# Patient Record
Sex: Male | Born: 1950 | Race: White | Hispanic: No | Marital: Married | Smoking: Never smoker
Health system: Southern US, Community
[De-identification: ages and names within clinical notes are randomized; demographics above are authoritative.]

## PROBLEM LIST (undated history)

## (undated) DIAGNOSIS — M205X9 Other deformities of toe(s) (acquired), unspecified foot: Secondary | ICD-10-CM

## (undated) DIAGNOSIS — H269 Unspecified cataract: Secondary | ICD-10-CM

## (undated) DIAGNOSIS — R972 Elevated prostate specific antigen [PSA]: Secondary | ICD-10-CM

## (undated) DIAGNOSIS — N529 Male erectile dysfunction, unspecified: Secondary | ICD-10-CM

## (undated) DIAGNOSIS — G8929 Other chronic pain: Secondary | ICD-10-CM

## (undated) DIAGNOSIS — M159 Polyosteoarthritis, unspecified: Secondary | ICD-10-CM

## (undated) DIAGNOSIS — I2510 Atherosclerotic heart disease of native coronary artery without angina pectoris: Secondary | ICD-10-CM

## (undated) DIAGNOSIS — T7840XA Allergy, unspecified, initial encounter: Secondary | ICD-10-CM

## (undated) DIAGNOSIS — I9789 Other postprocedural complications and disorders of the circulatory system, not elsewhere classified: Secondary | ICD-10-CM

## (undated) DIAGNOSIS — R7301 Impaired fasting glucose: Secondary | ICD-10-CM

## (undated) DIAGNOSIS — R5383 Other fatigue: Secondary | ICD-10-CM

## (undated) DIAGNOSIS — E785 Hyperlipidemia, unspecified: Secondary | ICD-10-CM

## (undated) DIAGNOSIS — E291 Testicular hypofunction: Secondary | ICD-10-CM

## (undated) DIAGNOSIS — N400 Enlarged prostate without lower urinary tract symptoms: Secondary | ICD-10-CM

## (undated) DIAGNOSIS — K635 Polyp of colon: Secondary | ICD-10-CM

## (undated) DIAGNOSIS — I214 Non-ST elevation (NSTEMI) myocardial infarction: Secondary | ICD-10-CM

## (undated) DIAGNOSIS — R7303 Prediabetes: Secondary | ICD-10-CM

## (undated) DIAGNOSIS — I4891 Unspecified atrial fibrillation: Secondary | ICD-10-CM

## (undated) DIAGNOSIS — M1990 Unspecified osteoarthritis, unspecified site: Secondary | ICD-10-CM

## (undated) DIAGNOSIS — M25561 Pain in right knee: Principal | ICD-10-CM

## (undated) DIAGNOSIS — G4730 Sleep apnea, unspecified: Secondary | ICD-10-CM

## (undated) DIAGNOSIS — K219 Gastro-esophageal reflux disease without esophagitis: Secondary | ICD-10-CM

## (undated) HISTORY — DX: Atherosclerotic heart disease of native coronary artery without angina pectoris: I25.10

## (undated) HISTORY — DX: Hyperlipidemia, unspecified: E78.5

## (undated) HISTORY — DX: Non-ST elevation (NSTEMI) myocardial infarction: I21.4

## (undated) HISTORY — DX: Other deformities of toe(s) (acquired), unspecified foot: M20.5X9

## (undated) HISTORY — DX: Male erectile dysfunction, unspecified: N52.9

## (undated) HISTORY — PX: COLONOSCOPY: SHX174

## (undated) HISTORY — DX: Allergy, unspecified, initial encounter: T78.40XA

## (undated) HISTORY — PX: FRACTURE SURGERY: SHX138

## (undated) HISTORY — PX: TONSILLECTOMY AND ADENOIDECTOMY: SUR1326

## (undated) HISTORY — DX: Other chronic pain: G89.29

## (undated) HISTORY — DX: Pain in right knee: M25.561

## (undated) HISTORY — PX: OTHER SURGICAL HISTORY: SHX169

## (undated) HISTORY — DX: Gastro-esophageal reflux disease without esophagitis: K21.9

## (undated) HISTORY — DX: Unspecified cataract: H26.9

## (undated) HISTORY — DX: Other fatigue: R53.83

## (undated) HISTORY — PX: EYE SURGERY: SHX253

## (undated) HISTORY — PX: UPPER GI ENDOSCOPY: SHX6162

## (undated) HISTORY — DX: Unspecified osteoarthritis, unspecified site: M19.90

## (undated) HISTORY — DX: Benign prostatic hyperplasia without lower urinary tract symptoms: N40.0

## (undated) HISTORY — DX: Sleep apnea, unspecified: G47.30

## (undated) HISTORY — DX: Prediabetes: R73.03

## (undated) HISTORY — DX: Other postprocedural complications and disorders of the circulatory system, not elsewhere classified: I97.89

## (undated) HISTORY — DX: Testicular hypofunction: E29.1

## (undated) HISTORY — PX: ORBITAL FRACTURE SURGERY: SHX725

## (undated) HISTORY — DX: Unspecified atrial fibrillation: I48.91

## (undated) HISTORY — DX: Polyp of colon: K63.5

## (undated) HISTORY — DX: Polyosteoarthritis, unspecified: M15.9

## (undated) HISTORY — DX: Impaired fasting glucose: R73.01

## (undated) HISTORY — DX: Elevated prostate specific antigen (PSA): R97.20

## (undated) HISTORY — PX: CARDIOVASCULAR STRESS TEST: SHX262

---
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
## 1998-02-02 ENCOUNTER — Ambulatory Visit (HOSPITAL_COMMUNITY): Admission: RE | Admit: 1998-02-02 | Discharge: 1998-02-02 | Payer: Self-pay | Admitting: Internal Medicine

## 1998-02-07 ENCOUNTER — Ambulatory Visit (HOSPITAL_COMMUNITY): Admission: RE | Admit: 1998-02-07 | Discharge: 1998-02-07 | Payer: Self-pay | Admitting: Internal Medicine

## 1999-06-26 ENCOUNTER — Ambulatory Visit (HOSPITAL_COMMUNITY): Admission: RE | Admit: 1999-06-26 | Discharge: 1999-06-26 | Payer: Self-pay | Admitting: Internal Medicine

## 1999-06-26 ENCOUNTER — Encounter: Payer: Self-pay | Admitting: Internal Medicine

## 1999-11-10 ENCOUNTER — Encounter: Payer: Self-pay | Admitting: Emergency Medicine

## 1999-11-10 ENCOUNTER — Emergency Department (HOSPITAL_COMMUNITY): Admission: EM | Admit: 1999-11-10 | Discharge: 1999-11-11 | Payer: Self-pay | Admitting: Emergency Medicine

## 1999-11-12 ENCOUNTER — Ambulatory Visit (HOSPITAL_BASED_OUTPATIENT_CLINIC_OR_DEPARTMENT_OTHER): Admission: RE | Admit: 1999-11-12 | Discharge: 1999-11-12 | Payer: Self-pay | Admitting: Orthopedic Surgery

## 2000-04-22 ENCOUNTER — Encounter (INDEPENDENT_AMBULATORY_CARE_PROVIDER_SITE_OTHER): Payer: Self-pay | Admitting: *Deleted

## 2000-06-02 ENCOUNTER — Ambulatory Visit (HOSPITAL_BASED_OUTPATIENT_CLINIC_OR_DEPARTMENT_OTHER): Admission: RE | Admit: 2000-06-02 | Discharge: 2000-06-02 | Payer: Self-pay | Admitting: Pulmonary Disease

## 2000-06-11 ENCOUNTER — Encounter: Payer: Self-pay | Admitting: Pulmonary Disease

## 2005-09-18 ENCOUNTER — Ambulatory Visit: Payer: Self-pay | Admitting: Internal Medicine

## 2005-09-24 ENCOUNTER — Ambulatory Visit: Payer: Self-pay | Admitting: Internal Medicine

## 2005-10-22 ENCOUNTER — Ambulatory Visit: Payer: Self-pay | Admitting: Internal Medicine

## 2006-01-08 ENCOUNTER — Ambulatory Visit: Payer: Self-pay | Admitting: Internal Medicine

## 2006-10-22 ENCOUNTER — Ambulatory Visit: Payer: Self-pay | Admitting: Internal Medicine

## 2006-10-22 LAB — CONVERTED CEMR LAB
ALT: 21 units/L (ref 0–40)
AST: 20 units/L (ref 0–37)
Cholesterol: 187 mg/dL (ref 0–200)
HDL: 44.3 mg/dL (ref >39.0)
Hgb A1c MFr Bld: 5.4 % (ref 4.6–6.0)
LDL Cholesterol: 106 mg/dL — ABNORMAL HIGH (ref 0–99)
Total CHOL/HDL Ratio: 4.2
Triglycerides: 186 mg/dL — ABNORMAL HIGH (ref 0–149)
VLDL: 37 mg/dL (ref 0–40)

## 2007-07-05 ENCOUNTER — Telehealth (INDEPENDENT_AMBULATORY_CARE_PROVIDER_SITE_OTHER): Payer: Self-pay | Admitting: *Deleted

## 2007-07-09 ENCOUNTER — Ambulatory Visit: Payer: Self-pay | Admitting: Internal Medicine

## 2007-07-09 DIAGNOSIS — T887XXA Unspecified adverse effect of drug or medicament, initial encounter: Secondary | ICD-10-CM | POA: Insufficient documentation

## 2007-07-17 LAB — CONVERTED CEMR LAB
ALT: 23 units/L (ref 0–53)
AST: 21 units/L (ref 0–37)
Albumin: 3.9 g/dL (ref 3.5–5.2)
Alkaline Phosphatase: 65 units/L (ref 39–117)
Bilirubin, Direct: 0.1 mg/dL (ref 0.0–0.3)
Cholesterol: 176 mg/dL (ref 0–200)
HDL: 37.9 mg/dL — ABNORMAL LOW (ref >39.0)
Hgb A1c MFr Bld: 5.4 % (ref 4.6–6.0)
LDL Cholesterol: 101 mg/dL — ABNORMAL HIGH (ref 0–99)
Total Bilirubin: 0.9 mg/dL (ref 0.3–1.2)
Total CHOL/HDL Ratio: 4.6
Total Protein: 6.8 g/dL (ref 6.0–8.3)
Triglycerides: 184 mg/dL — ABNORMAL HIGH (ref 0–149)
VLDL: 37 mg/dL (ref 0–40)

## 2007-07-19 ENCOUNTER — Encounter (INDEPENDENT_AMBULATORY_CARE_PROVIDER_SITE_OTHER): Payer: Self-pay | Admitting: *Deleted

## 2007-10-25 ENCOUNTER — Ambulatory Visit: Payer: Self-pay | Admitting: Internal Medicine

## 2007-10-25 ENCOUNTER — Emergency Department (HOSPITAL_COMMUNITY): Admission: EM | Admit: 2007-10-25 | Discharge: 2007-10-26 | Payer: Self-pay | Admitting: Emergency Medicine

## 2007-10-25 DIAGNOSIS — K219 Gastro-esophageal reflux disease without esophagitis: Secondary | ICD-10-CM

## 2007-10-25 DIAGNOSIS — E782 Mixed hyperlipidemia: Secondary | ICD-10-CM

## 2007-11-04 ENCOUNTER — Encounter: Payer: Self-pay | Admitting: Internal Medicine

## 2007-11-04 ENCOUNTER — Ambulatory Visit: Payer: Self-pay

## 2007-11-10 ENCOUNTER — Encounter (INDEPENDENT_AMBULATORY_CARE_PROVIDER_SITE_OTHER): Payer: Self-pay | Admitting: *Deleted

## 2007-11-22 ENCOUNTER — Ambulatory Visit: Payer: Self-pay | Admitting: Internal Medicine

## 2007-11-22 DIAGNOSIS — R350 Frequency of micturition: Secondary | ICD-10-CM

## 2007-11-22 DIAGNOSIS — R9439 Abnormal result of other cardiovascular function study: Secondary | ICD-10-CM | POA: Insufficient documentation

## 2007-11-24 ENCOUNTER — Encounter (INDEPENDENT_AMBULATORY_CARE_PROVIDER_SITE_OTHER): Payer: Self-pay | Admitting: *Deleted

## 2007-11-26 LAB — CONVERTED CEMR LAB
Hgb A1c MFr Bld: 5.6 % (ref 4.6–6.0)
PSA: 1.04 ng/mL (ref 0.10–4.00)

## 2007-11-29 ENCOUNTER — Encounter (INDEPENDENT_AMBULATORY_CARE_PROVIDER_SITE_OTHER): Payer: Self-pay | Admitting: *Deleted

## 2007-11-29 ENCOUNTER — Ambulatory Visit: Payer: Self-pay | Admitting: Cardiology

## 2008-04-18 ENCOUNTER — Encounter: Payer: Self-pay | Admitting: Internal Medicine

## 2008-08-16 ENCOUNTER — Telehealth (INDEPENDENT_AMBULATORY_CARE_PROVIDER_SITE_OTHER): Payer: Self-pay | Admitting: *Deleted

## 2008-08-18 ENCOUNTER — Ambulatory Visit: Payer: Self-pay | Admitting: Internal Medicine

## 2008-08-18 DIAGNOSIS — F528 Other sexual dysfunction not due to a substance or known physiological condition: Secondary | ICD-10-CM

## 2008-08-18 LAB — CONVERTED CEMR LAB
Bilirubin Urine: NEGATIVE
Blood in Urine, dipstick: NEGATIVE
Cholesterol, target level: 200 mg/dL
Glucose, Urine, Semiquant: NEGATIVE
HDL goal, serum: 40 mg/dL
Ketones, urine, test strip: NEGATIVE
LDL Goal: 130 mg/dL
Nitrite: NEGATIVE
Protein, U semiquant: NEGATIVE
Specific Gravity, Urine: <1.005
Urobilinogen, UA: NEGATIVE
WBC Urine, dipstick: NEGATIVE
pH: 7.5

## 2008-08-26 LAB — CONVERTED CEMR LAB
ALT: 21 units/L (ref 0–53)
AST: 21 units/L (ref 0–37)
Albumin: 4.2 g/dL (ref 3.5–5.2)
Alkaline Phosphatase: 64 units/L (ref 39–117)
BUN: 14 mg/dL (ref 6–23)
Basophils Absolute: 0 10*3/uL (ref 0.0–0.1)
Basophils Relative: 0 % (ref 0.0–3.0)
Bilirubin, Direct: 0.1 mg/dL (ref 0.0–0.3)
CO2: 28 meq/L (ref 19–32)
Calcium: 9.5 mg/dL (ref 8.4–10.5)
Chloride: 104 meq/L (ref 96–112)
Cholesterol: 226 mg/dL (ref 0–200)
Creatinine, Ser: 0.9 mg/dL (ref 0.4–1.5)
Direct LDL: 149.2 mg/dL
Eosinophils Absolute: 0.1 10*3/uL (ref 0.0–0.7)
Eosinophils Relative: 2.8 % (ref 0.0–5.0)
GFR calc Af Amer: 113 mL/min
GFR calc non Af Amer: 93 mL/min
Glucose, Bld: 99 mg/dL (ref 70–99)
HCT: 43.5 % (ref 39.0–52.0)
HDL: 48.2 mg/dL (ref >39.0)
Hemoglobin: 15.1 g/dL (ref 13.0–17.0)
Lymphocytes Relative: 40 % (ref 12.0–46.0)
MCHC: 34.7 g/dL (ref 30.0–36.0)
MCV: 94.7 fL (ref 78.0–100.0)
Monocytes Absolute: 0.4 10*3/uL (ref 0.1–1.0)
Monocytes Relative: 8.1 % (ref 3.0–12.0)
Neutro Abs: 2.5 10*3/uL (ref 1.4–7.7)
Neutrophils Relative %: 49.1 % (ref 43.0–77.0)
PSA: 1.14 ng/mL (ref 0.10–4.00)
Platelets: 170 10*3/uL (ref 150–400)
Potassium: 4.4 meq/L (ref 3.5–5.1)
RBC: 4.59 M/uL (ref 4.22–5.81)
RDW: 11.9 % (ref 11.5–14.6)
Sodium: 139 meq/L (ref 135–145)
TSH: 1.1 microintl units/mL (ref 0.35–5.50)
Total Bilirubin: 0.8 mg/dL (ref 0.3–1.2)
Total CHOL/HDL Ratio: 4.7
Total Protein: 6.9 g/dL (ref 6.0–8.3)
Triglycerides: 152 mg/dL — ABNORMAL HIGH (ref 0–149)
VLDL: 30 mg/dL (ref 0–40)
WBC: 5 10*3/uL (ref 4.5–10.5)

## 2008-08-29 ENCOUNTER — Encounter (INDEPENDENT_AMBULATORY_CARE_PROVIDER_SITE_OTHER): Payer: Self-pay | Admitting: *Deleted

## 2009-03-20 ENCOUNTER — Ambulatory Visit: Payer: Self-pay | Admitting: Internal Medicine

## 2009-03-26 ENCOUNTER — Encounter (INDEPENDENT_AMBULATORY_CARE_PROVIDER_SITE_OTHER): Payer: Self-pay | Admitting: *Deleted

## 2009-06-15 ENCOUNTER — Ambulatory Visit: Payer: Self-pay | Admitting: Internal Medicine

## 2009-06-19 ENCOUNTER — Encounter (INDEPENDENT_AMBULATORY_CARE_PROVIDER_SITE_OTHER): Payer: Self-pay | Admitting: *Deleted

## 2009-06-19 ENCOUNTER — Telehealth (INDEPENDENT_AMBULATORY_CARE_PROVIDER_SITE_OTHER): Payer: Self-pay | Admitting: *Deleted

## 2009-06-19 LAB — CONVERTED CEMR LAB
ALT: 43 units/L (ref 0–53)
AST: 29 units/L (ref 0–37)
Albumin: 4.1 g/dL (ref 3.5–5.2)
Alkaline Phosphatase: 71 units/L (ref 39–117)
Bilirubin, Direct: 0 mg/dL (ref 0.0–0.3)
Cholesterol: 138 mg/dL (ref 0–200)
HDL: 43.3 mg/dL (ref >39.00)
LDL Cholesterol: 65 mg/dL (ref 0–99)
Total Bilirubin: 0.8 mg/dL (ref 0.3–1.2)
Total CHOL/HDL Ratio: 3
Total Protein: 6.6 g/dL (ref 6.0–8.3)
Triglycerides: 151 mg/dL — ABNORMAL HIGH (ref 0.0–149.0)
VLDL: 30.2 mg/dL (ref 0.0–40.0)

## 2009-10-22 ENCOUNTER — Ambulatory Visit: Payer: Self-pay | Admitting: Internal Medicine

## 2009-10-22 DIAGNOSIS — R21 Rash and other nonspecific skin eruption: Secondary | ICD-10-CM

## 2009-10-26 ENCOUNTER — Telehealth (INDEPENDENT_AMBULATORY_CARE_PROVIDER_SITE_OTHER): Payer: Self-pay | Admitting: *Deleted

## 2010-01-22 ENCOUNTER — Ambulatory Visit: Payer: Self-pay | Admitting: Internal Medicine

## 2010-01-22 DIAGNOSIS — B009 Herpesviral infection, unspecified: Secondary | ICD-10-CM | POA: Insufficient documentation

## 2010-01-22 DIAGNOSIS — M2550 Pain in unspecified joint: Secondary | ICD-10-CM | POA: Insufficient documentation

## 2010-01-24 LAB — CONVERTED CEMR LAB
Rhuematoid fact SerPl-aCnc: <20 intl units/mL (ref 0.0–20.0)
Sed Rate: 1 mm/hr (ref 0–22)

## 2010-07-05 ENCOUNTER — Ambulatory Visit: Payer: Self-pay | Admitting: Internal Medicine

## 2010-09-26 ENCOUNTER — Telehealth (INDEPENDENT_AMBULATORY_CARE_PROVIDER_SITE_OTHER): Payer: Self-pay | Admitting: *Deleted

## 2010-10-06 LAB — CONVERTED CEMR LAB
BUN: 16 mg/dL (ref 6–23)
Basophils Absolute: 0 10*3/uL (ref 0.0–0.1)
Basophils Relative: 0.9 % (ref 0.0–3.0)
CO2: 29 meq/L (ref 19–32)
Calcium: 9.4 mg/dL (ref 8.4–10.5)
Chloride: 108 meq/L (ref 96–112)
Creatinine, Ser: 0.9 mg/dL (ref 0.4–1.5)
Eosinophils Absolute: 0.2 10*3/uL (ref 0.0–0.7)
Eosinophils Relative: 3.4 % (ref 0.0–5.0)
GFR calc non Af Amer: 92.83 mL/min (ref >60)
Glucose, Bld: 88 mg/dL (ref 70–99)
HCT: 43.8 % (ref 39.0–52.0)
Hemoglobin: 15.2 g/dL (ref 13.0–17.0)
Lymphocytes Relative: 38 % (ref 12.0–46.0)
Lymphs Abs: 2.1 10*3/uL (ref 0.7–4.0)
MCHC: 34.8 g/dL (ref 30.0–36.0)
MCV: 93 fL (ref 78.0–100.0)
Monocytes Absolute: 0.6 10*3/uL (ref 0.1–1.0)
Monocytes Relative: 11.7 % (ref 3.0–12.0)
Neutro Abs: 2.5 10*3/uL (ref 1.4–7.7)
Neutrophils Relative %: 46 % (ref 43.0–77.0)
Platelets: 191 10*3/uL (ref 150.0–400.0)
Potassium: 4.6 meq/L (ref 3.5–5.1)
RBC: 4.71 M/uL (ref 4.22–5.81)
RDW: 12.1 % (ref 11.5–14.6)
Sodium: 143 meq/L (ref 135–145)
TSH: 1.21 microintl units/mL (ref 0.35–5.50)
WBC: 5.4 10*3/uL (ref 4.5–10.5)

## 2010-10-08 NOTE — Progress Notes (Signed)
Summary: questions about rash  Phone Note Call from Patient   Caller: Patient Summary of Call: patient left msg on voicemail that medication he received for rash is helping but noticed that one of the side effects was that some redness will occur and that it also is itching alittle more.  Follow-up for Phone Call        spoke w/ patient informed that if medication is helping no need ofr concern and for itching periodically he should take a benadryl but if no better contact office........Marland KitchenDoristine Devoid  October 26, 2009 11:36 AM

## 2010-10-08 NOTE — Letter (Signed)
Summary: Results Follow up Letter  [facility]  [address] [place], [place] [address]   Phone: [phone]  Fax: [phone]    08/29/2008 MRN: [id number]  Tyler [name] [address] [place], [place]  [address]  Dear Mr. Bohne,  The following are the results of your recent test(s):  Test         Result    Pap Smear:        Normal _____  Not Normal _____ Comments: ______________________________________________________ Cholesterol: LDL(Bad cholesterol):         Your goal is less than:         HDL (Good cholesterol):       Your goal is more than: Comments:  ______________________________________________________ Mammogram:        Normal _____  Not Normal _____ Comments:  ___________________________________________________________________ Hemoccult:        Normal _____  Not normal _______ Comments:    _____________________________________________________________________ Other Tests: PLEASE SEE ATTACHED LABS DONE ON 08/18/2008    We routinely do not discuss normal results over the telephone.  If you desire a copy of the results, or you have any questions about this information we can discuss them at your next office visit.   Sincerely,

## 2010-10-08 NOTE — Assessment & Plan Note (Signed)
Summary: rash on neck - Tyler [name]   Vital Signs:  Patient profile:   60 year old male Weight:      214 pounds Temp:     98.5 degrees F oral Pulse rate:   64 / minute Resp:     14 per minute BP sitting:   110 / 68  (left arm) Cuff size:   large  Vitals Entered By: Shonna Chock (October 22, 2009 10:48 AM) CC: Rash on neck x 4-5 weeks, no change in daily routine Comments REVIEWED MED LIST, PATIENT AGREED DOSE AND INSTRUCTION CORRECT    CC:  Rash on neck x 4-5 weeks and no change in daily routine.  History of Present Illness: Rash ?ringworm since early 09/2009 with mild itching above rash.Rx: antifungal cream with minimal benefit. No pets in house., but 1 week prior to rash a dog jumped up on him & he petted him  Allergies: 1)  ! Sulfa  Review of Systems General:  Denies chills, fever, sweats, and weight loss. Derm:  Denies changes in color of skin.  Physical Exam  General:  well-nourished; alert,appropriate and cooperative throughout examination Skin:  4X4 cm scaley , dry  rash L neck with 1X1.5 cm accessory lesion. Slight serpinginous border  Cervical Nodes:  No lymphadenopathy noted Axillary Nodes:  No palpable lymphadenopathy   Impression & Recommendations:  Problem # 1:  RASH-NONVESICULAR (ICD-782.1) Ringworm suggested   Complete Medication List: 1)  Prilosec Otc 20 Mg Tbec (Omeprazole magnesium) .[name].. 1 by mouth qd 2)  Baby Asa 81mg   .... Qd 3)  Multivitamin  4)  Fish Oil 1-2 Daily  5)  Viagra 100 Mg Tabs (Sildenafil citrate) .... 1/2-1 at bedtime 6)  Crestor 20 Mg Tabs (Rosuvastatin calcium) .[name].. 1 once daily (fasting labs after 10 weeks)  Patient Instructions: 1)  Nizoral applied two times a day & blown dry until rash resolved X 1 week.

## 2010-10-08 NOTE — Assessment & Plan Note (Signed)
Summary: flu shot///sph--told him 10:30 instead of 10:15//sph   Nurse Visit  CC: Flu shot./kb   Allergies: 1)  ! Sulfa  Orders Added: 1)  Admin 1st Vaccine [90471] 2)  Flu Vaccine 52yrs + [56387]         Flu Vaccine Consent Questions     Do you have a history of severe allergic reactions to this vaccine? no    Any prior history of allergic reactions to egg and/or gelatin? no    Do you have a sensitivity to the preservative Thimersol? no    Do you have a past history of Guillan-Barre Syndrome? no    Do you currently have an acute febrile illness? no    Have you ever had a severe reaction to latex? no    Vaccine information given and explained to patient? yes    Are you currently pregnant? no    Lot Number:[id number]   Exp Date:03/08/2011   Site Given  Left Deltoid IMu

## 2010-10-08 NOTE — Assessment & Plan Note (Signed)
Summary: rash on neck/cbs   Vital Signs:  Patient profile:   60 year old male Weight:      215.6 pounds Temp:     98.5 degrees F oral Pulse rate:   72 / minute Resp:     14 per minute BP sitting:   122 / 78  (left arm) Cuff size:   large  Vitals Entered By: Shonna Chock (Jan 22, 2010 8:15 AM) CC: Rash  on neck and red raised area on head Comments REVIEWED MED LIST, PATIENT AGREED DOSE AND INSTRUCTION CORRECT    CC:  Rash  on neck and red raised area on head.  History of Present Illness: The "ringworm " had responded to the cream on 3 occasions with good result. 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 In late 08/2009 a dog actually jumped on him ; rash on L neck developed in mid 09/2009. No family with similar rashes. Rash appeared  on R neck in mid 12/2009 & again was responsive to cream.L forehead lesion present  X 1 week. This is a recurrent lesion in same area which  always starts as small blisters & always  associated with some bowel dysfunction, mainly constipation. Abreva topically  has helped these lesions.New type of  lesion R forearm 2-3 weeks ago which is  pruritic.No PMH or FH of psoriasis. His bro has RA.  Allergies: 1)  ! Sulfa  Review of Systems General:  Denies chills, fever, sweats, and weight loss. GI:  Complains of indigestion; denies abdominal pain and diarrhea. MS:  Complains of joint pain and low back pain; denies mid back pain, muscle aches, cramps, muscle weakness, and thoracic pain; Occasional LBP & knee pain. Knee pain prompted him to leave [employer] @ [facility]. Derm:  Complains of changes in color of skin and lesion(s); denies changes in nail beds and hair loss; Lesions are pink with white nodule .  Physical Exam  General:  well-nourished; alert,appropriate and cooperative throughout examination Msk:  No deformity or scoliosis noted of thoracic or lumbar spine.   Extremities:  Mild crepitus of knees. Flexion contracture of 5th  R finger Neurologic:  alert & oriented X3.   Skin:  6X7 dry, irregular  red lesion L forehead. Excoriated  6X1 lesion R forearm. 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 fungal dermatitis , presently inactive  Problem # 2:  ARTHRALGIA (ICD-719.40)  FH RA  Orders: Venipuncture (60454) TLB-Rheumatoid Factor (RA) (09811-BJ) TLB-Sedimentation Rate (ESR) (85652-ESR)  Problem # 3:  RASH-NONVESICULAR (ICD-782.1) clinically "ringworm"; presently quiescent but recurrent by history. Scraping @ next recurrence recommended for definitive diagnosis  Complete Medication List: 1)  Prilosec Otc 20 Mg Tbec (Omeprazole magnesium) .[name].. 1 by mouth qd 2)  Baby Asa 81mg   .... Qd 3)  Multivitamin  4)  Fish Oil 1-2 Daily  5)  Viagra 100 Mg Tabs (Sildenafil citrate) .... 1/2-1 at bedtime 6)  Crestor 20 Mg Tabs (Rosuvastatin calcium) .[name].. 1 once daily (fasting labs after 10 weeks) 7)  Famciclovir 500 Mg Tabs (Famciclovir) .... 2 pills two times a day x 1 day as needed for recurrences 8)  Tramadol Hcl 50 Mg Tabs (Tramadol hcl) .[name].. 1 every 6 hrs as needed joint pain  Patient Instructions: 1)  Famcyclovir 500 mg 2 two times a day as needed for simplex recurrence. Keep Diary of recurrences of "blisters" on forehead  to determine need for suppressive therapy. Have recurrences of "ringworm" scraped for fungal studies. Prescriptions: TRAMADOL HCL 50 MG TABS (TRAMADOL HCL) 1 every 6 hrs as needed joint pain  #30 x 1   Entered and Authorized by:   Marga Melnick MD   Signed by:   Marga Melnick MD on 01/22/2010   Method used:   Print then Give to Patient   RxID:   [id number] FAMCICLOVIR 500 MG TABS (FAMCICLOVIR) 2 pills two times a day X 1 day as needed for recurrences  #20 x 0    Entered and Authorized by:   Marga Melnick MD   Signed by:   Marga Melnick MD on 01/22/2010   Method used:   Print then Give to Patient   RxID:   [id number]

## 2010-10-10 NOTE — Progress Notes (Signed)
Summary: RX  Phone Note Refill Request Call back at [phone]   Refills Requested: Medication #1:  CRESTOR 20 MG TABS 1 once daily (fasting labs after 10 weeks) [BMN]   Dosage confirmed as above?Dosage Confirmed   Supply Requested: 1 month PT HAS  AN APPT SCHED NEED A REFILL ON CRESTOR [facility] [place] [facility] [address]  Initial call taken by: Freddy Jaksch,  September 26, 2010 1:42 PM    Prescriptions: CRESTOR 20 MG TABS (ROSUVASTATIN CALCIUM) 1 once daily (fasting labs after 10 weeks) Brand medically necessary #30 x 0   Entered by:   Shonna Chock CMA   Authorized by:   Marga Melnick MD   Signed by:   Shonna Chock CMA on 09/26/2010   Method used:   [facility]        [facility]  [place] [address] [facility]       [address]       [place], [place]  [address]       Ph: [phone] or [phone]       Fax: [phone]   RxID:   [id number]

## 2010-10-18 ENCOUNTER — Ambulatory Visit (INDEPENDENT_AMBULATORY_CARE_PROVIDER_SITE_OTHER): Payer: Managed Care, Other (non HMO) | Admitting: Internal Medicine

## 2010-10-18 ENCOUNTER — Other Ambulatory Visit: Payer: Self-pay | Admitting: Internal Medicine

## 2010-10-18 ENCOUNTER — Encounter: Payer: Self-pay | Admitting: Internal Medicine

## 2010-10-18 DIAGNOSIS — J45990 Exercise induced bronchospasm: Secondary | ICD-10-CM | POA: Insufficient documentation

## 2010-10-18 DIAGNOSIS — G4730 Sleep apnea, unspecified: Secondary | ICD-10-CM | POA: Insufficient documentation

## 2010-10-18 DIAGNOSIS — Z0000 Encounter for general adult medical examination without abnormal findings: Secondary | ICD-10-CM

## 2010-10-18 DIAGNOSIS — Z23 Encounter for immunization: Secondary | ICD-10-CM

## 2010-10-18 DIAGNOSIS — J309 Allergic rhinitis, unspecified: Secondary | ICD-10-CM

## 2010-10-18 DIAGNOSIS — E782 Mixed hyperlipidemia: Secondary | ICD-10-CM

## 2010-10-18 LAB — LIPID PANEL
Cholesterol: 136 mg/dL (ref 0–200)
Triglycerides: 88 mg/dL (ref 0.0–149.0)
VLDL: 17.6 mg/dL (ref 0.0–40.0)

## 2010-10-18 LAB — HEPATIC FUNCTION PANEL
ALT: 22 U/L (ref 0–53)
Albumin: 4 g/dL (ref 3.5–5.2)
Total Bilirubin: 0.7 mg/dL (ref 0.3–1.2)
Total Protein: 6.7 g/dL (ref 6.0–8.3)

## 2010-10-18 LAB — BASIC METABOLIC PANEL
BUN: 18 mg/dL (ref 6–23)
GFR: 99.96 mL/min (ref >60.00)
Glucose, Bld: 79 mg/dL (ref 70–99)
Potassium: 4.2 mEq/L (ref 3.5–5.1)

## 2010-10-18 LAB — CBC WITH DIFFERENTIAL/PLATELET
Basophils Absolute: 0 10*3/uL (ref 0.0–0.1)
HCT: 42.7 % (ref 39.0–52.0)
Lymphs Abs: 2.5 10*3/uL (ref 0.7–4.0)
MCV: 91.9 fl (ref 78.0–100.0)
Monocytes Absolute: 0.5 10*3/uL (ref 0.1–1.0)
Monocytes Relative: 8.7 % (ref 3.0–12.0)
Platelets: 183 10*3/uL (ref 150.0–400.0)
RDW: 12.9 % (ref 11.5–14.6)

## 2010-10-18 LAB — PSA: PSA: 1.43 ng/mL (ref 0.10–4.00)

## 2010-10-18 LAB — TSH: TSH: 2.15 u[IU]/mL (ref 0.35–5.50)

## 2010-10-24 NOTE — Assessment & Plan Note (Signed)
Summary: med refill/ph   Vital Signs:  Patient profile:   60 year old male Height:      71.5 inches Weight:      207.2 pounds BMI:     28.60 Temp:     98.0 degrees F oral Pulse rate:   72 / minute Resp:     14 per minute BP sitting:   122 / 80  (left arm) Cuff size:   large  Vitals Entered By: Shonna Chock CMA (October 18, 2010 11:23 AM) CC: CPX with fasting labs , Heartburn, Lipid Management   CC:  CPX with fasting labs , Heartburn, and Lipid Management.  History of Present Illness:    [name] is here for a physical; he is essentially asymptomatic.    Hyperlipidemia Follow-Up:   The patient denies muscle aches, GI upset, abdominal pain, flushing, itching, constipation, diarrhea, and fatigue.  Other symptoms include exercise intolerance  as EIB symptoms for first 1/4 mile since high school, especailly in cold .  The patient denies the following symptoms: chest pain/pressure, dypsnea, palpitations, syncope, and pedal edema.  Compliance with medications (by patient report) has been near 100%.  Dietary compliance has been fair.  The patient reports exercising 2 X per week.  Adjunctive measures currently used by the patient include fiber, fish oil supplements, and Co-Q 10.                                                   GERD F/U:  The patient denies acid reflux, sour taste in mouth, epigastric pain, trouble swallowing, weight loss, and weight gain.  The patient denies the following alarm features: melena, dysphagia, hematemesis, and vomiting.  The patient has found the following treatments to be effective: a PPI, Omeprazole once daily .      Lipid Management History:      Positive NCEP/ATP III risk factors include male age 45 years old or older.  Negative NCEP/ATP III risk factors include non-diabetic, no family history for ischemic heart disease, non-tobacco-user status, non-hypertensive, no ASHD (atherosclerotic heart disease), no prior stroke/TIA, no peripheral vascular disease,  and no history of aortic aneurysm.     Preventive Screening-Counseling & Management  Alcohol-Tobacco     Smoking Status: never  Allergies: 1)  ! Sulfa  Past History:  Past Medical History: Hosptalized @ age 69 with T to 107,"viral infection" Esophageal stricture, PMH of X 2 HYPERLIPIDEMIA (ICD-272.2): Framingham Study LDL goal = < 160. NMR lipoprofile  LDL goal = < 130. Sulfa rash GERD EIB   Past Surgical History: Inguinal herniorrhaphy; Hydrocoelectomy  @ age 25;Esophageal dilation X 2; Colonoscopy negative, Dr Marina Goodell ; Tonsillectomy; Surgery for OD Orbital Fracture 1984  Family History: maternal grandfather: MI @ ? 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         Chronic throat congestion & PNDrainage Resp:  Complains of excessive snoring, hypersomnolence, and morning headaches; Sleep Apnea evaluation could not be competed as he  could not sleep.  Physical Exam  General:  well-nourished,in no acute distress; alert,appropriate and cooperative throughout examination Head:  Normocephalic and atraumatic without obvious abnormalities. Eyes:  No corneal or conjunctival inflammation noted. [name] Perrla. Funduscopic exam benign, without hemorrhages, exudates or papilledema. Ears:  External ear exam shows no significant lesions or deformities.  Otoscopic examination reveals clear canals, tympanic membranes are intact bilaterally without bulging, retraction, inflammation or discharge. Hearing is grossly normal bilaterally. Nose:   External nasal examination shows no deformity or inflammation. Nasal mucosa are pink and moist without lesions or exudates. Septal dislocation Mouth:  Oral mucosa and oropharynx without lesions or exudates.  Teeth in good repair. Oropharynx crowded Neck:  No deformities, masses, or tenderness noted. Lungs:  Normal respiratory effort, chest expands symmetrically. Lungs are clear to auscultation, no crackles or wheezes. Heart:  Normal rate and regular rhythm. S1 and S2 normal without gallop, murmur, click, rub . S4 with slight slurring @ base Abdomen:  Bowel sounds positive,abdomen soft and non-tender without masses, organomegaly or hernias noted. Rectal:  No external abnormalities noted. Normal sphincter tone. No rectal masses or tenderness. Genitalia:  Testes bilaterally descended without nodularity, tenderness or masses. No scrotal masses or lesions. No penis lesions or urethral discharge. Minimal size asymmetry of testes Prostate:  Prostate gland firm and smooth, no enlargement, nodularity, tenderness, mass, asymmetry or induration. Msk:  No deformity or scoliosis noted of thoracic or lumbar spine.   Pulses:  R and L carotid,radial,dorsalis pedis and posterior tibial pulses are full and equal bilaterally Extremities:  No clubbing, cyanosis, edema. Flexion contracture R 5th finger  Neurologic:  alert & oriented X3 and DTRs symmetrical and normal.   Skin:  Intact without suspicious lesions or rashes Cervical Nodes:  No lymphadenopathy noted Axillary Nodes:  No palpable lymphadenopathy Inguinal Nodes:  No significant adenopathy Psych:  memory intact for recent and remote, normally interactive, and good eye contact.     Impression & Recommendations:  Problem # 1:  ROUTINE GENERAL MEDICAL EXAM@HEALTH  CARE FACL (ICD-V70.0)  Orders: EKG w/ Interpretation (93000) Venipuncture (24401) TLB-Lipid Panel (80061-LIPID) TLB-BMP (Basic Metabolic Panel-BMET) (80048-METABOL) TLB-CBC Platelet -  w/Differential (85025-CBCD) TLB-Hepatic/Liver Function Pnl (80076-HEPATIC) TLB-TSH (Thyroid Stimulating Hormone) (84443-TSH) TLB-PSA (Prostate Specific Antigen) (84153-PSA)  Problem # 2:  HYPERLIPIDEMIA (ICD-272.2)  His updated medication list for this problem includes:    Crestor 20 Mg Tabs (Rosuvastatin calcium) .[name]... 1 once daily  Problem # 3:  GERD (ICD-530.1) controlled His updated medication list for this problem includes:    Prilosec Otc 20 Mg Tbec (Omeprazole magnesium) .[name]... 1 by mouth qd  Problem # 4:  SLEEP APNEA (ICD-780.57)  Orders: Sleep Disorder Referral (Sleep Disorder)  Problem # 5:  EXERCISE INDUCED BRONCHOSPASM (ICD-493.81) Albuteral MDI declined  Problem # 6:  RHINITIS (ICD-477.9)  His updated medication list for this problem includes:    Fluticasone Propionate 50 Mcg/act Susp (Fluticasone propionate) .[name]... 1 spray two times a day as needed  Complete Medication List: 1)  Prilosec Otc 20 Mg Tbec (Omeprazole magnesium) .[name].. 1 by mouth qd 2)  Multivitamin  3)  Fish Oil 1-2 Daily  4)  Viagra 100 Mg Tabs (Sildenafil citrate) .... 1/2-1 at bedtime 5)  Crestor 20 Mg Tabs (Rosuvastatin calcium) .[name].. 1 once daily 6)  Fluticasone Propionate 50 Mcg/act Susp (Fluticasone propionate) .[name].. 1 spray two times a day as needed  Other Orders: Tdap => 24yrs IM (02725) Admin 1st Vaccine (36644)  Lipid Assessment/Plan:      Based on NCEP/ATP  III, the patient's risk factor category is "0-1 risk factors".  The patient's lipid goals are as follows: Total cholesterol goal is 200; LDL cholesterol goal is 130; HDL cholesterol goal is 40; Triglyceride goal is 150.  His LDL cholesterol goal has been met.    Patient Instructions: 1)  Meds will be be adjusted  based on lab results. Prescriptions: FLUTICASONE PROPIONATE 50 MCG/ACT SUSP (FLUTICASONE PROPIONATE) 1 spray two times a day as needed  #1 x 11   Entered and Authorized by:   Marga Melnick MD   Signed by:   Marga Melnick MD  on 10/18/2010   Method used:   [facility]  [place] [address]       [address]       [place], [place]  [address]       Ph: [phone] or [phone]       Fax: [phone]   RxID:   [id number]    Orders Added: 1)  Tdap => 52yrs IM [90715] 2)  Admin 1st Vaccine [90471] 3)  Est. Patient 40-64 years [99396] 4)  EKG w/ Interpretation [93000] 5)  Venipuncture [36415] 6)  TLB-Lipid Panel [80061-LIPID] 7)  TLB-BMP (Basic Metabolic Panel-BMET) [80048-METABOL] 8)  TLB-CBC Platelet - w/Differential [85025-CBCD] 9)  TLB-Hepatic/Liver Function Pnl [80076-HEPATIC] 10)  TLB-TSH (Thyroid Stimulating Hormone) [84443-TSH] 11)  TLB-PSA (Prostate Specific Antigen) [41660-YTK] 12)  Sleep Disorder Referral [Sleep Disorder]   Immunizations Administered:  Tetanus Vaccine:    Vaccine Type: Tdap    Site: right deltoid    Mfr: GlaxoSmithKline    Dose: 0.5 ml    Route: IM    Given by: Shonna Chock CMA    Exp. Date: 06/27/2012    Lot #: [id number]    VIS given: 07/26/08 version given October 18, 2010.   Immunizations Administered:  Tetanus Vaccine:    Vaccine Type: Tdap    Site: right deltoid    Mfr: GlaxoSmithKline    Dose: 0.5 ml    Route: IM    Given by: Shonna Chock CMA    Exp. Date: 06/27/2012    Lot #: [id number]    VIS given: 07/26/08 version given October 18, 2010.   Appended Document: med refill/ph

## 2010-11-08 ENCOUNTER — Encounter: Payer: Self-pay | Admitting: Pulmonary Disease

## 2010-11-08 ENCOUNTER — Institutional Professional Consult (permissible substitution) (INDEPENDENT_AMBULATORY_CARE_PROVIDER_SITE_OTHER): Payer: Managed Care, Other (non HMO) | Admitting: Pulmonary Disease

## 2010-11-08 DIAGNOSIS — G4710 Hypersomnia, unspecified: Secondary | ICD-10-CM | POA: Insufficient documentation

## 2010-11-19 NOTE — Assessment & Plan Note (Signed)
Summary: consult for possible osa   Copy to:  Marga Melnick Primary Provider/Referring Provider:  Marga Melnick MD  CC:  Sleep Consult.  Pt states his wife states pt will stop breathing at night while he is sleeping.  Pt also snores loudly while sleeping.  [name]  History of Present Illness: The pt is a 59y/o male who I have been asked to see for possible osa.  He has been noted to have loud snoring, as well as an abnormal breathing pattern during sleep by his wife.  He goes to bed btw 11p-1a, and arises at 6am to start his day.  He has awakenings during the night for unknown reasons, and initialy feels rested upon arising only to be sleepy an hour later.  He notes definite sleep pressure during the day with quiet tasks, and will get sleepy watching tv in the evening.  He also notes sleep pressure with driving, and will stop for a "catnap".  The pt states his weight is neutral over the last few yrs, and his epworth score today is 16.  Of note, the pt states he had a sleep study in 2003 which did not show osa, but he felt he did not sleep during the night.    Current Medications (verified): 1)  Prilosec Otc 20 Mg  Tbec (Omeprazole Magnesium) .[name].. 1 By Mouth Qd 2)  Multivitamin 3)  Fish Oil 1-2 Daily 4)  Viagra 100 Mg Tabs (Sildenafil Citrate) .... 1/2-1 At Bedtime 5)  Fluticasone Propionate 50 Mcg/act Susp (Fluticasone Propionate) .[name].. 1 Spray Two Times A Day As Needed 6)  Pravastatin Sodium 40 Mg Tabs (Pravastatin Sodium) .[name].. 1 At Bedtime  Allergies (verified): 1)  ! Sulfa  Past History:  Past Medical History: Reviewed history from 10/18/2010 and no changes required. Hosptalized @ age 89 with T to 107,"viral infection" Esophageal stricture, PMH of X 2 HYPERLIPIDEMIA (ICD-272.2): Framingham Study LDL goal = < 160. NMR lipoprofile  LDL goal = < 130. Sulfa rash GERD EIB   Past Surgical History: Inguinal herniorrhaphy; Hydrocoelectomy  @ age 41;Esophageal dilation X 2; Colonoscopy  negative, Dr Marina Goodell ; Tonsillectomy and adenoidectomy; Surgery for OD Orbital Fracture 1984  Family History: Reviewed history from 10/18/2010 and no changes required. maternal grandfather: MI @ ? paternal grandfather: MI  in 41s; mother: copd/emphysema, allergies, asthma, cervical cancer brother: asthma, rheumatism sister: asthma father: CHF, MI in 39's maternal grandmother: cva brother: RA; no males with MI pre 7 or females pre 70  Social History: Reviewed history from 10/18/2010 and no changes required. 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 and lives with wife. pt has chidlren.  Review of Systems       The patient complains of shortness of breath with activity and nasal congestion/difficulty breathing through nose.  The patient denies shortness of breath at rest, productive cough, non-productive cough, coughing up blood, chest pain, irregular heartbeats, acid heartburn, indigestion, loss of appetite, weight change, abdominal pain, difficulty swallowing, sore throat, tooth/dental problems, headaches, sneezing, itching, ear ache, anxiety, depression, hand/feet swelling, joint stiffness or pain, rash, change in color of mucus, and fever.    Vital Signs:  Patient profile:   60 year old male Height:      71.5 inches Weight:      209 pounds BMI:     28.85 O2 Sat:      96 % on Room air Temp:     97.8 degrees F oral Pulse rate:   62 /  minute BP sitting:   112 / 78  (left arm) Cuff size:   regular  Vitals Entered By: Arman Filter LPN (November 08, 270 1:36 PM)  O2 Flow:  Room air CC: Sleep Consult.  Pt states his wife states pt will stop breathing at night while he is sleeping.  Pt also snores loudly while sleeping.   Comments Medications reviewed with patient Arman Filter LPN  November 07, 5364 1:37 PM    Physical Exam  General:  ow male in nad Eyes:  PERRLA and EOMI.   Nose:  erythematous mucosa , swollen turbs,  narrowing bilat L>>R Mouth:  elongation of soft palate and uvula  Neck:  no jvd, tmg, LN Lungs:  clear to auscultation Heart:  rrr, no mrg  Abdomen:  soft and nontender, bs + Extremities:  no edema or cyanosis pulses intact distally Neurologic:  awake, but does appear mildly sleepy moves all 4    Impression & Recommendations:  Problem # 1:  HYPERSOMNIA (ICD-780.54) the pt has significant daytime sleepiness that I suspect is due to sleep disordered breathing given his history.  He has had a sleep study in the distant past, but was unable to go to sleep because of all of the monitoring wires (per his history).  At this point, will need a repeat study for evaluation.  Given his issues in the sleep center in the past, and also the fact he has no significant cardiopulmonary disease, I think he would do better with home sleep testing.  The pt agrees.       Other Orders: Consultation Level IV (44034) Misc. Referral (Misc. Ref)  Patient Instructions: 1)  will set up for home sleep testing.  Will call you with results when available.

## 2010-11-26 NOTE — Consult Note (Signed)
Summary: [facility]  [facility]   Imported By: Lester [place] 11/19/2010 09:40:43  _____________________________________________________________________  External Attachment:    Type:   Image     Comment:   External Document

## 2011-01-21 NOTE — Discharge Summary (Signed)
NAMEOLA, FAWVER            ACCOUNT NO.:  [id number]   MEDICAL RECORD NO.:  [id number]          PATIENT TYPE:  OBV   LOCATION:  1830                         FACILITY:  [facility]   PHYSICIAN:  Valerie A. Felicity Coyer, MDDATE OF BIRTH:  1951/07/13   DATE OF ADMISSION:  10/25/2007  DATE OF DISCHARGE:  10/26/2007                               DISCHARGE SUMMARY   DISCHARGE DIAGNOSES:  1. Atypical chest pain.  2. Hyperlipidemia.  3. Gastroesophageal reflux disease.   HISTORY OF PRESENT ILLNESS:  Mr. Troia is a 60 year old male, who  was admitted on October 25, 2007, with chief complaint of chest pain.  He noted that he got upset at work just before lunch and had some  disorientation and dizziness as well as pain in the back of his left  shoulder and a brief episode of shortness of breath.  He denies sweats  and noted a dull chest pain.  He noted that the pain was located in the  left anterior chest and it radiated to the left shoulder and back.  The  pain was apparently made worse by emotional stress and improved with  rest.  He was admitted for further evaluation and treatment.   PAST MEDICAL HISTORY:  1. Dyslipidemia.  2. Adverse medication effect.  3. Esophageal stricture x2.   COURSE OF HOSPITALIZATION BY PROBLEM:  Chest pain:  The patient was  admitted and underwent serial cardiac enzymes, which were negative.  Lipid panel was performed, which was stable with a total cholesterol of  152, HDL 42, and LDL of 82.  Amylase and lipase were within normal  limits.  He had a normal white blood cell count.  Oxygen saturation is  stable on room air.  The patient's symptoms have resolved.  We have  asked [facility] to arrange an outpatient stress test.   MEDICATIONS AT TIME OF DISCHARGE:  1. Advair 1000/20 one tab p.o. daily.  2. Prilosec OTC 20 mg p.o. daily.  3. Baby aspirin 81 mg p.o. daily.   PERTINENT LABORATORIES AT TIME OF DISCHARGE:  Cardiac enzymes negative  x2,  amylase 48, lipase 20, deoxyhemoglobin 14.5, BUN 10, creatinine  0.94.   DISPOSITION:  Plan to discharge the patient home.  He is to follow up  with Dr. Marga Melnick in one to two weeks and to follow up with  [facility] for an outpatient stress test to be arranged prior to  discharge.      Sandford Craze, NP      Raenette Rover. Felicity Coyer, MD  Electronically Signed    MO/MEDQ  D:  10/26/2007  T:  10/26/2007  Job:  [id number]

## 2011-01-21 NOTE — Letter (Signed)
November 29, 2007    Titus Dubin. Alwyn Ren, MD,FACP,FCCP  [phone] [place],  [place] [address]   RE:  Tyler [name], Tyler [name]  MRN:  [id number]  /  DOB:  Oct 01, 1950   Dear Annette Stable:   Thank you for your referral of Tyler [name].  He is a pleasant 60-year-  old male with hyperlipidemia and a family history of cardiovascular  disease predominantly affecting his father.  He reports no personal  history of tobacco use, hypertension or diabetes mellitus.  In reviewing  records, he was recently admitted to [facility] in February  having presented with chest pain.  He describes the incident, stating  that he developed a dull chest pain when he got emotionally upset at  work.  He subsequently felt somewhat disoriented and ultimately  presented to Dr. Alwyn Ren for evaluation.  He was admitted to the hospital  and ruled out for myocardial infarction.  He states that his symptoms  lasted for approximately 45 minutes.  He received oxygen and aspirin but  no nitroglycerin for resolution of symptoms.  He was referred  subsequently for a followup outpatient exercise Myoview.  This was  performed on November 04, 2007 and revealed abnormal  electrocardiographic changes; however, with normal perfusion imaging,  including a normal transient ischemic dilatation ratio and an ejection  fraction of 54%.  He is referred today to discuss this.   Symptomatically, Tyler [name] states that he walks typically 2-4 times  a week and also exercises with weights.  He walks anywhere from 1-1/2 to  2-1/2 miles at a time.  He does not have any exertional chest pain.  He  states over the last few years he sometimes feels a tingling in his  elbows but nothing progressive.  His electrocardiogram today is normal,  showing sinus rhythm at 67 beats per minute.  I did review some old  records as well and see that he underwent a previous bicycle stress test  back in 2003 that demonstrated no electrocardiographic  abnormalities at  a heart rate of 141 beats per minute which at that time was 81% of the  maximum predicted heart rate.  In reviewing his most recent exercise  Myoview, he did develop abnormal ST-segment changes in leads II, III,  aVF and V4 through V6, near peak, although within 1 minute of recovery,  these changes had normalized.  He did not experience any chest pain and  had a good work load with a MET level of 13.7 METs.  I reviewed this  with the patient today, and we talked about the options including either  continued observation with aggressive risk factor modification versus  proceeding onto additional anatomic cardiac testing.  His feeling was to  continue observation at this point, which I think is reasonable.   ALLERGIES:  SULFA DRUGS.   PRESENT MEDICATIONS:  1. Aspirin 81 mg p.o. daily.  2. Advicor 1000 mg p.o. q.h.s.  3. Prilosec 20 mg p.o. daily.  4. Multivitamin daily.  5. Omega 3 supplements daily.  6. Advil p.r.n.  7. Sudafed p.r.n.   PAST MEDICAL HISTORY:  As outlined above.  The patient reports  tonsillectomy in 1963, hydrocele in 1969, and a right eye surgery in  1984.   SOCIAL HISTORY:  The patient is married.  He has one child.  He denies  any tobacco use.  He drinks rare alcoholic beverages.  He reports trying  to cut out simple carbohydrates in his diet.  Reports a  stable weight  around 202-203 pounds.  He is exercising regularly.   REVIEW OF SYSTEMS:  As outlined above.  He reports seasonal allergies.  Occasional constipation.  History of peptic ulcer disease and reflux.  Also arthritic discomfort.  States he had a severe viral infection as a  child back in the 1960s.   FAMILY HISTORY:  Family history is reviewed.  The patient's father died  at age 72 having previously developed congestive heart failure and  carotid artery disease as well as coronary artery disease.  The  patient's mother died at age 27 with chronic obstructive pulmonary  disease.   Paternal grandfather also with coronary artery disease.   PHYSICAL EXAMINATION:  GENERAL:  The patient is comfortable, mildly  overweight, and in no acute distress.  VITAL SIGNS:  Blood pressure is 118/76, heart rate is 67, weight is 208  pounds.  HEENT:  Conjunctiva and lids normal.  Oropharynx is clear.  NECK:  Supple.  No elevated jugular venous pressure, no loud bruits.  No  thyromegaly is noted.  LUNGS:  Clear without labored breathing at rest.  CARDIAC:  Regular rate and rhythm.  No loud systolic murmur, S3 gallop.  No pericardial rub.  ABDOMEN:  Soft, no bruits.  No obvious hepatomegaly.  Bowel sounds are  present.  EXTREMITIES:  Exhibit no significant pitting edema.  Distal pulses are  2+.  SKIN:  Warm and dry.  MUSCULOSKELETAL:  No kyphosis is noted.  NEUROPSYCHIATRIC:  The patient is alert and oriented x3.  Affect is  appropriate.   IMPRESSION AND RECOMMENDATIONS:  A 60 year old male with hyperlipidemia  and some family history of cardiovascular disease.  He had a single  episode of chest dullness during emotional upset back in February and  was observed in the hospital, ruling out for myocardial infarction.  He  had a followup Myoview which demonstrated abnormal electrocardiographic  changes, although at a very good work load and in the absence of chest  pain or perfusion abnormalities, raising the possibility of a false  positive electrocardiographic result.  I reviewed this with him today.  It is possible that he still could have some underlying degree of  coronary atherosclerosis, although his stress test falls more in a low  risk category, and after considering the matter, his preference is for  observation and risk factor modification at this time.  We did discuss  other options,  considering either a cardiac CT angiogram or perhaps  diagnostic catheterization, particularly if he develops any progressive  symptoms in the meanwhile.  Will otherwise anticipate  followup over the  next 6 months, and I would suggest aggressive lipid management aiming  for a low-density lipoprotein around 70.  He is already on an aspirin  which he will continue.    Sincerely,      Jonelle Sidle, MD  Electronically Signed    SGM/MedQ  DD: 11/29/2007  DT: 11/29/2007  Job #: [id number]

## 2011-01-24 NOTE — Assessment & Plan Note (Signed)
[facility]                                 ON-CALL NOTE   CINSERE, MIZRAHI                     MRN:          [id number]  DATE:10/11/2006                            DOB:          December 05, 1950    Date of interaction October 11, 2006 at 11:39 a.m.  Phone number [phone]-  [phone].   OBJECTIVE:  Patient hurt his back.  Took a 3-mile hike yesterday and did  well, felt well when he was finished, then went home, sat on the toilet  to go to the bathroom, and when he got up, his back started hurting when  he got up.  Now he has difficulty walking, needs support when he does,  cannot stand up straight.  Had the same type of thing happen 4 or 5  years ago, and was given muscle relaxers, which helped.   Patient, otherwise, takes Prilosec, Advicor and fish oil.   HE HAS NO ALLERGIES OTHER THAN SULFA, WHICH CAUSES RASH WHEN HE IS IN  THE SUN.   ASSESSMENT:  Low back strain.   PLAN:  Suggested he try Flexeril 10 mg b.i.d.  Prescription was called  to [facility] at [phone] for 60 pills.  Also, told him to take Advil 2 every 4  hours with food and use ice on the back 20 minutes every hour today,  then tomorrow heat to the back at night before bedtime and when he gets  up in the morning.  See Dr. Alwyn Ren if things do not improve.  Primary  care Kwanza Cancelliere is Dr. Alwyn Ren.  Home office is [place].     Arta Silence, MD  Electronically Signed    RNS/MedQ  DD: 10/11/2006  DT: 10/11/2006  Job #: [id number]

## 2011-01-24 NOTE — Op Note (Signed)
[place]  Patient:    Tyler [name], Tyler [name]                     MRN: [id number] Proc. Date: 11/12/99 Adm. Date:  [id number] Disc. Date: [id number] Attending:  Susa Day CC:         Katy Fitch. Sypher, Montez Hageman., M.D. (2)                           Operative Report  PREOPERATIVE DIAGNOSIS:  Displaced interarticular radial condyle fracture, right small finger proximal phalanx at proximal interphalangeal joint.  POSTOPERATIVE DIAGNOSIS:  Displaced interarticular radial condyle fracture, right small finger proximal phalanx at proximal interphalangeal joint.  OPERATION PERFORMED:  Closed reduction and percutaneous Kirschner wire fixation of right small finger proximal phalangeal condylar interarticular proximal interphalangeal joint.  SURGEON:  Katy Fitch. Sypher, Montez Hageman., M.D.  ASSISTANT:  Jaquelyn Bitter. Chabon, P.A.  ANESTHESIA:  General.  SUPERVISING ANESTHESIOLOGIST:  Dr. Zoila Shutter.  INDICATIONS FOR PROCEDURE:  Laymon Stockert is a 60 year old man who was roughhousing with his son and accidentally fell catching his small finger in a ent on the floor sustaining an abduction injury to his right small finger PIP joint. He was seen at the [facility] emergency room where x-rays revealed a displaced radial condyle fracture of the right small finger proximal  phalanx at the PIP joint.  He was appropriately splinted and referred for hand surgery consult.  He was seen for consultation in the office on November 11, 1999.  At that time we recommended anatomic reduction of the fracture and Kirschner wire fixation.  He  understood that there was a chance that we would need to perform open reduction to obtain a proper reduction of the fracture.  DESCRIPTION OF PROCEDURE:  Corbett Moulder was brought to the operating room and placed in supine position on the operating table.  Following induction of general anesthesia, the right arm  was prepped with Betadine soap and solution and sterilely draped.  Following exsanguination of the limb with an Esmarch bandage, the arterial tourniquet was inflated to 220 mmHg.  The fracture was reduced by maneuver of PIP flexion and compression of the condyles.  Two 0.035 Kirschner wires were placed  with the aid of the C-arm fluoroscope.  Anatomic reduction was obtained and maintained with the Kirschner wires.  The finger was then buddy strapped to the adjacent ring finger and placed in a afe position plaster splint.  There were no apparent complications.   The patient tolerated the surgery and anesthesia well and was transferred to the recovery room with stable vital signs.  He will be discharged with a prescription for Tylenol 3, 20 tablets, 1 to 2 tablets p.o. q.4-6h. p.r.n. pain, also Advil p.r.n. DD:  11/12/99 TD:  11/13/99 Job: [id number]

## 2011-04-04 ENCOUNTER — Other Ambulatory Visit: Payer: Self-pay | Admitting: Internal Medicine

## 2011-04-04 DIAGNOSIS — E785 Hyperlipidemia, unspecified: Secondary | ICD-10-CM

## 2011-04-04 DIAGNOSIS — T887XXA Unspecified adverse effect of drug or medicament, initial encounter: Secondary | ICD-10-CM

## 2011-04-07 ENCOUNTER — Other Ambulatory Visit: Payer: Self-pay | Admitting: Internal Medicine

## 2011-04-07 ENCOUNTER — Other Ambulatory Visit (INDEPENDENT_AMBULATORY_CARE_PROVIDER_SITE_OTHER): Payer: BC Managed Care – PPO

## 2011-04-07 DIAGNOSIS — T887XXA Unspecified adverse effect of drug or medicament, initial encounter: Secondary | ICD-10-CM

## 2011-04-07 DIAGNOSIS — E785 Hyperlipidemia, unspecified: Secondary | ICD-10-CM

## 2011-04-07 LAB — HEPATIC FUNCTION PANEL
ALT: 20 U/L (ref 0–53)
AST: 21 U/L (ref 0–37)
Alkaline Phosphatase: 63 U/L (ref 39–117)
Bilirubin, Direct: 0 mg/dL (ref 0.0–0.3)
Total Bilirubin: 0.7 mg/dL (ref 0.3–1.2)

## 2011-04-07 LAB — LIPID PANEL: HDL: 47.5 mg/dL (ref >39.00)

## 2011-04-07 MED ORDER — LANSOPRAZOLE 15 MG PO CPDR: 15.0000 mg | DELAYED_RELEASE_CAPSULE | Freq: Every day | ORAL | Status: DC

## 2011-04-07 NOTE — Progress Notes (Signed)
Labs only

## 2011-04-07 NOTE — Telephone Encounter (Signed)
RX sent to pharmacy  

## 2011-04-10 MED ORDER — OMEPRAZOLE MAGNESIUM 20 MG PO TBEC
20.0000 mg | DELAYED_RELEASE_TABLET | ORAL | Status: DC
Start: 2011-04-10 — End: 2012-04-14

## 2011-04-10 NOTE — Telephone Encounter (Signed)
Addended by: Edgardo Roys on: 04/10/2011 01:28 PM   Modules accepted: Orders

## 2011-04-10 NOTE — Telephone Encounter (Signed)
Patient called and said this prescription should have been for Prilosec OTC 42 count---not for Prevacid----[facility] to [facility] [place]--please call in new prescription

## 2011-04-10 NOTE — Telephone Encounter (Signed)
I called patient and informed him rx sent to pharmacy.

## 2011-04-19 ENCOUNTER — Other Ambulatory Visit: Payer: Self-pay | Admitting: Internal Medicine

## 2011-06-02 LAB — DIFFERENTIAL
Basophils Absolute: 0.1
Basophils Relative: 1
Eosinophils Absolute: 0.2
Eosinophils Relative: 3
Lymphocytes Relative: 39
Lymphs Abs: 2.8
Monocytes Absolute: 0.7
Monocytes Relative: 10
Neutro Abs: 3.5
Neutrophils Relative %: 48

## 2011-06-02 LAB — COMPREHENSIVE METABOLIC PANEL
ALT: 21
CO2: 28
Calcium: 9.1
Creatinine, Ser: 0.99
GFR calc non Af Amer: >60
Glucose, Bld: 115 — ABNORMAL HIGH

## 2011-06-02 LAB — LIPID PANEL
Cholesterol: 152
HDL: 42

## 2011-06-02 LAB — AMYLASE: Amylase: 48

## 2011-06-02 LAB — CK TOTAL AND CKMB (NOT AT ARMC)
CK, MB: 1.5
Total CK: 148

## 2011-06-02 LAB — CBC
HCT: 42.5
Hemoglobin: 14.5
RBC: 4.61
RDW: 12.6

## 2011-06-02 LAB — TROPONIN I
Troponin I: 0.01
Troponin I: 0.02

## 2011-06-02 LAB — LIPASE, BLOOD: Lipase: 26

## 2011-07-09 ENCOUNTER — Ambulatory Visit (INDEPENDENT_AMBULATORY_CARE_PROVIDER_SITE_OTHER): Payer: BC Managed Care – PPO

## 2011-07-09 DIAGNOSIS — Z23 Encounter for immunization: Secondary | ICD-10-CM

## 2011-07-10 ENCOUNTER — Ambulatory Visit: Payer: BC Managed Care – PPO

## 2011-09-09 DIAGNOSIS — K635 Polyp of colon: Secondary | ICD-10-CM

## 2011-09-09 HISTORY — DX: Polyp of colon: K63.5

## 2011-10-20 ENCOUNTER — Ambulatory Visit (INDEPENDENT_AMBULATORY_CARE_PROVIDER_SITE_OTHER): Payer: BC Managed Care – PPO | Admitting: Internal Medicine

## 2011-10-20 ENCOUNTER — Encounter: Payer: Self-pay | Admitting: Internal Medicine

## 2011-10-20 VITALS — BP 130/82 | HR 78 | Temp 98.4°F | Wt 208.6 lb

## 2011-10-20 DIAGNOSIS — R51 Headache: Secondary | ICD-10-CM

## 2011-10-20 DIAGNOSIS — B999 Unspecified infectious disease: Secondary | ICD-10-CM

## 2011-10-20 MED ORDER — GABAPENTIN 100 MG PO CAPS: 100.0000 mg | ORAL_CAPSULE | Freq: Three times a day (TID) | ORAL | Status: DC

## 2011-10-20 NOTE — Patient Instructions (Signed)

## 2011-10-20 NOTE — Progress Notes (Signed)
Subjective:    Patient ID: Tyler [name], male    DOB: Nov 05, 1950, 61 y.o.   MRN: [id number]  HPI HEADACHE : Onset: 1 month ago  Location: occipital , initially bilaterally; now > on L with radiation to L frontal area since 2/10  Quality: throbbing/ piercing Frequency: daily now , previously gone by mid morning Precipitating factors: none Prior treatment: NSAIDS with some benefit Associated Symptoms Nausea/vomiting:no except with gastroenteritis 2/2-3 Photophobia/phonophobia: some to sound  Tearing of eyes: no Sinus pain/pressure: no Family hx/PMH  migraine: no  Personal stressors:no Red Flags Fever:no Neck pain/stiffness: yes on L  Vision/speech/swallow/hearing difficulty: hypersensitivity to sound X 1 on 2/8  Focal weakness/numbness: no Altered mental status:no  Trauma: OD orbital trauma 1980s New type of headache: no  Anticoagulant use: no    Review of Systems  At the time of the gastroenteritis 2/2-3 he had a lesion over the left chin left neck and inside the buccal mucosa on the left. The rash has been resolving without treatment.  He questions a relationship of headache to mucus accumulation,a recurrent problem each winter with environmental drying.     Objective:   Physical Exam Gen.: Healthy and well-nourished in appearance. Alert, appropriate and cooperative throughout exam. Head: Normocephalic without obvious abnormalities  Eyes: No corneal or conjunctival inflammation noted. Pupils equal round reactive to light and accommodation. FOV normal. Extraocular motion intact. Vision grossly normal. Ears: External  ear exam reveals no significant lesions or deformities. Canals clear .TMs normal. Hearing is slightly decreased bilaterally. No lateralization with tuning fork; air conduction greater than bone conduction bilaterally Nose: External nasal exam reveals no deformity or inflammation. Nasal mucosa are pink and moist. No lesions or exudates noted.  Mouth: Oral  mucosa and oropharynx reveal no lesions or exudates. I see no lesion inside the oral cavity on the left buccal mucosa. Teeth in good repair. Neck: No deformities, masses, or tenderness noted. Range of motion slightly decreased flexion and lateral rotation.  Lungs: Normal respiratory effort; chest expands symmetrically. Lungs are clear to auscultation without rales, wheezes, or increased work of breathing. Heart: Normal rate and rhythm. Normal S1 and S2. No gallop, click, or rub.No murmur.                                                                                    Musculoskeletal/extremities: No deformity or scoliosis noted of  the thoracic or lumbar spine. No clubbing, cyanosis or  edema. Flexion contraction of the right fifth digit. Tone & strength  normal.Joints normal. Nail health  good. Vascular: Carotid, radial artery pulses are full and equal. No bruits present. Neurologic: Alert and oriented x3. Deep tendon reflexes symmetrical and normal. Gait is normal. Finger-nose testing and Romberg are also normal         Skin: Faint erythematous lesion approximately 1.5  x 1.5  cm the base of the left neck. Very faint resolving herpetic lesion left chin. Lymph: No cervical, axillary lymphadenopathy present. Psych: Mood and affect are normal. Normally interactive  Assessment & Plan:  #1 headache, occipital. The pattern has changed and become unilateral. No definite neuromuscular deficits present  #2 gastroenteritis, resolved  #3 left-sided facial/neck  rash in the context of #2. Probable post-zoster neuralgia is most likely process here.  Plan : see orders and recommendations

## 2011-10-31 ENCOUNTER — Telehealth: Payer: Self-pay | Admitting: Internal Medicine

## 2011-10-31 MED ORDER — TRAMADOL HCL 50 MG PO TABS: ORAL_TABLET | ORAL | Status: DC

## 2011-10-31 NOTE — Telephone Encounter (Signed)
Patient called stating that the medication for his headaches, gabapentin, is not working & is requesting a new prescription. Please send to [facility] on [address]. Please let me know if this is not the correct procedure. Thanks [employer]

## 2011-10-31 NOTE — Telephone Encounter (Signed)
Discuss with patient, Rx sent. 

## 2011-10-31 NOTE — Telephone Encounter (Signed)
Dr.Hopper please advise 

## 2011-10-31 NOTE — Telephone Encounter (Signed)
Tramadol 50 mg one every 6-8 hours as needed only, dispense 30. Office visit if no betterPlease keep a diary of your headaches . Document  each occurrence on the calendar with notation of : #1 any prodrome ( any non headache symptom such as marked fatigue,visual changes, ,etc ) which precedes actual headache ; #2) severity on 1-10 scale; #3) any triggers ( food/ drink,enviromenntal or weather changes ,physical or emotional stress) in 8-12 hour period prior to the headache; & #4) response to any medications or other intervention. Please review "Headache" @ [facility] for additional information.

## 2012-01-01 ENCOUNTER — Telehealth: Payer: Self-pay | Admitting: Internal Medicine

## 2012-01-01 NOTE — Telephone Encounter (Signed)
Caller: Floyed/Patient; PCP: Marga Melnick; CB#: [phone];[id number] on a clean nail while mowing the grass.  He removed same (went through his boot). Wants to make sure that he has had a tetanus shot within the past 10 years. Last tetanus was 10/2010.  Home care given per [place] [facility] protocal.  Also has redness at left great toe.  No drainage from the toe.  Triaged per Foot non-injury, home care given.

## 2012-02-12 ENCOUNTER — Telehealth: Payer: Self-pay | Admitting: Internal Medicine

## 2012-02-12 ENCOUNTER — Emergency Department (HOSPITAL_COMMUNITY)
Admission: EM | Admit: 2012-02-12 | Discharge: 2012-02-12 | Disposition: A | Discharge status: Home / Self Care | Payer: BC Managed Care – PPO | Source: Home / Self Care | Attending: Emergency Medicine | Admitting: Emergency Medicine

## 2012-02-12 ENCOUNTER — Encounter (HOSPITAL_COMMUNITY): Payer: Self-pay

## 2012-02-12 DIAGNOSIS — B029 Zoster without complications: Secondary | ICD-10-CM

## 2012-02-12 DIAGNOSIS — B028 Zoster with other complications: Secondary | ICD-10-CM

## 2012-02-12 MED ORDER — PREDNISONE 20 MG PO TABS: 40.0000 mg | ORAL_TABLET | Freq: Every day | ORAL | Status: AC

## 2012-02-12 MED ORDER — ACYCLOVIR 400 MG PO TABS: 800.0000 mg | ORAL_TABLET | Freq: Every day | ORAL | Status: AC

## 2012-02-12 NOTE — Telephone Encounter (Signed)
Caller: Tyler [name]/Patient; PCP: Marga Melnick; CB#: [phone]; Had nausea and diarrhea on Tuesday.  Today has a rash above his left eye and around to his nose/cheek area.  Has some itching.  No fever.   States that he has had same before when he has had gi problems.  Asking for an appt on Friday.   Triaged per Rash protocal.  Needs to be seen in 4 hours.  He is in [place] and will not be back near the office until ~ 5p.  Urged him to go to UC to be seen.

## 2012-02-12 NOTE — ED Notes (Signed)
Rash on face after stomach virus?shingles

## 2012-02-12 NOTE — ED Provider Notes (Addendum)
History     CSN: [id number]  Arrival date & time 02/12/12  1718   First MD Initiated Contact with Patient 02/12/12 1722      Chief Complaint  Patient presents with  . Rash    (Consider location/radiation/quality/duration/timing/severity/associated sxs/prior treatment) HPI Comments: Patient presents urgent care as yesterday he was experiencing some loose stools and some cramping pains on his upper and lower abdomen. He feels that since today the symptoms seem to be improving but have noticed a discomfort in rash that has appear on the left side of his face all way down to the left side of his nose and even inside of his nose. It is somewhat tender. Patient denies any eye pain or visual changes. Denies any numbness, or weakness of his face or upper extremities. Patient does not feel he has had any fevers.  "I think I have had this before  So called my Dr. but they told me they can prescribe me over the phone that I needed to be seen in urgent care" ( the prescribed and be prednisone in the past I think). So that is why I came here" " I think first had  like a stomach virus infection in my stomach and that could have triggered this what I think is shingles"" I was going to pass my Dr. Jason Nest next appointment to give me a shingle shot"  The history is provided by the patient.    Past Medical History  Diagnosis Date  . Hyperlipidemia   . GERD (gastroesophageal reflux disease)     Past Surgical History  Procedure Date  . Hydrocoelectomy   . Tonsillectomy and adenoidectomy   . Orbital fracture surgery     Family History  Problem Relation Age of Onset  . Heart disease Father   . Heart disease Maternal Grandfather   . Heart disease Paternal Grandfather     History  Substance Use Topics  . Smoking status: Never Smoker   . Smokeless tobacco: Not on file  . Alcohol Use: Yes     Occasionally       Review of Systems  Constitutional: Positive for appetite change. Negative for fever,  chills, diaphoresis, activity change and fatigue.  HENT: Negative for neck pain and neck stiffness.   Eyes: Negative for photophobia, redness, itching and visual disturbance.  Respiratory: Negative for cough and shortness of breath.   Cardiovascular: Negative for chest pain.  Gastrointestinal: Positive for abdominal pain and diarrhea. Negative for nausea, vomiting, blood in stool, anal bleeding and rectal pain.  Skin: Positive for rash.  Neurological: Negative for dizziness, tremors, facial asymmetry, weakness, light-headedness, numbness and headaches.    Allergies  Sulfonamide derivatives  Home Medications   Current Outpatient Rx  Name Route Sig Dispense Refill  . OMEPRAZOLE MAGNESIUM 20 MG PO TBEC Oral Take 1 tablet (20 mg total) by mouth as directed. 42 tablet 11  . PRAVASTATIN SODIUM 40 MG PO TABS  TAKE ONE TABLET BY MOUTH NIGHTLY AT BEDTIME 90 tablet 3  . ACYCLOVIR 400 MG PO TABS Oral Take 2 tablets (800 mg total) by mouth 5 (five) times daily. 30 tablet 0  . GABAPENTIN 100 MG PO CAPS Oral Take 1 capsule (100 mg total) by mouth 3 (three) times daily. 30 capsule 1  . ONE-DAILY MULTI VITAMINS PO TABS Oral Take 1 tablet by mouth daily.    [name] PREDNISONE 20 MG PO TABS Oral Take 2 tablets (40 mg total) by mouth daily. 2 tablets daily for 5  days 10 tablet 0  . TRAMADOL HCL 50 MG PO TABS  Take 1 tab every 6-8 hours as needed only 30 tablet 0    BP 131/73  Pulse 64  Temp(Src) 98.8 F (37.1 C) (Oral)  Resp 16  SpO2 97%  Physical Exam  Nursing note and vitals reviewed. Constitutional: He appears well-developed and well-nourished.  Non-toxic appearance. He does not have a sickly appearance. He does not appear ill. No distress.  HENT:  Head: Normocephalic.    Right Ear: Tympanic membrane and ear canal normal.  Left Ear: Tympanic membrane and ear canal normal.  Ears:  Eyes: Conjunctivae and EOM are normal. Pupils are equal, round, and reactive to light. Right eye exhibits no  chemosis, no discharge, no exudate and no hordeolum. No foreign body present in the right eye. Left eye exhibits no discharge and no hordeolum. No foreign body present in the left eye.    Neck: Normal range of motion. Neck supple.  Abdominal: Soft. He exhibits no distension and no mass. There is no tenderness. There is no rebound and no guarding.  Lymphadenopathy:    He has no cervical adenopathy.  Skin: Rash noted. There is erythema.    ED Course  Procedures (including critical care time)  Labs Reviewed - No data to display No results found.   1. Herpes Zoster Dermatitis   2. Minimal resolving gastrointestinal symptoms, diarrhea and epigastric cramping pain  She was started on a sickly her in a prednisone course.  MDM  Herpes zoster with a facial dermatome distribution. Associated mild gastrointestinal symptoms. Patient looks comfortable mildly anxious, with a soft abdomen and no ocular involvement.        Jimmie Molly, MD 02/12/12 2119  Jimmie Molly, MD 02/12/12 1610  Jimmie Molly, MD 02/12/12 2122

## 2012-02-12 NOTE — Discharge Instructions (Signed)
The distribution of this rash makes it very consistent and suspicious for herpes zoster. Take this medicines as discussed.(Abundant water while you're taking the acyclovir)

## 2012-03-01 ENCOUNTER — Telehealth: Payer: Self-pay | Admitting: Internal Medicine

## 2012-03-01 ENCOUNTER — Other Ambulatory Visit: Payer: Self-pay | Admitting: Internal Medicine

## 2012-03-01 DIAGNOSIS — Z1211 Encounter for screening for malignant neoplasm of colon: Secondary | ICD-10-CM

## 2012-03-01 NOTE — Telephone Encounter (Signed)
Pt is scheduled for cpe on 04/22/12 and would like to have his colonoscopy done before this. He states he has not had one in 12 years and his wife can only take him on Fridays. She has Fridays off until the middle of August. Please advise.

## 2012-03-01 NOTE — Telephone Encounter (Signed)
Dr.Hopper please advise if ok to place order for referral

## 2012-03-04 ENCOUNTER — Encounter: Payer: Self-pay | Admitting: Internal Medicine

## 2012-04-09 ENCOUNTER — Ambulatory Visit (AMBULATORY_SURGERY_CENTER): Payer: BC Managed Care – PPO | Admitting: *Deleted

## 2012-04-09 ENCOUNTER — Encounter: Payer: Self-pay | Admitting: Internal Medicine

## 2012-04-09 VITALS — Ht 72.0 in | Wt 216.3 lb

## 2012-04-09 DIAGNOSIS — Z1211 Encounter for screening for malignant neoplasm of colon: Secondary | ICD-10-CM

## 2012-04-09 MED ORDER — MOVIPREP 100 G PO SOLR: ORAL | Status: DC

## 2012-04-14 ENCOUNTER — Other Ambulatory Visit: Payer: Self-pay | Admitting: Internal Medicine

## 2012-04-22 ENCOUNTER — Encounter: Payer: BC Managed Care – PPO | Admitting: Internal Medicine

## 2012-04-23 ENCOUNTER — Encounter: Payer: Self-pay | Admitting: Internal Medicine

## 2012-04-23 ENCOUNTER — Ambulatory Visit (AMBULATORY_SURGERY_CENTER): Payer: BC Managed Care – PPO | Admitting: Internal Medicine

## 2012-04-23 VITALS — BP 123/71 | HR 67 | Temp 97.8°F | Resp 16 | Ht 73.0 in | Wt 216.0 lb

## 2012-04-23 DIAGNOSIS — D126 Benign neoplasm of colon, unspecified: Secondary | ICD-10-CM

## 2012-04-23 DIAGNOSIS — Z1211 Encounter for screening for malignant neoplasm of colon: Secondary | ICD-10-CM

## 2012-04-23 MED ORDER — SODIUM CHLORIDE 0.9 % IV SOLN: 500.0000 mL | INTRAVENOUS | Status: DC

## 2012-04-23 NOTE — Patient Instructions (Addendum)

## 2012-04-23 NOTE — Progress Notes (Signed)
Patient did not experience any of the following events: a burn prior to discharge; a fall within the facility; wrong site/side/patient/procedure/implant event; or a hospital transfer or hospital admission upon discharge from the facility. (G8907) Patient did not have preoperative order for IV antibiotic SSI prophylaxis. (G8918)  

## 2012-04-23 NOTE — Op Note (Signed)
[facility] [address] [place], [place]  [address]  COLONOSCOPY PROCEDURE REPORT  PATIENT:  Tyler, [name]  MR#:  [id number] BIRTHDATE:  Feb 04, 1951, 60 yrs. old  GENDER:  male ENDOSCOPIST:  Wilhemina Bonito. Eda Keys, MD REF. BY:  Screening Recall PROCEDURE DATE:  04/23/2012 PROCEDURE:  Colonoscopy with snare polypectomy x 2 ASA CLASS:  Class II INDICATIONS:  Routine Risk Screening ; colon 1999 negative MEDICATIONS:   MAC sedation, administered by CRNA, propofol (Diprivan) 300 mg IV  DESCRIPTION OF PROCEDURE:   After the risks benefits and alternatives of the procedure were thoroughly explained, informed consent was obtained.  Digital rectal exam was performed and revealed no abnormalities.   The LB CF-[id number] endoscope was introduced through the anus and advanced to the cecum, which was identified by both the appendix and ileocecal valve, without limitations.  The quality of the prep was good, using MoviPrep. The instrument was then slowly withdrawn as the colon was fully examined. <<PROCEDUREIMAGES>>  FINDINGS:  Two 2mm polyps were found in the ascending colon and snared without cautery. Retrieval was successful.   Otherwise normal colonoscopy without other polyps, masses, vascular ectasias, or inflammatory changes.   Retroflexed views in the rectum revealed internal hemorrhoids.    The time to cecum = 2:00 minutes. The scope was then withdrawn in  12:23  minutes from the cecum and the procedure completed.  COMPLICATIONS:  None  ENDOSCOPIC IMPRESSION: 1) Two tiny polyps in the ascending colon - removed 2) Otherwise normal colonoscopy  RECOMMENDATIONS: 1) Repeat colonoscopy in 5 years if polyp adenomatous; otherwise 10 years  ______________________________ Wilhemina Bonito. Eda Keys, MD  CC:  Pecola Lawless, MDThe Patient  n. Rosalie DoctorWilhemina Bonito. Eda Keys at 04/23/2012 12:55 PM  Ines Bloomer, [phone]

## 2012-04-26 ENCOUNTER — Telehealth: Payer: Self-pay | Admitting: *Deleted

## 2012-04-26 NOTE — Telephone Encounter (Signed)
  Follow up Call-  Call back number 04/23/2012  Post procedure Call Back phone  # [phone]  Permission to leave phone message Yes     Patient questions:  Do you have a fever, pain , or abdominal swelling? no Pain Score  0 *  Have you tolerated food without any problems? yes  Have you been able to return to your normal activities? yes  Do you have any questions about your discharge instructions: Diet   no Medications  no Follow up visit  no  Do you have questions or concerns about your Care? no  Actions: * If pain score is 4 or above: No action needed, pain <4.

## 2012-04-27 ENCOUNTER — Encounter: Payer: Self-pay | Admitting: Internal Medicine

## 2012-05-21 ENCOUNTER — Other Ambulatory Visit: Payer: Self-pay | Admitting: General Practice

## 2012-05-21 ENCOUNTER — Other Ambulatory Visit: Payer: Self-pay | Admitting: Internal Medicine

## 2012-05-21 MED ORDER — PRAVASTATIN SODIUM 40 MG PO TABS: 40.0000 mg | ORAL_TABLET | Freq: Every day | ORAL | Status: DC

## 2012-06-14 ENCOUNTER — Encounter: Payer: Self-pay | Admitting: Internal Medicine

## 2012-06-14 ENCOUNTER — Ambulatory Visit (INDEPENDENT_AMBULATORY_CARE_PROVIDER_SITE_OTHER): Payer: BC Managed Care – PPO | Admitting: Internal Medicine

## 2012-06-14 VITALS — HR 72 | Temp 97.9°F | Resp 12 | Ht 71.0 in | Wt 215.6 lb

## 2012-06-14 DIAGNOSIS — G4730 Sleep apnea, unspecified: Secondary | ICD-10-CM

## 2012-06-14 DIAGNOSIS — Z23 Encounter for immunization: Secondary | ICD-10-CM

## 2012-06-14 DIAGNOSIS — E785 Hyperlipidemia, unspecified: Secondary | ICD-10-CM

## 2012-06-14 DIAGNOSIS — Z0000 Encounter for general adult medical examination without abnormal findings: Secondary | ICD-10-CM

## 2012-06-14 NOTE — Progress Notes (Signed)
  Subjective:    Patient ID: Tyler Tyler [name], male    DOB: [birth year], 61 y.o.   MRN: [id number]  HPI  Tyler Tyler [name] is here for a physical;acute issues include ongoing snoring & apnea.      Review of Systems His wife describes significant snoring and intermittent apnea. Breathe Easy strip is of benefit marginal benefit.  He underwent official testing in the 1990s ; but the test was nondiagnostic as he was unable to go to sleep with the equipment attached. He never returned for followup    Objective:   Physical Exam Gen.:  well-nourished in appearance. Alert, appropriate and cooperative throughout exam. Head: Normocephalic without obvious abnormalities;  no alopecia  Eyes: No corneal or conjunctival inflammation noted. Pupils equal round reactive to light and accommodation. Fundal exam is benign without hemorrhages, exudate, papilledema. Extraocular motion intact. Vision grossly normal. Ears: External  ear exam reveals no significant lesions or deformities. Canals clear .TMs normal. Hearing is grossly normal bilaterally. Nose: External nasal exam reveals no deformity or inflammation. Nasal mucosa are pink and moist. No lesions or exudates noted.  Mouth: Oral mucosa and oropharynx reveal no lesions or exudates. Teeth in good repair. Neck: No deformities, masses, or tenderness noted. Range of motion &Thyroid normal. Lungs: Normal respiratory effort; chest expands symmetrically. Lungs are clear to auscultation without rales, wheezes, or increased work of breathing. Heart: Normal rate and rhythm. Normal S1 and S2. No gallop, click, or rub.  S4 with slurring ; no  murmur. Abdomen: Bowel sounds normal; abdomen soft and nontender. No masses, organomegaly or hernias noted. Genitalia: Genitalia normal . Dr Marina Goodell did DRE recently .                                                                                   Musculoskeletal/extremities: No deformity or scoliosis noted of  the thoracic or lumbar  spine. No clubbing, cyanosis, edema, or significant deformity noted. Range of motion  normal .Tone & strength  Normal.Joints:flexion 5th R finger. Nail health  good. Vascular: Carotid, radial artery, dorsalis pedis and  posterior tibial pulses are Tyler [name] and equal. No bruits present. Neurologic: Alert and oriented x3. Deep tendon reflexes symmetrical and normal.         Skin: Intact without suspicious lesions or rashes. Lymph: No cervical, axillary, or inguinal lymphadenopathy present. Psych: Mood and affect are normal. Normally interactive                                                                                          Assessment & Plan:  #1 comprehensive physical exam; no acute findings #2 see Problem List with Assessments & Recommendations Plan: see Orders

## 2012-06-14 NOTE — Patient Instructions (Addendum)
Preventive Health Care: Exercise at least 30-45 minutes a day,  3-4 days a week.  Eat a low-fat diet with lots of fruits and vegetables, up to 7-9 servings per day.  Consume less than 40 grams of sugar per day from foods & drinks with High Fructose Corn Sugar as #1,2,3 or # 4 on label. Please  schedule fasting Labs : BMET,Lipids, hepatic panel, CBC & dif, TSH. PLEASE BRING THESE INSTRUCTIONS TO FOLLOW UP  LAB APPOINTMENT.This will guarantee correct labs are drawn, eliminating need for repeat blood sampling ( needle sticks ! ). Diagnoses /Codes: V70.0.  If you activate My Chart; the results can be released to you as soon as they populate from the lab. If you choose not to use this program; the labs have to be reviewed, copied & mailed   causing a delay in getting the results to you.

## 2012-06-16 ENCOUNTER — Other Ambulatory Visit (INDEPENDENT_AMBULATORY_CARE_PROVIDER_SITE_OTHER): Payer: BC Managed Care – PPO

## 2012-06-16 DIAGNOSIS — Z0000 Encounter for general adult medical examination without abnormal findings: Secondary | ICD-10-CM

## 2012-06-16 LAB — LIPID PANEL
Cholesterol: 175 mg/dL (ref 0–200)
LDL Cholesterol: 94 mg/dL (ref 0–99)
Triglycerides: 198 mg/dL — ABNORMAL HIGH (ref 0.0–149.0)

## 2012-06-16 LAB — CBC WITH DIFFERENTIAL/PLATELET
Basophils Absolute: 0 10*3/uL (ref 0.0–0.1)
Eosinophils Absolute: 0.2 10*3/uL (ref 0.0–0.7)
Hemoglobin: 14.6 g/dL (ref 13.0–17.0)
Lymphocytes Relative: 35.9 % (ref 12.0–46.0)
Monocytes Relative: 8.8 % (ref 3.0–12.0)
Neutrophils Relative %: 51.9 % (ref 43.0–77.0)
Platelets: 184 10*3/uL (ref 150.0–400.0)
RDW: 12.8 % (ref 11.5–14.6)

## 2012-06-16 LAB — BASIC METABOLIC PANEL
Chloride: 108 mEq/L (ref 96–112)
GFR: 87.28 mL/min (ref >60.00)
Potassium: 4.2 mEq/L (ref 3.5–5.1)
Sodium: 139 mEq/L (ref 135–145)

## 2012-06-16 LAB — HEPATIC FUNCTION PANEL
ALT: 20 U/L (ref 0–53)
AST: 21 U/L (ref 0–37)
Albumin: 4.1 g/dL (ref 3.5–5.2)
Alkaline Phosphatase: 69 U/L (ref 39–117)
Total Protein: 7.1 g/dL (ref 6.0–8.3)

## 2012-06-16 LAB — TSH: TSH: 1.49 u[IU]/mL (ref 0.35–5.50)

## 2012-07-16 ENCOUNTER — Ambulatory Visit (INDEPENDENT_AMBULATORY_CARE_PROVIDER_SITE_OTHER): Payer: BC Managed Care – PPO | Admitting: Pulmonary Disease

## 2012-07-16 ENCOUNTER — Encounter: Payer: Self-pay | Admitting: Pulmonary Disease

## 2012-07-16 VITALS — BP 128/76 | HR 73 | Temp 97.8°F | Ht 72.0 in | Wt 218.4 lb

## 2012-07-16 DIAGNOSIS — G4710 Hypersomnia, unspecified: Secondary | ICD-10-CM

## 2012-07-16 NOTE — Progress Notes (Signed)
  Subjective:    Patient ID: Tyler [name], male    DOB: Sep 22, 1950, 61 y.o.   MRN: [id number]  HPI The patient is a 61 year old male who I've been asked to see for possible obstructive sleep apnea.  He has been noted to have loud snoring as well as an abnormal breathing pattern during sleep by his wife.  He also comments on classic choking arousals during the night.  He has had this issue for many years, but it has worsened.  He had a sleep study in 2001 that did not show sleep apnea.  The patient has frequent awakenings at night, and is not rested in the mornings upon arising.  He notes significant sleep pressured during the day, and will fall asleep in the evening watching television or movies.  He also notes sleep pressure with driving.  He states that his weight is up about 5 pounds over the last 2 years, and his Epworth score today is very abnormal at 21.  Sleep Questionnaire: What time do you typically go to bed?( Between what hours) 11pm to midnight How long does it take you to fall asleep? less than 20 mins How many times during the night do you wake up? 5 What time do you get out of bed to start your day? 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 (In pounds) 5 lb (2.268 kg) Have you ever had a sleep study before? Yes If yes, location of study? [place] If yes, date of study? 90"s Do you currently use CPAP? No Do you wear oxygen at any time? No    Review of Systems  Constitutional: Negative for fever and unexpected weight change.  HENT: Positive for congestion and postnasal drip. Negative for ear pain, nosebleeds, sore throat, rhinorrhea, sneezing, trouble swallowing, dental problem and sinus pressure.   Eyes: Negative for redness and itching.  Respiratory: Negative for cough, chest tightness, shortness of breath and wheezing.   Cardiovascular: Negative for palpitations and leg swelling.    Gastrointestinal: Negative for nausea and vomiting.  Genitourinary: Negative for dysuria.  Musculoskeletal: Negative for joint swelling.  Skin: Negative for rash.  Neurological: Negative for headaches.  Hematological: Does not bruise/bleed easily.  Psychiatric/Behavioral: Negative for dysphoric mood. The patient is not nervous/anxious.        Objective:   Physical Exam Constitutional:  Overweight male, no acute distress  HENT:  Nares patent without discharge, but narrowed significantly  Oropharynx without exudate, palate and uvula are elongated.   Eyes:  Perrla, eomi, no scleral icterus  Neck:  No JVD, no TMG  Cardiovascular:  Normal rate, regular rhythm, no rubs or gallops.  No murmurs        Intact distal pulses  Pulmonary :  Normal breath sounds, no stridor or respiratory distress   No rales, rhonchi, or wheezing  Abdominal:  Soft, nondistended, bowel sounds present.  No tenderness noted.   Musculoskeletal:  No lower extremity edema noted.  Lymph Nodes:  No cervical lymphadenopathy noted  Skin:  No cyanosis noted  Neurologic:  Alert, appropriate, moves all 4 extremities without obvious deficit.         Assessment & Plan:

## 2012-07-16 NOTE — Patient Instructions (Addendum)
Will schedule for a sleep study.  Will arrange for followup once results are available.

## 2012-07-16 NOTE — Assessment & Plan Note (Signed)
The patient's history is very suggestive of clinically significant sleep apnea.  He had a sleep study in 2001 which was unremarkable, but certainly did not exclude the upper airway resistant syndrome.  The patient now states that his snoring and abnormal breathing pattern has worsened, and he has severe daytime sleepiness with periods of inactivity.  At this point, I think he needs to have a followup sleep study, and the patient is agreeable.  He has no significant underlying medical problems, and I consider him to be a good candidate for home sleep testing.

## 2012-12-29 ENCOUNTER — Other Ambulatory Visit: Payer: Self-pay | Admitting: Internal Medicine

## 2013-01-18 ENCOUNTER — Encounter: Payer: Self-pay | Admitting: Internal Medicine

## 2013-01-18 ENCOUNTER — Ambulatory Visit (INDEPENDENT_AMBULATORY_CARE_PROVIDER_SITE_OTHER): Payer: BC Managed Care – PPO | Admitting: Internal Medicine

## 2013-01-18 VITALS — BP 122/78 | HR 91 | Temp 98.1°F | Wt 216.0 lb

## 2013-01-18 DIAGNOSIS — B0059 Other herpesviral disease of eye: Secondary | ICD-10-CM

## 2013-01-18 MED ORDER — ACYCLOVIR 5 % EX OINT: TOPICAL_OINTMENT | CUTANEOUS | Status: DC

## 2013-01-18 NOTE — Patient Instructions (Addendum)
L. lysine, a natural supplement, may be effective for suppressing the simplex lesions

## 2013-01-18 NOTE — Progress Notes (Signed)
  Subjective:    Patient ID: Tyler [name], male    DOB: 1951/01/13, 62 y.o.   MRN: [id number]  HPI  He has had recurrent rash; the initial episode was one month ago on the left shoulder. Subsequently he developed a lesion under the bottom lip. After that there was a lesion which was painful in the left nostril  As of 01/14/13 he developed a small rash above the left eye medially.  He questions whether GI upset is a trigger for these.  He used an antifungal topically with some questionable benefit.  The lesions initially were stinging & not pruritic. There has been some vesicle formation of the lesion in the left nostril.  In the past similar lesions responded to generic Famvir but this medicine caused some headache and nausea   Review of Systems  He denies any significant extrinsic rhinoconjunctivitis symptoms. He's had no wheezing or shortness of breath.  There is no significant pruritus associated with these lesions.  There is no associated fever, chills, sweats.  He denies any genital lesions or dysuria, hematuria, or pyuria.     Objective:   Physical Exam  He appears healthy and well-nourished & in no distress  Pupils are equal and reactive to light; extraocular motion is intact.Vision intact  Oropharynx reveals no significant lesions.  Nasal mucosa is moist without significant lesion either  He has no lymphadenopathy about the neck or axilla.  There is a bland slightly scaly rash at the left shoulder with an irregular border  There is a 3 x 3 mm erythematous lesion above the left eye which is nonblanching. 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 Clinically there is no evidence of Rosacea. #2 tinea corporis L shoulder

## 2013-06-14 ENCOUNTER — Ambulatory Visit (INDEPENDENT_AMBULATORY_CARE_PROVIDER_SITE_OTHER): Payer: BC Managed Care – PPO

## 2013-06-14 DIAGNOSIS — Z23 Encounter for immunization: Secondary | ICD-10-CM

## 2013-06-21 ENCOUNTER — Telehealth: Payer: Self-pay

## 2013-06-21 NOTE — Telephone Encounter (Signed)
Medication List and allergies: done  [facility] or [facility] [address] [place] for local prescriptions  Immunizations due: Zostavax (would like to get) flu vaccine UTD  A/P:  LAST: HM due: PSA:  04/2011 WNL  CCS: UTD 04/2012  DM: na HTN: na Lipids: due (will have labs Thurs a.m.)  To Discuss with Provider: Patient would like to discuss restarting 81mg  aspirin Has been feeling tired and run down x 2-3 months Would like to have PSA, Vit D, B12 and testosterone checked

## 2013-06-21 NOTE — Telephone Encounter (Deleted)
LM for CB  Medication List and allergies:   for 90 day supply (mail order) for local prescriptions  Immunizations due: zostavax  A/P:  LAST: HM due: PSA:  10/2010 WNL  CCS: UTD 04/2012 WNL DM: n/a HTN: n/a Lipids: due  To Discuss with Provider:

## 2013-06-22 ENCOUNTER — Ambulatory Visit (INDEPENDENT_AMBULATORY_CARE_PROVIDER_SITE_OTHER): Payer: BC Managed Care – PPO | Admitting: Internal Medicine

## 2013-06-22 ENCOUNTER — Encounter: Payer: Self-pay | Admitting: Internal Medicine

## 2013-06-22 VITALS — BP 137/71 | HR 76 | Temp 98.5°F | Ht 70.25 in | Wt 215.0 lb

## 2013-06-22 DIAGNOSIS — R6882 Decreased libido: Secondary | ICD-10-CM

## 2013-06-22 DIAGNOSIS — D126 Benign neoplasm of colon, unspecified: Secondary | ICD-10-CM | POA: Insufficient documentation

## 2013-06-22 DIAGNOSIS — R351 Nocturia: Secondary | ICD-10-CM

## 2013-06-22 DIAGNOSIS — Z0000 Encounter for general adult medical examination without abnormal findings: Secondary | ICD-10-CM

## 2013-06-22 DIAGNOSIS — Z2911 Encounter for prophylactic immunotherapy for respiratory syncytial virus (RSV): Secondary | ICD-10-CM

## 2013-06-22 DIAGNOSIS — E782 Mixed hyperlipidemia: Secondary | ICD-10-CM

## 2013-06-22 DIAGNOSIS — R5381 Other malaise: Secondary | ICD-10-CM

## 2013-06-22 DIAGNOSIS — Z23 Encounter for immunization: Secondary | ICD-10-CM

## 2013-06-22 DIAGNOSIS — K635 Polyp of colon: Secondary | ICD-10-CM | POA: Insufficient documentation

## 2013-06-22 MED ORDER — PRAVASTATIN SODIUM 40 MG PO TABS: 40.0000 mg | ORAL_TABLET | Freq: Every day | ORAL | Status: DC

## 2013-06-22 MED ORDER — TAMSULOSIN HCL 0.4 MG PO CAPS: 0.4000 mg | ORAL_CAPSULE | Freq: Every day | ORAL | Status: DC

## 2013-06-22 NOTE — Patient Instructions (Signed)
Fasting Labs in am : BMET,Lipids, hepatic panel, CBC & dif, TSH, PSA, testosterone.

## 2013-06-22 NOTE — Progress Notes (Signed)
Subjective:    Patient ID: Tyler [name], male    DOB: 19-Dec-1950, 62 y.o.   MRN: [id number]  HPI  He is here for a physical;acute issues include nocturia, weakness, fatigue & decreased libido (see Problem List)     Review of Systems  He is on a heart healthy diet; he exercises 30 minutes 2 times per week as walking without symptoms. Specifically he denies chest pain, palpitations,  or claudication.  He does have EIB. Family history is positive for CAD but negative for premature coronary disease. Advanced cholesterol testing reveals his LDL goal is less than 100. There is medication compliance with the statin. Significant abdominal symptoms, memory deficit, or myalgias denied.      Objective:   Physical Exam Gen.: Healthy and well-nourished in appearance. Alert, appropriate and cooperative throughout exam.Appears younger than stated age  Head: Normocephalic without obvious abnormalities; no alopecia  Eyes: No corneal or conjunctival inflammation noted. Pupils equal round reactive to light and accommodation.  Extraocular motion intact. Ears: External  ear exam reveals no significant lesions or deformities. Canals clear .TMs normal. Hearing is grossly normal bilaterally. Nose: External nasal exam reveals no deformity or inflammation. Nasal mucosa are pink and moist. No lesions or exudates noted.   Mouth: Oral mucosa and oropharynx reveal no lesions or exudates. Teeth in good repair. Neck: No deformities, masses, or tenderness noted. Range of motion good. Thyroid normal. Lungs: Normal respiratory effort; chest expands symmetrically. Lungs are clear to auscultation without rales, wheezes, or increased work of breathing. Heart: Normal rate and rhythm. Normal S1 and S2. No gallop, click, or rub. S4 w/o murmur. Abdomen: Bowel sounds normal; abdomen soft and nontender. No masses, organomegaly or hernias noted. Genitalia: Genitalia normal except for left varices. Prostate is upper limits of  normal and slightly asymmetric. Left lobe slightly greater than right without nodules or induration.                                  Musculoskeletal/extremities: No deformity or scoliosis noted of  the thoracic or lumbar spine.  No clubbing, cyanosis, or edema noted. Range of motion normal .Tone & strength  Normal. Joints  reveal post fracture 5 th R finger changes. Nail health good. Able to lie down & sit up w/o help. Negative SLR bilaterally Vascular: Carotid, radial artery, dorsalis pedis and  posterior tibial pulses are full and equal. No bruits present. Neurologic: Alert and oriented x3. Deep tendon reflexes symmetrical and normal.          Skin: Intact without suspicious lesions or rashes. Lymph: No cervical, axillary, or inguinal lymphadenopathy present. Psych: Mood and affect are normal. Normally interactive                                                                                        Assessment & Plan:  #1 comprehensive physical exam; no acute findings  #2 fatigue in the context of nocturia with sleep disruption. Prostate slightly asymmetric and upper limits of normal in size  #3 weakness and decreased libido   Plan: see Orders  &  Recommendations

## 2013-06-23 ENCOUNTER — Other Ambulatory Visit (INDEPENDENT_AMBULATORY_CARE_PROVIDER_SITE_OTHER): Payer: BC Managed Care – PPO

## 2013-06-23 ENCOUNTER — Other Ambulatory Visit: Payer: Self-pay | Admitting: Internal Medicine

## 2013-06-23 DIAGNOSIS — R7989 Other specified abnormal findings of blood chemistry: Secondary | ICD-10-CM | POA: Insufficient documentation

## 2013-06-23 DIAGNOSIS — Z0000 Encounter for general adult medical examination without abnormal findings: Secondary | ICD-10-CM

## 2013-06-23 LAB — BASIC METABOLIC PANEL
CO2: 25 mEq/L (ref 19–32)
Calcium: 9.4 mg/dL (ref 8.4–10.5)
Chloride: 106 mEq/L (ref 96–112)
Glucose, Bld: 107 mg/dL — ABNORMAL HIGH (ref 70–99)
Potassium: 4.3 mEq/L (ref 3.5–5.1)
Sodium: 140 mEq/L (ref 135–145)

## 2013-06-23 LAB — CBC WITH DIFFERENTIAL/PLATELET
Basophils Relative: 0.5 % (ref 0.0–3.0)
Eosinophils Absolute: 0.2 10*3/uL (ref 0.0–0.7)
HCT: 43.9 % (ref 39.0–52.0)
Hemoglobin: 14.9 g/dL (ref 13.0–17.0)
Lymphocytes Relative: 31 % (ref 12.0–46.0)
Lymphs Abs: 2 10*3/uL (ref 0.7–4.0)
MCHC: 34 g/dL (ref 30.0–36.0)
Neutro Abs: 3.7 10*3/uL (ref 1.4–7.7)
RBC: 4.83 Mil/uL (ref 4.22–5.81)
RDW: 12.9 % (ref 11.5–14.6)

## 2013-06-23 LAB — TSH: TSH: 2.35 u[IU]/mL (ref 0.35–5.50)

## 2013-06-23 LAB — HEPATIC FUNCTION PANEL
Albumin: 4.1 g/dL (ref 3.5–5.2)
Alkaline Phosphatase: 75 U/L (ref 39–117)
Bilirubin, Direct: 0.1 mg/dL (ref 0.0–0.3)
Total Protein: 6.7 g/dL (ref 6.0–8.3)

## 2013-06-23 LAB — LIPID PANEL
Cholesterol: 186 mg/dL (ref 0–200)
HDL: 43.8 mg/dL (ref >39.00)
Total CHOL/HDL Ratio: 4
Triglycerides: 195 mg/dL — ABNORMAL HIGH (ref 0.0–149.0)
VLDL: 39 mg/dL (ref 0.0–40.0)

## 2013-06-24 ENCOUNTER — Telehealth: Payer: Self-pay | Admitting: *Deleted

## 2013-06-24 NOTE — Telephone Encounter (Signed)
Spoke with patient concerning Dr Caryl Never notes and he verbalized understanding and stated he would give [place] a call back if the rash spreads or he develops fever.

## 2013-06-24 NOTE — Telephone Encounter (Signed)
Use warm moist compresses to 4 times a day to the  area.NSAIDS ( Aleve, Advil, Naproxen) or Tylenol every 4 hrs as needed to treat inflammation. Report fever or spreading rash.  Shingles vaccine will never need to be administered again thankfully

## 2013-06-24 NOTE — Telephone Encounter (Signed)
Patient called and stated that he got his shingles vaccine in our office on Wednesday and said since then, the site where it was given is red, itchy and the skin is raised. Please advise.

## 2013-06-28 ENCOUNTER — Ambulatory Visit: Payer: BC Managed Care – PPO

## 2013-06-28 DIAGNOSIS — R7309 Other abnormal glucose: Secondary | ICD-10-CM

## 2013-06-28 LAB — HEMOGLOBIN A1C: Hgb A1c MFr Bld: 5.7 % (ref 4.6–6.5)

## 2013-11-06 ENCOUNTER — Other Ambulatory Visit: Payer: Self-pay | Admitting: Internal Medicine

## 2013-12-04 ENCOUNTER — Other Ambulatory Visit: Payer: Self-pay | Admitting: Internal Medicine

## 2014-04-27 ENCOUNTER — Encounter: Payer: Self-pay | Admitting: Family Medicine

## 2014-04-27 ENCOUNTER — Ambulatory Visit (INDEPENDENT_AMBULATORY_CARE_PROVIDER_SITE_OTHER): Payer: BC Managed Care – PPO | Admitting: Family Medicine

## 2014-04-27 VITALS — BP 128/79 | HR 67 | Temp 98.6°F | Resp 18 | Ht 70.25 in | Wt 209.0 lb

## 2014-04-27 DIAGNOSIS — R5381 Other malaise: Secondary | ICD-10-CM

## 2014-04-27 DIAGNOSIS — E291 Testicular hypofunction: Secondary | ICD-10-CM

## 2014-04-27 DIAGNOSIS — R5382 Chronic fatigue, unspecified: Secondary | ICD-10-CM

## 2014-04-27 DIAGNOSIS — R5383 Other fatigue: Secondary | ICD-10-CM

## 2014-04-27 LAB — CBC WITH DIFFERENTIAL/PLATELET
BASOS PCT: 0.6 % (ref 0.0–3.0)
Basophils Absolute: 0 10*3/uL (ref 0.0–0.1)
EOS PCT: 4 % (ref 0.0–5.0)
Eosinophils Absolute: 0.2 10*3/uL (ref 0.0–0.7)
HCT: 43.5 % (ref 39.0–52.0)
Hemoglobin: 14.7 g/dL (ref 13.0–17.0)
LYMPHS ABS: 2.3 10*3/uL (ref 0.7–4.0)
Lymphocytes Relative: 40.4 % (ref 12.0–46.0)
MCHC: 33.7 g/dL (ref 30.0–36.0)
MCV: 91.7 fl (ref 78.0–100.0)
MONO ABS: 0.5 10*3/uL (ref 0.1–1.0)
Monocytes Relative: 8.5 % (ref 3.0–12.0)
Neutro Abs: 2.7 10*3/uL (ref 1.4–7.7)
Neutrophils Relative %: 46.5 % (ref 43.0–77.0)
PLATELETS: 185 10*3/uL (ref 150.0–400.0)
RBC: 4.74 Mil/uL (ref 4.22–5.81)
RDW: 12.9 % (ref 11.5–15.5)
WBC: 5.7 10*3/uL (ref 4.0–10.5)

## 2014-04-27 LAB — TSH: TSH: 1.85 u[IU]/mL (ref 0.35–4.50)

## 2014-04-27 LAB — SEDIMENTATION RATE: Sed Rate: 4 mm/hr (ref 0–22)

## 2014-04-27 LAB — LUTEINIZING HORMONE: LH: 5.83 m[IU]/mL (ref 1.50–9.30)

## 2014-04-27 NOTE — Progress Notes (Signed)
Pre visit review using our clinic review tool, if applicable. No additional management support is needed unless otherwise documented below in the visit note. 

## 2014-04-27 NOTE — Progress Notes (Signed)
Office Note 04/30/2014  CC:  Chief Complaint  Patient presents with  . Fatigue  . Establish Care   HPI:  Tyler [name] is a 63 y.o. White male who is here to discuss fatigue, transfer care. Patient's most recent primary MD: Dr. Linna [name] (retiring). Old records in EPIC/[facility] EMR were reviewed prior to or during today's visit.  Has been feeling tired/fatigued for about a year or two now.  Sometimes feels "heavy" in both upper arms.  Says it is not extreme. 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  Not as active the last few years as earlier in his life. He does snore.  Says sleep study in remote past (>10 yrs ago) did not show OSA--but CPAP was discussed--he seemed unsure about this b/c it was so long ago, but he knows he was never put on CPAP.  His wife witnesses apneic spells in his sleep.  He does have a hx of EDS.  A re-eval with Dr. Gwenette [name] 07/2012 resulted in the impression that he had symptoms highly suggestive of OSA and a home sleep study was ordered but pt says insurance would not cover this.  I guess after this point the patient did not follow up any more about this problem.   Past Medical History  Diagnosis Date  . Hyperlipidemia   . GERD (gastroesophageal reflux disease)   . Colon polyps 2013  . BPH (benign prostatic hypertrophy)   . Fatigue     + excessive daytime somnolence    Past Surgical History  Procedure Laterality Date  . Hydrocoelectomy    . Tonsillectomy and adenoidectomy    . Orbital fracture surgery    . Colonoscopy  1998 & 2013     Dr [name]; 2 benign polyps 2013  . Upper gi endoscopy  2000 & 2011    dilation 2000  . Nocturnal polysomn  1990s    Sleep lab: no OSA (??)--Dr. Gwenette [name] said he needs another sleep study as of 2013    Family History  Problem Relation Age of Onset  . Heart attack Father 61     CHF  . Heart attack Maternal Grandfather     > 55  . Heart attack Paternal Grandfather     >55  . Colon  cancer Neg Hx   . Stomach cancer Neg Hx   . Diabetes Neg Hx   . Stroke Neg Hx   . Cancer Mother     Gynecologic  . COPD Mother     History   Social History  . Marital Status: Married    Spouse Name: N/A    Number of Children: 1  . Years of Education: N/A   Occupational History  . Not on file.   Social History Main Topics  . Smoking status: Never Smoker   . Smokeless tobacco: Never Used  . Alcohol Use: 0.6 oz/week    1 Cans of beer per week     Comment:  2 beers/ month  . Drug Use: No  . Sexual Activity: Not on file   Other Topics Concern  . Not on file   Social History Narrative   Married, 1 son.   Lives in [place].   Product/process development scientist.   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          Outpatient Encounter Prescriptions as of 04/27/2014  Medication Sig  . LYSINE HCL PO Take by mouth daily. Taking 2-3 x a week  .  Multiple Vitamin (MULTIVITAMIN) tablet Take 1 tablet by mouth daily.  . pravastatin (PRAVACHOL) 40 MG tablet Take 1 tablet (40 mg total) by mouth daily.  [name] PRILOSEC OTC 20 MG tablet TAKE 1 TABLET BY MOUTH DAILY  . tamsulosin (FLOMAX) 0.4 MG CAPS capsule TAKE ONE CAPSULE BY MOUTH DAILY AFTER supper    Allergies  Allergen Reactions  . Sulfonamide Derivatives     REACTION: rash Because of a history of documented adverse serious drug reaction;Medi Alert bracelet  is recommended    ROS Review of Systems  Constitutional: Negative for fever, chills, appetite change and fatigue.  HENT: Negative for congestion, dental problem, ear pain and sore throat.   Eyes: Negative for discharge, redness and visual disturbance.  Respiratory: Negative for cough, chest tightness, shortness of breath and wheezing.   Cardiovascular: Negative for chest pain, palpitations and leg swelling.  Gastrointestinal: Negative for nausea, vomiting, abdominal pain, diarrhea and blood in stool.  Genitourinary: Negative for dysuria, urgency, frequency, hematuria, flank pain and  difficulty urinating.  Musculoskeletal: Negative for arthralgias (knees and feet intermittently: no swelling or redness), back pain, joint swelling, myalgias (occ upper arms) and neck stiffness.  Skin: Negative for pallor and rash.  Neurological: Negative for dizziness, speech difficulty, weakness and headaches.  Hematological: Negative for adenopathy. Does not bruise/bleed easily.  Psychiatric/Behavioral: Negative for confusion and sleep disturbance. The patient is not nervous/anxious.     PE; Blood pressure 128/79, pulse 67, temperature 98.6 F (37 C), temperature source Temporal, resp. rate 18, height 5' 10.25" (1.784 m), weight 209 lb (94.802 kg), SpO2 96.00%. Gen: Alert, well appearing.  Patient is oriented to person, place, time, and situation. MBP:JPET: no injection, icteris, swelling, or exudate.  EOMI, PERRLA. Mouth: lips without lesion/swelling.  Oral mucosa pink and moist. Oropharynx without erythema, exudate, or swelling.  Neck - No masses or thyromegaly or limitation in range of motion CV: RRR, no m/r/g.   LUNGS: CTA bilat, nonlabored resps, good aeration in all lung fields. ABD: soft, NT, ND, BS normal.  No hepatospenomegaly or mass.  No bruits. EXT: no clubbing, cyanosis, or edema.  Neuro: CN 2-12 intact bilaterally, strength 5/5 in proximal and distal upper extremities and lower extremities bilaterally.  No sensory deficits.  No tremor.  No disdiadochokinesis.  No ataxia.  Upper extremity and lower extremity DTRs symmetric.  No pronator drift. Musculoskeletal: no joint swelling, erythema, warmth, or tenderness.  ROM of all joints intact.  Pertinent labs: none today  ASSESSMENT AND PLAN:   Transfer pt.  Chronic fatigue Describes mixture of physical fatigue + excessive daytime somnolence. Exam normal. Hx of hypogonadism on labs last year. Will repeat testosterone total and free.  Will also check LH, TSH, Prolactin, and ESR. If testost still low then plan is to start  replacement and see how he feels.  If still residual sx's with decent amount of time on adequate replacement testosterone, then will need to pursue repeat nocturnal polysomnogram via pulm consult.  An After Visit Summary was printed and given to the patient.  Return in about 2 months (around 06/27/2014) for annual CPE with fasting labs the week prior.

## 2014-04-28 LAB — TESTOSTERONE, FREE, TOTAL, SHBG
Sex Hormone Binding: 28 nmol/L (ref 13–71)
TESTOSTERONE-% FREE: 2.1 % (ref 1.6–2.9)
Testosterone, Free: 46.7 pg/mL — ABNORMAL LOW (ref 47.0–244.0)
Testosterone: 222 ng/dL — ABNORMAL LOW (ref 300–890)

## 2014-04-28 LAB — PROLACTIN: PROLACTIN: 5.9 ng/mL (ref 2.1–17.1)

## 2014-04-30 ENCOUNTER — Encounter: Payer: Self-pay | Admitting: Family Medicine

## 2014-04-30 DIAGNOSIS — R5382 Chronic fatigue, unspecified: Secondary | ICD-10-CM | POA: Insufficient documentation

## 2014-04-30 NOTE — Assessment & Plan Note (Signed)
Describes mixture of physical fatigue + excessive daytime somnolence. Exam normal. Hx of hypogonadism on labs last year. Will repeat testosterone total and free.  Will also check LH, TSH, Prolactin, and ESR. If testost still low then plan is to start replacement and see how he feels.  If still residual sx's with decent amount of time on adequate replacement testosterone, then will need to pursue repeat nocturnal polysomnogram via pulm consult.

## 2014-05-03 ENCOUNTER — Other Ambulatory Visit: Payer: Self-pay | Admitting: Family Medicine

## 2014-05-03 DIAGNOSIS — E291 Testicular hypofunction: Secondary | ICD-10-CM

## 2014-05-03 MED ORDER — TESTOSTERONE CYPIONATE 200 MG/ML IM SOLN: 200.0000 mg | INTRAMUSCULAR | Status: DC

## 2014-05-10 ENCOUNTER — Ambulatory Visit (INDEPENDENT_AMBULATORY_CARE_PROVIDER_SITE_OTHER): Payer: BC Managed Care – PPO

## 2014-05-10 DIAGNOSIS — R7989 Other specified abnormal findings of blood chemistry: Secondary | ICD-10-CM

## 2014-05-10 DIAGNOSIS — E291 Testicular hypofunction: Secondary | ICD-10-CM

## 2014-05-10 MED ORDER — TESTOSTERONE CYPIONATE 200 MG/ML IM SOLN
200.0000 mg | Freq: Once | INTRAMUSCULAR | Status: AC
Start: 2014-05-10 — End: 2014-05-10
  Administered 2014-05-10: 200 mg via INTRAMUSCULAR

## 2014-05-10 NOTE — Progress Notes (Signed)
   Subjective:    Patient ID: Tyler [name], male    DOB: 05-20-1951, 64 y.o.   MRN: [id number]  HPI Pt came in for first testosterone injection today.    Review of Systems     Objective:   Physical Exam        Assessment & Plan:  Next injection in 2 weeks. His medication is at [facility], left draw.

## 2014-05-24 ENCOUNTER — Ambulatory Visit: Payer: BC Managed Care – PPO

## 2014-05-31 ENCOUNTER — Other Ambulatory Visit (INDEPENDENT_AMBULATORY_CARE_PROVIDER_SITE_OTHER): Payer: BC Managed Care – PPO

## 2014-05-31 DIAGNOSIS — E291 Testicular hypofunction: Secondary | ICD-10-CM

## 2014-05-31 LAB — TESTOSTERONE: Testosterone: 907.45 ng/dL — ABNORMAL HIGH (ref 300.00–890.00)

## 2014-06-13 ENCOUNTER — Ambulatory Visit (INDEPENDENT_AMBULATORY_CARE_PROVIDER_SITE_OTHER): Payer: BC Managed Care – PPO | Admitting: *Deleted

## 2014-06-13 DIAGNOSIS — E291 Testicular hypofunction: Secondary | ICD-10-CM

## 2014-06-13 DIAGNOSIS — R7989 Other specified abnormal findings of blood chemistry: Secondary | ICD-10-CM

## 2014-06-13 MED ORDER — TESTOSTERONE CYPIONATE 200 MG/ML IM SOLN
200.0000 mg | INTRAMUSCULAR | Status: DC
  Administered 2014-06-13: 200 mg via INTRAMUSCULAR

## 2014-06-14 ENCOUNTER — Ambulatory Visit: Payer: BC Managed Care – PPO

## 2014-06-19 ENCOUNTER — Other Ambulatory Visit (INDEPENDENT_AMBULATORY_CARE_PROVIDER_SITE_OTHER): Payer: BC Managed Care – PPO

## 2014-06-19 ENCOUNTER — Telehealth: Payer: Self-pay

## 2014-06-19 ENCOUNTER — Ambulatory Visit (INDEPENDENT_AMBULATORY_CARE_PROVIDER_SITE_OTHER): Payer: BC Managed Care – PPO

## 2014-06-19 DIAGNOSIS — R7989 Other specified abnormal findings of blood chemistry: Secondary | ICD-10-CM

## 2014-06-19 DIAGNOSIS — Z0000 Encounter for general adult medical examination without abnormal findings: Secondary | ICD-10-CM

## 2014-06-19 DIAGNOSIS — Z23 Encounter for immunization: Secondary | ICD-10-CM

## 2014-06-19 DIAGNOSIS — E291 Testicular hypofunction: Secondary | ICD-10-CM

## 2014-06-19 LAB — COMPREHENSIVE METABOLIC PANEL
ALBUMIN: 3.5 g/dL (ref 3.5–5.2)
ALT: 19 U/L (ref 0–53)
AST: 20 U/L (ref 0–37)
Alkaline Phosphatase: 63 U/L (ref 39–117)
BUN: 11 mg/dL (ref 6–23)
CHLORIDE: 107 meq/L (ref 96–112)
CO2: 23 mEq/L (ref 19–32)
Calcium: 8.9 mg/dL (ref 8.4–10.5)
Creatinine, Ser: 0.9 mg/dL (ref 0.4–1.5)
GFR: 92.34 mL/min (ref >60.00)
Glucose, Bld: 98 mg/dL (ref 70–99)
POTASSIUM: 4.5 meq/L (ref 3.5–5.1)
SODIUM: 137 meq/L (ref 135–145)
TOTAL PROTEIN: 6.6 g/dL (ref 6.0–8.3)
Total Bilirubin: 0.5 mg/dL (ref 0.2–1.2)

## 2014-06-19 LAB — LIPID PANEL
Cholesterol: 161 mg/dL (ref 0–200)
HDL: 35.6 mg/dL — ABNORMAL LOW (ref >39.00)
LDL CALC: 105 mg/dL — AB (ref 0–99)
NONHDL: 125.4
Total CHOL/HDL Ratio: 5
Triglycerides: 103 mg/dL (ref 0.0–149.0)
VLDL: 20.6 mg/dL (ref 0.0–40.0)

## 2014-06-19 LAB — CBC WITH DIFFERENTIAL/PLATELET
BASOS PCT: 0.6 % (ref 0.0–3.0)
Basophils Absolute: 0 10*3/uL (ref 0.0–0.1)
EOS ABS: 0.2 10*3/uL (ref 0.0–0.7)
Eosinophils Relative: 3.4 % (ref 0.0–5.0)
HCT: 45.3 % (ref 39.0–52.0)
Hemoglobin: 14.8 g/dL (ref 13.0–17.0)
LYMPHS PCT: 34 % (ref 12.0–46.0)
Lymphs Abs: 1.9 10*3/uL (ref 0.7–4.0)
MCHC: 32.6 g/dL (ref 30.0–36.0)
MCV: 93.2 fl (ref 78.0–100.0)
MONO ABS: 0.5 10*3/uL (ref 0.1–1.0)
Monocytes Relative: 8.3 % (ref 3.0–12.0)
NEUTROS PCT: 53.7 % (ref 43.0–77.0)
Neutro Abs: 3 10*3/uL (ref 1.4–7.7)
PLATELETS: 184 10*3/uL (ref 150.0–400.0)
RBC: 4.86 Mil/uL (ref 4.22–5.81)
RDW: 13.5 % (ref 11.5–15.5)
WBC: 5.6 10*3/uL (ref 4.0–10.5)

## 2014-06-19 LAB — TSH: TSH: 2.13 u[IU]/mL (ref 0.35–4.50)

## 2014-06-19 LAB — PSA: PSA: 2.7 ng/mL (ref 0.10–4.00)

## 2014-06-19 MED ORDER — TAMSULOSIN HCL 0.4 MG PO CAPS: ORAL_CAPSULE | ORAL | Status: DC

## 2014-06-19 NOTE — Telephone Encounter (Signed)
Pt came in for bloodwork and wanted to see if Dr. Anitra Lauth can refill his flomax. Dr Huey Bienenstock wrote Rx last. Please advise.

## 2014-06-19 NOTE — Telephone Encounter (Signed)
[facility] Rx sent in

## 2014-06-19 NOTE — Telephone Encounter (Signed)
Flomax eRx'd.

## 2014-06-20 LAB — TESTOSTERONE: Testosterone: 639.78 ng/dL (ref 300.00–890.00)

## 2014-06-26 ENCOUNTER — Ambulatory Visit (INDEPENDENT_AMBULATORY_CARE_PROVIDER_SITE_OTHER): Payer: BC Managed Care – PPO | Admitting: Family Medicine

## 2014-06-26 ENCOUNTER — Encounter: Payer: Self-pay | Admitting: Family Medicine

## 2014-06-26 VITALS — BP 124/78 | HR 71 | Temp 98.4°F | Resp 18 | Ht 70.25 in | Wt 216.0 lb

## 2014-06-26 DIAGNOSIS — Z0000 Encounter for general adult medical examination without abnormal findings: Secondary | ICD-10-CM

## 2014-06-26 DIAGNOSIS — E291 Testicular hypofunction: Secondary | ICD-10-CM

## 2014-06-26 MED ORDER — ASPIRIN EC 81 MG PO TBEC: 81.0000 mg | DELAYED_RELEASE_TABLET | Freq: Every day | ORAL | Status: DC

## 2014-06-26 NOTE — Progress Notes (Signed)
Office Note 06/26/2014  CC:  Chief Complaint  Patient presents with  . Follow-up   HPI:  Tyler [name] is a 63 y.o. White male who is here for CPE. Feeling much improved/more energy since getting on testosterone injections since starting them 2 mo ago. Recent testost level recheck 1 wk after injection was in mid-normal range.   Reviewed fasting HP +PSA labs today in detail.  No acute complaints.  Past Medical History  Diagnosis Date  . Hyperlipidemia   . GERD (gastroesophageal reflux disease)   . Colon polyps 2013  . BPH (benign prostatic hypertrophy)   . Fatigue     + excessive daytime somnolence  . Hypogonadism male     Past Surgical History  Procedure Laterality Date  . Hydrocoelectomy    . Tonsillectomy and adenoidectomy    . Orbital fracture surgery    . Colonoscopy  1998 & 2013     Dr [name]; 2 benign polyps 2013  . Upper gi endoscopy  2000 & 2011    dilation 2000  . Nocturnal polysomn  1990s    Sleep lab: no OSA (??)--Dr. [name] said he needs another sleep study as of 2013    Family History  Problem Relation Age of Onset  . Heart attack Father 6     CHF  . Heart attack Maternal Grandfather     > 55  . Heart attack Paternal Grandfather     >55  . Colon cancer Neg Hx   . Stomach cancer Neg Hx   . Diabetes Neg Hx   . Stroke Neg Hx   . Cancer Mother     Gynecologic  . COPD Mother     History   Social History  . Marital Status: Married    Spouse Name: N/A    Number of Children: 1  . Years of Education: N/A   Occupational History  . Not on file.   Social History Main Topics  . Smoking status: Never Smoker   . Smokeless tobacco: Never Used  . Alcohol Use: 0.6 oz/week    1 Cans of beer per week     Comment:  2 beers/ month  . Drug Use: No  . Sexual Activity: Not on file   Other Topics Concern  . Not on file   Social History Narrative   Married, 1 son.   Lives in [place].   Product/process development scientist.   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          Outpatient Prescriptions Prior to Visit  Medication Sig Dispense Refill  . LYSINE HCL PO Take by mouth daily. Taking 2-3 x a week      . Multiple Vitamin (MULTIVITAMIN) tablet Take 1 tablet by mouth daily.      . pravastatin (PRAVACHOL) 40 MG tablet Take 1 tablet (40 mg total) by mouth daily.  90 tablet  3  . tamsulosin (FLOMAX) 0.4 MG CAPS capsule TAKE ONE CAPSULE BY MOUTH DAILY AFTER supper  30 capsule  11  . testosterone cypionate (DEPOTESTOTERONE CYPIONATE) 200 MG/ML injection Inject 1 mL (200 mg total) into the muscle every 14 (fourteen) days.  10 mL  5  . PRILOSEC OTC 20 MG tablet TAKE 1 TABLET BY MOUTH DAILY  42 tablet  5   No facility-administered medications prior to visit.    Allergies  Allergen Reactions  . Sulfonamide Derivatives     REACTION: rash Because of a history of documented adverse serious drug reaction;Medi Alert  bracelet  is recommended   ROS Review of Systems  Constitutional: Negative for fever, chills, appetite change and fatigue.  HENT: Negative for congestion, dental problem, ear pain and sore throat.   Eyes: Negative for discharge, redness and visual disturbance.  Respiratory: Negative for cough, chest tightness, shortness of breath and wheezing.   Cardiovascular: Negative for chest pain, palpitations and leg swelling.  Gastrointestinal: Negative for nausea, vomiting, abdominal pain, diarrhea and blood in stool.  Genitourinary: Negative for dysuria, urgency, frequency, hematuria, flank pain and difficulty urinating.  Musculoskeletal: Negative for arthralgias, back pain, joint swelling, myalgias and neck stiffness.  Skin: Negative for pallor and rash.  Neurological: Negative for dizziness, speech difficulty, weakness and headaches.  Hematological: Negative for adenopathy. Does not bruise/bleed easily.  Psychiatric/Behavioral: Negative for confusion and sleep disturbance. The patient is not nervous/anxious.     PE; Blood  pressure 124/78, pulse 71, temperature 98.4 F (36.9 C), temperature source Temporal, resp. rate 18, height 5' 10.25" (1.784 m), weight 216 lb (97.977 kg), SpO2 96.00%. Gen: Alert, well appearing.  Patient is oriented to person, place, time, and situation. AFFECT: pleasant, lucid thought and speech. ENT: Ears: EACs clear, normal epithelium.  TMs with good light reflex and landmarks bilaterally.  Eyes: no injection, icteris, swelling, or exudate.  EOMI, PERRLA. Nose: no drainage or turbinate edema/swelling.  No injection or focal lesion.  Mouth: lips without lesion/swelling.  Oral mucosa pink and moist.  Dentition intact and without obvious caries or gingival swelling.  Oropharynx without erythema, exudate, or swelling.  Neck: supple/nontender.  No LAD, mass, or TM.  Carotid pulses 2+ bilaterally, without bruits. CV: RRR, no m/r/g.   LUNGS: CTA bilat, nonlabored resps, good aeration in all lung fields. ABD: soft, NT, ND, BS normal.  No hepatospenomegaly or mass.  No bruits. EXT: no clubbing, cyanosis, or edema.  Musculoskeletal: no joint swelling, erythema, warmth, or tenderness.  ROM of all joints intact. Skin - no sores or suspicious lesions or rashes or color changes  Pertinent labs:  Lab Results  Component Value Date   TSH 2.13 06/19/2014   Lab Results  Component Value Date   WBC 5.6 06/19/2014   HGB 14.8 06/19/2014   HCT 45.3 06/19/2014   MCV 93.2 06/19/2014   PLT 184.0 06/19/2014   Lab Results  Component Value Date   CREATININE 0.9 06/19/2014   BUN 11 06/19/2014   NA 137 06/19/2014   K 4.5 06/19/2014   CL 107 06/19/2014   CO2 23 06/19/2014   Lab Results  Component Value Date   ALT 19 06/19/2014   AST 20 06/19/2014   ALKPHOS 63 06/19/2014   BILITOT 0.5 06/19/2014   Lab Results  Component Value Date   CHOL 161 06/19/2014   Lab Results  Component Value Date   HDL 35.60* 06/19/2014   Lab Results  Component Value Date   LDLCALC 105* 06/19/2014   Lab Results   Component Value Date   TRIG 103.0 06/19/2014   Lab Results  Component Value Date   CHOLHDL 5 06/19/2014   Lab Results  Component Value Date   PSA 2.70 06/19/2014   PSA 2.28 06/23/2013   PSA 1.43 10/18/2010   ASSESSMENT AND PLAN:   Health maintenance examination Reviewed age and gender appropriate health maintenance issues (prudent diet, regular exercise, health risks of tobacco and excessive alcohol, use of seatbelts, fire alarms in home, use of sunscreen).  Also reviewed age and gender appropriate health screening as well as vaccine recommendations. HP  labs reviewed, no med changes recommended.  I recommended he restart 81mg  ASA qd. Colon cancer screening UTD.  Prostate ca screening: recent PSA normal, DRE normal today. Hypogonadism: improved with starting testost injections.  Continue with 200 mg IM q2 wks. No vaccines due today.   An After Visit Summary was printed and given to the patient.  FOLLOW UP:  Return in about 6 months (around 12/26/2014) for f/u hypogonadism and hyperlipidemia.

## 2014-06-26 NOTE — Assessment & Plan Note (Addendum)
Reviewed age and gender appropriate health maintenance issues (prudent diet, regular exercise, health risks of tobacco and excessive alcohol, use of seatbelts, fire alarms in home, use of sunscreen).  Also reviewed age and gender appropriate health screening as well as vaccine recommendations. HP labs reviewed, no med changes recommended.  I recommended he restart 81mg  ASA qd. Colon cancer screening UTD.  Prostate ca screening: recent PSA normal, DRE normal today. Hypogonadism: improved with starting testost injections.  Continue with 200 mg IM q2 wks. No vaccines due today.

## 2014-06-26 NOTE — Progress Notes (Signed)
Pre visit review using our clinic review tool, if applicable. No additional management support is needed unless otherwise documented below in the visit note. 

## 2014-07-05 ENCOUNTER — Ambulatory Visit (INDEPENDENT_AMBULATORY_CARE_PROVIDER_SITE_OTHER): Payer: BC Managed Care – PPO | Admitting: *Deleted

## 2014-07-05 DIAGNOSIS — E291 Testicular hypofunction: Secondary | ICD-10-CM

## 2014-07-05 MED ORDER — TESTOSTERONE CYPIONATE 200 MG/ML IM SOLN
200.0000 mg | INTRAMUSCULAR | Status: DC
  Administered 2014-07-05: 200 mg via INTRAMUSCULAR

## 2014-07-19 ENCOUNTER — Ambulatory Visit (INDEPENDENT_AMBULATORY_CARE_PROVIDER_SITE_OTHER): Payer: BC Managed Care – PPO

## 2014-07-19 DIAGNOSIS — R7989 Other specified abnormal findings of blood chemistry: Secondary | ICD-10-CM

## 2014-07-19 DIAGNOSIS — E291 Testicular hypofunction: Secondary | ICD-10-CM

## 2014-07-19 MED ORDER — TESTOSTERONE CYPIONATE 200 MG/ML IM SOLN
200.0000 mg | Freq: Once | INTRAMUSCULAR | Status: AC
  Administered 2014-07-19: 200 mg via INTRAMUSCULAR

## 2014-08-02 ENCOUNTER — Ambulatory Visit: Payer: BC Managed Care – PPO

## 2014-08-16 ENCOUNTER — Ambulatory Visit (INDEPENDENT_AMBULATORY_CARE_PROVIDER_SITE_OTHER): Payer: BC Managed Care – PPO

## 2014-08-16 DIAGNOSIS — R7989 Other specified abnormal findings of blood chemistry: Secondary | ICD-10-CM

## 2014-08-16 DIAGNOSIS — E291 Testicular hypofunction: Secondary | ICD-10-CM

## 2014-08-16 MED ORDER — TESTOSTERONE CYPIONATE 200 MG/ML IM SOLN
200.0000 mg | Freq: Once | INTRAMUSCULAR | Status: AC
  Administered 2014-08-16: 200 mg via INTRAMUSCULAR

## 2014-08-22 ENCOUNTER — Other Ambulatory Visit: Payer: Self-pay | Admitting: Family Medicine

## 2014-08-22 DIAGNOSIS — E782 Mixed hyperlipidemia: Secondary | ICD-10-CM

## 2014-08-22 MED ORDER — PRAVASTATIN SODIUM 40 MG PO TABS: 40.0000 mg | ORAL_TABLET | Freq: Every day | ORAL | Status: DC

## 2014-08-28 ENCOUNTER — Other Ambulatory Visit: Payer: Self-pay

## 2014-08-28 MED ORDER — OMEPRAZOLE MAGNESIUM 20 MG PO TBEC: 20.0000 mg | DELAYED_RELEASE_TABLET | Freq: Every day | ORAL | Status: DC

## 2014-08-29 ENCOUNTER — Other Ambulatory Visit: Payer: Self-pay | Admitting: Family Medicine

## 2014-08-29 MED ORDER — OMEPRAZOLE MAGNESIUM 20 MG PO TBEC: 20.0000 mg | DELAYED_RELEASE_TABLET | Freq: Every day | ORAL | Status: DC

## 2014-08-30 ENCOUNTER — Ambulatory Visit (INDEPENDENT_AMBULATORY_CARE_PROVIDER_SITE_OTHER): Payer: BC Managed Care – PPO | Admitting: Family Medicine

## 2014-08-30 DIAGNOSIS — E291 Testicular hypofunction: Secondary | ICD-10-CM

## 2014-08-30 MED ORDER — TESTOSTERONE CYPIONATE 200 MG/ML IM SOLN
200.0000 mg | Freq: Once | INTRAMUSCULAR | Status: AC
  Administered 2014-08-30: 200 mg via INTRAMUSCULAR

## 2014-09-13 ENCOUNTER — Ambulatory Visit (INDEPENDENT_AMBULATORY_CARE_PROVIDER_SITE_OTHER): Payer: BC Managed Care – PPO | Admitting: *Deleted

## 2014-09-13 DIAGNOSIS — E291 Testicular hypofunction: Secondary | ICD-10-CM

## 2014-09-13 MED ORDER — TESTOSTERONE CYPIONATE 200 MG/ML IM SOLN
200.0000 mg | INTRAMUSCULAR | Status: DC
  Administered 2014-09-13: 200 mg via INTRAMUSCULAR

## 2014-09-27 ENCOUNTER — Ambulatory Visit (INDEPENDENT_AMBULATORY_CARE_PROVIDER_SITE_OTHER): Payer: BC Managed Care – PPO | Admitting: *Deleted

## 2014-09-27 DIAGNOSIS — E291 Testicular hypofunction: Secondary | ICD-10-CM

## 2014-09-27 MED ORDER — TESTOSTERONE CYPIONATE 200 MG/ML IM SOLN
200.0000 mg | INTRAMUSCULAR | Status: DC
  Administered 2014-09-27: 200 mg via INTRAMUSCULAR

## 2014-10-11 ENCOUNTER — Ambulatory Visit (INDEPENDENT_AMBULATORY_CARE_PROVIDER_SITE_OTHER): Payer: BC Managed Care – PPO | Admitting: Family Medicine

## 2014-10-11 DIAGNOSIS — E291 Testicular hypofunction: Secondary | ICD-10-CM

## 2014-10-11 MED ORDER — TESTOSTERONE CYPIONATE 200 MG/ML IM SOLN
200.0000 mg | Freq: Once | INTRAMUSCULAR | Status: AC
  Administered 2014-10-11: 200 mg via INTRAMUSCULAR

## 2014-10-25 ENCOUNTER — Ambulatory Visit (INDEPENDENT_AMBULATORY_CARE_PROVIDER_SITE_OTHER): Payer: BC Managed Care – PPO | Admitting: *Deleted

## 2014-10-25 DIAGNOSIS — E291 Testicular hypofunction: Secondary | ICD-10-CM

## 2014-10-25 MED ORDER — TESTOSTERONE CYPIONATE 200 MG/ML IM SOLN
200.0000 mg | INTRAMUSCULAR | Status: DC
  Administered 2014-10-25: 200 mg via INTRAMUSCULAR

## 2014-11-08 ENCOUNTER — Ambulatory Visit (INDEPENDENT_AMBULATORY_CARE_PROVIDER_SITE_OTHER): Payer: BC Managed Care – PPO | Admitting: *Deleted

## 2014-11-08 DIAGNOSIS — E291 Testicular hypofunction: Secondary | ICD-10-CM

## 2014-11-08 MED ORDER — TESTOSTERONE CYPIONATE 200 MG/ML IM SOLN
200.0000 mg | INTRAMUSCULAR | Status: DC
  Administered 2014-11-08: 200 mg via INTRAMUSCULAR

## 2014-11-22 ENCOUNTER — Ambulatory Visit (INDEPENDENT_AMBULATORY_CARE_PROVIDER_SITE_OTHER): Payer: BC Managed Care – PPO | Admitting: Family Medicine

## 2014-11-22 DIAGNOSIS — E291 Testicular hypofunction: Secondary | ICD-10-CM

## 2014-11-22 MED ORDER — TESTOSTERONE CYPIONATE 200 MG/ML IM SOLN
200.0000 mg | Freq: Once | INTRAMUSCULAR | Status: AC
  Administered 2014-11-22: 200 mg via INTRAMUSCULAR

## 2014-12-06 ENCOUNTER — Ambulatory Visit (INDEPENDENT_AMBULATORY_CARE_PROVIDER_SITE_OTHER): Payer: BC Managed Care – PPO

## 2014-12-06 DIAGNOSIS — E291 Testicular hypofunction: Secondary | ICD-10-CM | POA: Diagnosis not present

## 2014-12-06 MED ORDER — TESTOSTERONE CYPIONATE 200 MG/ML IM SOLN
200.0000 mg | Freq: Once | INTRAMUSCULAR | Status: AC
  Administered 2014-12-06: 200 mg via INTRAMUSCULAR

## 2014-12-20 ENCOUNTER — Ambulatory Visit (INDEPENDENT_AMBULATORY_CARE_PROVIDER_SITE_OTHER): Payer: BC Managed Care – PPO

## 2014-12-20 DIAGNOSIS — E291 Testicular hypofunction: Secondary | ICD-10-CM

## 2014-12-20 MED ORDER — TESTOSTERONE CYPIONATE 200 MG/ML IM SOLN
200.0000 mg | Freq: Once | INTRAMUSCULAR | Status: AC
  Administered 2014-12-20: 200 mg via INTRAMUSCULAR

## 2014-12-26 ENCOUNTER — Encounter: Payer: Self-pay | Admitting: Family Medicine

## 2014-12-26 ENCOUNTER — Ambulatory Visit (INDEPENDENT_AMBULATORY_CARE_PROVIDER_SITE_OTHER): Payer: BC Managed Care – PPO | Admitting: Family Medicine

## 2014-12-26 VITALS — BP 120/80 | HR 83 | Temp 98.8°F | Resp 18 | Ht 70.25 in | Wt 215.0 lb

## 2014-12-26 DIAGNOSIS — N5201 Erectile dysfunction due to arterial insufficiency: Secondary | ICD-10-CM

## 2014-12-26 DIAGNOSIS — E782 Mixed hyperlipidemia: Secondary | ICD-10-CM | POA: Diagnosis not present

## 2014-12-26 DIAGNOSIS — Z114 Encounter for screening for human immunodeficiency virus [HIV]: Secondary | ICD-10-CM | POA: Diagnosis not present

## 2014-12-26 DIAGNOSIS — E291 Testicular hypofunction: Secondary | ICD-10-CM

## 2014-12-26 DIAGNOSIS — K219 Gastro-esophageal reflux disease without esophagitis: Secondary | ICD-10-CM | POA: Diagnosis not present

## 2014-12-26 LAB — CBC WITH DIFFERENTIAL/PLATELET
BASOS ABS: 0 10*3/uL (ref 0.0–0.1)
Basophils Relative: 0.4 % (ref 0.0–3.0)
Eosinophils Absolute: 0.2 10*3/uL (ref 0.0–0.7)
Eosinophils Relative: 2.5 % (ref 0.0–5.0)
HCT: 51.2 % (ref 39.0–52.0)
Hemoglobin: 17.4 g/dL — ABNORMAL HIGH (ref 13.0–17.0)
LYMPHS ABS: 2 10*3/uL (ref 0.7–4.0)
Lymphocytes Relative: 22.1 % (ref 12.0–46.0)
MCHC: 33.9 g/dL (ref 30.0–36.0)
MCV: 91.7 fl (ref 78.0–100.0)
MONO ABS: 0.7 10*3/uL (ref 0.1–1.0)
Monocytes Relative: 7.7 % (ref 3.0–12.0)
NEUTROS PCT: 67.3 % (ref 43.0–77.0)
Neutro Abs: 6 10*3/uL (ref 1.4–7.7)
PLATELETS: 190 10*3/uL (ref 150.0–400.0)
RBC: 5.58 Mil/uL (ref 4.22–5.81)
RDW: 13.7 % (ref 11.5–15.5)
WBC: 8.9 10*3/uL (ref 4.0–10.5)

## 2014-12-26 MED ORDER — SILDENAFIL CITRATE 100 MG PO TABS: 100.0000 mg | ORAL_TABLET | Freq: Every day | ORAL | Status: DC | PRN

## 2014-12-26 NOTE — Progress Notes (Signed)
Pre visit review using our clinic review tool, if applicable. No additional management support is needed unless otherwise documented below in the visit note. 

## 2014-12-26 NOTE — Progress Notes (Signed)
OFFICE VISIT  12/26/2014   CC:  Chief Complaint  Patient presents with  . Follow-up   HPI:    Patient is a 64 y.o. Caucasian male who presents for 6 mo f/u hypogonadism, hyperlipidemia, GERD. Reports feeling well, no acute complaints.  Hypogon: pleased with increased energy level from testost 200 mg IM q 2 wks. No adverse side effects. Still has problem with ED: gets erection most of the time but it is not sufficient for penetration.  Viagra has worked well for him in the past.  Hyperlipid: continues to feel fine taking pravastatin 40mg  qd.  Currently says he needs to get back on the exercise and diet regimen he was on in the past.  GERD: taking omeprazole 40mg  qAM, says it is still working well.  No probs with dyphagia any lately.  Past Medical History  Diagnosis Date  . Hyperlipidemia   . GERD (gastroesophageal reflux disease)   . Colon polyps 2013  . BPH (benign prostatic hypertrophy)   . Fatigue     + excessive daytime somnolence  . Hypogonadism male     Past Surgical History  Procedure Laterality Date  . Hydrocoelectomy    . Tonsillectomy and adenoidectomy    . Orbital fracture surgery    . Colonoscopy  1998 & 2013     Dr [name]; 2 benign polyps 2013  . Upper gi endoscopy  2000 & 2011    dilation 2000  . Nocturnal polysomn  1990s    Sleep lab: no OSA (??)--Dr. [name] said he needs another sleep study as of 2013    Outpatient Prescriptions Prior to Visit  Medication Sig Dispense Refill  . aspirin EC 81 MG tablet Take 1 tablet (81 mg total) by mouth daily. 30 tablet 0  . LYSINE HCL PO Take by mouth daily. Taking 2-3 x a week    . Multiple Vitamin (MULTIVITAMIN) tablet Take 1 tablet by mouth daily.    [name] omeprazole (PRILOSEC OTC) 20 MG tablet Take 1 tablet (20 mg total) by mouth daily. 42 tablet 3  . pravastatin (PRAVACHOL) 40 MG tablet Take 1 tablet (40 mg total) by mouth daily. 90 tablet 1  . testosterone cypionate (DEPOTESTOTERONE CYPIONATE) 200 MG/ML injection  Inject 1 mL (200 mg total) into the muscle every 14 (fourteen) days. 10 mL 5  . tamsulosin (FLOMAX) 0.4 MG CAPS capsule TAKE ONE CAPSULE BY MOUTH DAILY AFTER supper (Patient not taking: Reported on 12/26/2014) 30 capsule 11   No facility-administered medications prior to visit.    Allergies  Allergen Reactions  . Sulfonamide Derivatives     REACTION: rash Because of a history of documented adverse serious drug reaction;Medi Alert bracelet  is recommended    ROS As per HPI  PE: Blood pressure 120/80, pulse 83, temperature 98.8 F (37.1 C), temperature source Temporal, resp. rate 18, height 5' 10.25" (1.784 m), weight 215 lb (97.523 kg), SpO2 95 %. Gen: Alert, well appearing.  Patient is oriented to person, place, time, and situation. AFFECT: pleasant, lucid thought and speech. No further exam today.  LABS:  Lab Results  Component Value Date   TSH 2.13 06/19/2014   Lab Results  Component Value Date   WBC 5.6 06/19/2014   HGB 14.8 06/19/2014   HCT 45.3 06/19/2014   MCV 93.2 06/19/2014   PLT 184.0 06/19/2014   Lab Results  Component Value Date   CREATININE 0.9 06/19/2014   BUN 11 06/19/2014   NA 137 06/19/2014   K 4.5 06/19/2014  CL 107 06/19/2014   CO2 23 06/19/2014   Lab Results  Component Value Date   ALT 19 06/19/2014   AST 20 06/19/2014   ALKPHOS 63 06/19/2014   BILITOT 0.5 06/19/2014   Lab Results  Component Value Date   CHOL 161 06/19/2014   Lab Results  Component Value Date   HDL 35.60* 06/19/2014   Lab Results  Component Value Date   LDLCALC 105* 06/19/2014   Lab Results  Component Value Date   TRIG 103.0 06/19/2014   Lab Results  Component Value Date   CHOLHDL 5 06/19/2014   Lab Results  Component Value Date   PSA 2.70 06/19/2014   PSA 2.28 06/23/2013   PSA 1.43 10/18/2010   Lab Results  Component Value Date   TESTOSTERONE 639.78 06/19/2014     IMPRESSION AND PLAN:  1) Hypogonadism: The current medical regimen is effective;   continue present plan and medications. Check Hb today.  2) Hyperlipidemia: The current medical regimen is effective;  continue present plan and medications. AST/ALT has always been stable, most recently 6 mo ago.  3) GERD, hx of esoph stenosis requiring dilation: The current medical regimen is effective;  continue present plan and medications.  4) Erectile dysfunction: hx of good response to viagra.  New rx for this given to pt today: 100mg , 1 tab po qd prn, #6, RF x 6.  5) Preventative health care: pt accepted offer of HIV screening today.  An After Visit Summary was printed and given to the patient.  FOLLOW UP: Return in about 6 months (around 06/27/2015) for annual CPE with fasting labs (HP + PSA) the week prior.

## 2014-12-27 LAB — HIV ANTIBODY (ROUTINE TESTING W REFLEX): HIV 1&2 Ab, 4th Generation: NONREACTIVE

## 2015-01-03 ENCOUNTER — Ambulatory Visit (INDEPENDENT_AMBULATORY_CARE_PROVIDER_SITE_OTHER): Payer: BC Managed Care – PPO

## 2015-01-03 DIAGNOSIS — E291 Testicular hypofunction: Secondary | ICD-10-CM | POA: Diagnosis not present

## 2015-01-03 MED ORDER — TESTOSTERONE CYPIONATE 200 MG/ML IM SOLN
200.0000 mg | Freq: Once | INTRAMUSCULAR | Status: AC
  Administered 2015-01-03: 200 mg via INTRAMUSCULAR

## 2015-01-17 ENCOUNTER — Ambulatory Visit: Payer: BC Managed Care – PPO

## 2015-01-31 ENCOUNTER — Ambulatory Visit (INDEPENDENT_AMBULATORY_CARE_PROVIDER_SITE_OTHER): Payer: BC Managed Care – PPO

## 2015-01-31 DIAGNOSIS — E291 Testicular hypofunction: Secondary | ICD-10-CM | POA: Diagnosis not present

## 2015-01-31 MED ORDER — TESTOSTERONE CYPIONATE 200 MG/ML IM SOLN
200.0000 mg | Freq: Once | INTRAMUSCULAR | Status: AC
  Administered 2015-01-31: 200 mg via INTRAMUSCULAR

## 2015-02-13 ENCOUNTER — Other Ambulatory Visit: Payer: Self-pay | Admitting: *Deleted

## 2015-02-13 MED ORDER — TESTOSTERONE CYPIONATE 200 MG/ML IM SOLN: 200.0000 mg | INTRAMUSCULAR | Status: DC

## 2015-02-13 NOTE — Telephone Encounter (Signed)
Pt called and stated that his Rx for testosterone has expired and he will need a new one sent to [facility] in [place]. LOV 12/26/14, apt 02/14/15 for injection of testoterone, last written: 05/03/14 w/ 5RF. Please review, looks like he may need labs first. Please advise. Thanks.

## 2015-02-14 ENCOUNTER — Ambulatory Visit: Payer: BC Managed Care – PPO

## 2015-02-26 ENCOUNTER — Other Ambulatory Visit (INDEPENDENT_AMBULATORY_CARE_PROVIDER_SITE_OTHER): Payer: BC Managed Care – PPO

## 2015-02-26 DIAGNOSIS — E291 Testicular hypofunction: Secondary | ICD-10-CM | POA: Diagnosis not present

## 2015-02-26 LAB — CBC WITH DIFFERENTIAL/PLATELET
Basophils Absolute: 0 10*3/uL (ref 0.0–0.1)
Basophils Relative: 0.5 % (ref 0.0–3.0)
Eosinophils Absolute: 0.3 10*3/uL (ref 0.0–0.7)
Eosinophils Relative: 4.1 % (ref 0.0–5.0)
HCT: 50.2 % (ref 39.0–52.0)
Hemoglobin: 17.1 g/dL — ABNORMAL HIGH (ref 13.0–17.0)
LYMPHS ABS: 2 10*3/uL (ref 0.7–4.0)
Lymphocytes Relative: 27.3 % (ref 12.0–46.0)
MCHC: 34.1 g/dL (ref 30.0–36.0)
MCV: 92.6 fl (ref 78.0–100.0)
MONO ABS: 0.7 10*3/uL (ref 0.1–1.0)
Monocytes Relative: 9.4 % (ref 3.0–12.0)
Neutro Abs: 4.4 10*3/uL (ref 1.4–7.7)
Neutrophils Relative %: 58.7 % (ref 43.0–77.0)
PLATELETS: 193 10*3/uL (ref 150.0–400.0)
RBC: 5.42 Mil/uL (ref 4.22–5.81)
RDW: 12.9 % (ref 11.5–15.5)
WBC: 7.4 10*3/uL (ref 4.0–10.5)

## 2015-03-14 ENCOUNTER — Ambulatory Visit (INDEPENDENT_AMBULATORY_CARE_PROVIDER_SITE_OTHER): Payer: BC Managed Care – PPO | Admitting: Family Medicine

## 2015-03-14 DIAGNOSIS — E291 Testicular hypofunction: Secondary | ICD-10-CM

## 2015-03-14 MED ORDER — TESTOSTERONE CYPIONATE 200 MG/ML IM SOLN
200.0000 mg | Freq: Once | INTRAMUSCULAR | Status: AC
  Administered 2015-03-14: 200 mg via INTRAMUSCULAR

## 2015-03-28 ENCOUNTER — Ambulatory Visit (INDEPENDENT_AMBULATORY_CARE_PROVIDER_SITE_OTHER): Payer: BC Managed Care – PPO | Admitting: Family Medicine

## 2015-03-28 DIAGNOSIS — E291 Testicular hypofunction: Secondary | ICD-10-CM

## 2015-03-28 MED ORDER — TESTOSTERONE CYPIONATE 200 MG/ML IM SOLN
200.0000 mg | Freq: Once | INTRAMUSCULAR | Status: AC
  Administered 2015-03-28: 200 mg via INTRAMUSCULAR

## 2015-04-11 ENCOUNTER — Ambulatory Visit (INDEPENDENT_AMBULATORY_CARE_PROVIDER_SITE_OTHER): Payer: BC Managed Care – PPO | Admitting: *Deleted

## 2015-04-11 DIAGNOSIS — E291 Testicular hypofunction: Secondary | ICD-10-CM

## 2015-04-11 MED ORDER — TESTOSTERONE CYPIONATE 200 MG/ML IM SOLN
200.0000 mg | Freq: Once | INTRAMUSCULAR | Status: AC
  Administered 2015-04-11: 200 mg via INTRAMUSCULAR

## 2015-04-23 ENCOUNTER — Other Ambulatory Visit: Payer: Self-pay | Admitting: Geriatric Medicine

## 2015-04-23 MED ORDER — OMEPRAZOLE MAGNESIUM 20 MG PO TBEC: 20.0000 mg | DELAYED_RELEASE_TABLET | Freq: Every day | ORAL | Status: DC

## 2015-04-25 ENCOUNTER — Ambulatory Visit (INDEPENDENT_AMBULATORY_CARE_PROVIDER_SITE_OTHER): Payer: BC Managed Care – PPO | Admitting: *Deleted

## 2015-04-25 DIAGNOSIS — E291 Testicular hypofunction: Secondary | ICD-10-CM | POA: Diagnosis not present

## 2015-04-25 MED ORDER — TESTOSTERONE CYPIONATE 200 MG/ML IM SOLN
200.0000 mg | Freq: Once | INTRAMUSCULAR | Status: AC
Start: 2015-04-25 — End: 2015-04-25
  Administered 2015-04-25: 200 mg via INTRAMUSCULAR

## 2015-05-09 ENCOUNTER — Ambulatory Visit (INDEPENDENT_AMBULATORY_CARE_PROVIDER_SITE_OTHER): Payer: BC Managed Care – PPO | Admitting: Family Medicine

## 2015-05-09 DIAGNOSIS — R7989 Other specified abnormal findings of blood chemistry: Secondary | ICD-10-CM

## 2015-05-09 DIAGNOSIS — E291 Testicular hypofunction: Secondary | ICD-10-CM

## 2015-05-09 MED ORDER — TESTOSTERONE CYPIONATE 200 MG/ML IM SOLN
200.0000 mg | Freq: Once | INTRAMUSCULAR | Status: AC
  Administered 2015-05-09: 200 mg via INTRAMUSCULAR

## 2015-05-09 NOTE — Progress Notes (Signed)
Patient presents today for testosterone Injection. Patient tolerated injection well.

## 2015-05-23 ENCOUNTER — Ambulatory Visit (INDEPENDENT_AMBULATORY_CARE_PROVIDER_SITE_OTHER): Payer: BC Managed Care – PPO | Admitting: Family Medicine

## 2015-05-23 DIAGNOSIS — E291 Testicular hypofunction: Secondary | ICD-10-CM | POA: Diagnosis not present

## 2015-05-23 MED ORDER — TESTOSTERONE CYPIONATE 200 MG/ML IM SOLN
200.0000 mg | Freq: Once | INTRAMUSCULAR | Status: AC
  Administered 2015-05-23: 200 mg via INTRAMUSCULAR

## 2015-06-06 ENCOUNTER — Ambulatory Visit: Payer: BC Managed Care – PPO

## 2015-06-20 ENCOUNTER — Telehealth: Payer: Self-pay | Admitting: Pulmonary Disease

## 2015-06-20 ENCOUNTER — Other Ambulatory Visit (INDEPENDENT_AMBULATORY_CARE_PROVIDER_SITE_OTHER): Payer: BC Managed Care – PPO

## 2015-06-20 DIAGNOSIS — E782 Mixed hyperlipidemia: Secondary | ICD-10-CM | POA: Diagnosis not present

## 2015-06-20 DIAGNOSIS — Z23 Encounter for immunization: Secondary | ICD-10-CM

## 2015-06-20 DIAGNOSIS — E291 Testicular hypofunction: Secondary | ICD-10-CM | POA: Diagnosis not present

## 2015-06-20 DIAGNOSIS — Z125 Encounter for screening for malignant neoplasm of prostate: Secondary | ICD-10-CM | POA: Diagnosis not present

## 2015-06-20 DIAGNOSIS — Z0000 Encounter for general adult medical examination without abnormal findings: Secondary | ICD-10-CM

## 2015-06-20 LAB — COMPREHENSIVE METABOLIC PANEL
ALBUMIN: 4.2 g/dL (ref 3.5–5.2)
ALK PHOS: 72 U/L (ref 39–117)
ALT: 23 U/L (ref 0–53)
AST: 20 U/L (ref 0–37)
BUN: 11 mg/dL (ref 6–23)
CALCIUM: 9.2 mg/dL (ref 8.4–10.5)
CHLORIDE: 104 meq/L (ref 96–112)
CO2: 28 mEq/L (ref 19–32)
Creatinine, Ser: 0.96 mg/dL (ref 0.40–1.50)
GFR: 84.33 mL/min (ref >60.00)
Glucose, Bld: 106 mg/dL — ABNORMAL HIGH (ref 70–99)
POTASSIUM: 4.7 meq/L (ref 3.5–5.1)
SODIUM: 138 meq/L (ref 135–145)
TOTAL PROTEIN: 6.7 g/dL (ref 6.0–8.3)
Total Bilirubin: 0.4 mg/dL (ref 0.2–1.2)

## 2015-06-20 LAB — LIPID PANEL
CHOL/HDL RATIO: 4
CHOLESTEROL: 187 mg/dL (ref 0–200)
HDL: 42.1 mg/dL (ref >39.00)
LDL CALC: 113 mg/dL — AB (ref 0–99)
NonHDL: 145.07
TRIGLYCERIDES: 160 mg/dL — AB (ref 0.0–149.0)
VLDL: 32 mg/dL (ref 0.0–40.0)

## 2015-06-20 LAB — CBC WITH DIFFERENTIAL/PLATELET
Basophils Absolute: 0 10*3/uL (ref 0.0–0.1)
Basophils Relative: 0.6 % (ref 0.0–3.0)
EOS ABS: 0.3 10*3/uL (ref 0.0–0.7)
EOS PCT: 4.3 % (ref 0.0–5.0)
HEMATOCRIT: 52 % (ref 39.0–52.0)
HEMOGLOBIN: 17 g/dL (ref 13.0–17.0)
LYMPHS PCT: 33.6 % (ref 12.0–46.0)
Lymphs Abs: 2.1 10*3/uL (ref 0.7–4.0)
MCHC: 32.7 g/dL (ref 30.0–36.0)
MCV: 95.1 fl (ref 78.0–100.0)
MONO ABS: 0.6 10*3/uL (ref 0.1–1.0)
Monocytes Relative: 10 % (ref 3.0–12.0)
NEUTROS PCT: 51.5 % (ref 43.0–77.0)
Neutro Abs: 3.2 10*3/uL (ref 1.4–7.7)
Platelets: 186 10*3/uL (ref 150.0–400.0)
RBC: 5.47 Mil/uL (ref 4.22–5.81)
RDW: 13.6 % (ref 11.5–15.5)
WBC: 6.2 10*3/uL (ref 4.0–10.5)

## 2015-06-20 LAB — TESTOSTERONE: TESTOSTERONE: 352.85 ng/dL (ref 300.00–890.00)

## 2015-06-20 LAB — PSA: PSA: 2.95 ng/mL (ref 0.10–4.00)

## 2015-06-20 MED ORDER — PRAVASTATIN SODIUM 40 MG PO TABS: 40.0000 mg | ORAL_TABLET | Freq: Every day | ORAL | Status: DC

## 2015-06-20 NOTE — Telephone Encounter (Signed)
Called and spoke with pt Informed pt that office could schedule him for 07/10/15 with PM to do initial consult and then later switch to sleep doc Pt states being ok with this  Appt scheduled for pt  Nothing further is needed at this time.

## 2015-06-20 NOTE — Telephone Encounter (Signed)
Okay to schedule with any provider

## 2015-06-20 NOTE — Telephone Encounter (Signed)
No sleep docs have an opening before this visit.  Spoke with TP to see if she would be willing to see pt.  States she will see pt but to run this by sleep docs to see their recommendations.   Pt seen once by [facility] on 07/16/12 for Hypersomnia.  A home sleep test was ordered, but was not performed d/t insurance not covering it.  This was never followed up on.  Per pt he has to have an in-lab sleep test ordered and not a home sleep test.    Dr. Halford Chessman please advise on how to proceed with this patient.  Thanks!

## 2015-06-28 ENCOUNTER — Encounter: Payer: BC Managed Care – PPO | Admitting: Family Medicine

## 2015-07-05 ENCOUNTER — Encounter: Payer: Self-pay | Admitting: Family Medicine

## 2015-07-05 ENCOUNTER — Ambulatory Visit (INDEPENDENT_AMBULATORY_CARE_PROVIDER_SITE_OTHER): Payer: BC Managed Care – PPO | Admitting: Family Medicine

## 2015-07-05 VITALS — BP 129/85 | HR 68 | Temp 98.4°F | Resp 16 | Ht 69.75 in | Wt 217.0 lb

## 2015-07-05 DIAGNOSIS — Z0000 Encounter for general adult medical examination without abnormal findings: Secondary | ICD-10-CM

## 2015-07-05 DIAGNOSIS — E291 Testicular hypofunction: Secondary | ICD-10-CM

## 2015-07-05 MED ORDER — TESTOSTERONE CYPIONATE 200 MG/ML IM SOLN
200.0000 mg | Freq: Once | INTRAMUSCULAR | Status: AC
  Administered 2015-07-05: 200 mg via INTRAMUSCULAR

## 2015-07-05 NOTE — Progress Notes (Signed)
Office Note 07/05/2015  CC:  Chief Complaint  Patient presents with  . Annual Exam    Pt is not fasting. Labs were done on 06/20/15    HPI:  Tyler [name] is a 64 y.o. White male who is here for annual health maintenance exam. Pt has initiated appt with pulm to further eval for OSA.  Admits to less exercise and eating more fast food lately, also not 100% compliant with his statin.  He is in need of a routine eye exam by an eye doctor.  No known past eye dz/dx.   Past Medical History  Diagnosis Date  . Hyperlipidemia   . GERD (gastroesophageal reflux disease)   . Colon polyps 2013  . BPH (benign prostatic hypertrophy)   . Fatigue     + excessive daytime somnolence  . Hypogonadism male   . IFG (impaired fasting glucose)     A1c 5.8% 2014    Past Surgical History  Procedure Laterality Date  . Hydrocoelectomy    . Tonsillectomy and adenoidectomy    . Orbital fracture surgery    . Colonoscopy  1998 & 2013     Dr [name]; 2 benign polyps 2013  . Upper gi endoscopy  2000 & 2011    dilation 2000  . Nocturnal polysomn  1990s    Sleep lab: no OSA (??)--Dr. [name] said he needs another sleep study as of 2013    Family History  Problem Relation Age of Onset  . Heart attack Father 60     CHF  . Heart attack Maternal Grandfather     > 55  . Heart attack Paternal Grandfather     >55  . Colon cancer Neg Hx   . Stomach cancer Neg Hx   . Diabetes Neg Hx   . Stroke Neg Hx   . Cancer Mother     Gynecologic  . COPD Mother     Social History   Social History  . Marital Status: Married    Spouse Name: N/A  . Number of Children: 1  . Years of Education: N/A   Occupational History  . Not on file.   Social History Main Topics  . Smoking status: Never Smoker   . Smokeless tobacco: Never Used  . Alcohol Use: 0.6 oz/week    1 Cans of beer per week     Comment:  2 beers/ month  . Drug Use: No  . Sexual Activity: Not on file   Other Topics Concern  . Not on  file   Social History Narrative   Married, 1 son.   Lives in [place].   Product/process development scientist.   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          Outpatient Prescriptions Prior to Visit  Medication Sig Dispense Refill  . aspirin EC 81 MG tablet Take 1 tablet (81 mg total) by mouth daily. 30 tablet 0  . LYSINE HCL PO Take by mouth daily. Taking 2-3 x a week    . Multiple Vitamin (MULTIVITAMIN) tablet Take 1 tablet by mouth daily.    [name] omeprazole (PRILOSEC OTC) 20 MG tablet Take 1 tablet (20 mg total) by mouth daily. 30 tablet 3  . pravastatin (PRAVACHOL) 40 MG tablet Take 1 tablet (40 mg total) by mouth daily. 90 tablet 1  . sildenafil (VIAGRA) 100 MG tablet Take 1 tablet (100 mg total) by mouth daily as needed for erectile dysfunction. 6 tablet 6  . testosterone cypionate (DEPOTESTOTERONE CYPIONATE)  200 MG/ML injection Inject 1 mL (200 mg total) into the muscle every 14 (fourteen) days. 10 mL 5  . tamsulosin (FLOMAX) 0.4 MG CAPS capsule TAKE ONE CAPSULE BY MOUTH DAILY AFTER supper (Patient not taking: Reported on 07/05/2015) 30 capsule 11  . TURMERIC PO Take by mouth.     No facility-administered medications prior to visit.    Allergies  Allergen Reactions  . Sulfonamide Derivatives     REACTION: rash Because of a history of documented adverse serious drug reaction;Medi Alert bracelet  is recommended    ROS Review of Systems  Constitutional: Negative for fever, chills, appetite change and fatigue.  HENT: Negative for congestion, dental problem, ear pain and sore throat.   Eyes: Negative for discharge, redness and visual disturbance.  Respiratory: Negative for cough, chest tightness, shortness of breath and wheezing.   Cardiovascular: Negative for chest pain, palpitations and leg swelling.  Gastrointestinal: Negative for nausea, vomiting, abdominal pain, diarrhea and blood in stool.  Genitourinary: Negative for dysuria, urgency, frequency, hematuria, flank pain and difficulty  urinating.  Musculoskeletal: Negative for myalgias, back pain, joint swelling, arthralgias and neck stiffness.  Skin: Negative for pallor and rash.  Neurological: Negative for dizziness, speech difficulty, weakness and headaches.  Hematological: Negative for adenopathy. Does not bruise/bleed easily.  Psychiatric/Behavioral: Negative for confusion and sleep disturbance. The patient is not nervous/anxious.     PE; Blood pressure 129/85, pulse 68, temperature 98.4 F (36.9 C), temperature source Oral, resp. rate 16, height 5' 9.75" (1.772 m), weight 217 lb (98.431 kg), SpO2 91 %. Gen: Alert, well appearing.  Patient is oriented to person, place, time, and situation. AFFECT: pleasant, lucid thought and speech. ENT: Ears: EACs clear, normal epithelium.  TMs with good light reflex and landmarks bilaterally.  Eyes: no injection, icteris, swelling, or exudate.  EOMI, PERRLA. Nose: no drainage or turbinate edema/swelling.  No injection or focal lesion.  Mouth: lips without lesion/swelling.  Oral mucosa pink and moist.  Dentition intact and without obvious caries or gingival swelling.  Oropharynx without erythema, exudate, or swelling.  Neck: supple/nontender.  No LAD, mass, or TM.  Carotid pulses 2+ bilaterally, without bruits. CV: RRR, no m/r/g.   LUNGS: CTA bilat, nonlabored resps, good aeration in all lung fields. ABD: soft, NT, ND, BS normal.  No hepatospenomegaly or mass.  No bruits. EXT: no clubbing, cyanosis, or edema.  Musculoskeletal: no joint swelling, erythema, warmth, or tenderness.  ROM of all joints intact. Skin - no sores or suspicious lesions or rashes or color changes Rectal exam: negative without mass, lesions or tenderness, PROSTATE EXAM: smooth and symmetric without nodules or tenderness.   Pertinent labs:  Lab Results  Component Value Date   HGBA1C 5.7 06/28/2013    Lab Results  Component Value Date   TSH 2.13 06/19/2014   Lab Results  Component Value Date   WBC 6.2  06/20/2015   HGB 17.0 06/20/2015   HCT 52.0 06/20/2015   MCV 95.1 06/20/2015   PLT 186.0 06/20/2015   Lab Results  Component Value Date   CREATININE 0.96 06/20/2015   BUN 11 06/20/2015   NA 138 06/20/2015   K 4.7 06/20/2015   CL 104 06/20/2015   CO2 28 06/20/2015   Lab Results  Component Value Date   ALT 23 06/20/2015   AST 20 06/20/2015   ALKPHOS 72 06/20/2015   BILITOT 0.4 06/20/2015   Lab Results  Component Value Date   CHOL 187 06/20/2015   Lab Results  Component  Value Date   HDL 42.10 06/20/2015   Lab Results  Component Value Date   LDLCALC 113* 06/20/2015   Lab Results  Component Value Date   TRIG 160.0* 06/20/2015   Lab Results  Component Value Date   CHOLHDL 4 06/20/2015   Lab Results  Component Value Date   PSA 2.95 06/20/2015   PSA 2.70 06/19/2014   PSA 2.28 06/23/2013   Lab Results  Component Value Date   TESTOSTERONE 352.85 06/20/2015    ASSESSMENT AND PLAN:   Health maintenance exam: Reviewed age and gender appropriate health maintenance issues (prudent diet, regular exercise, health risks of tobacco and excessive alcohol, use of seatbelts, fire alarms in home, use of sunscreen).  Also reviewed age and gender appropriate health screening as well as vaccine recommendations. Encouraged pt to exercise more, eat healthier, and try for 100% compliance with statin. He needs to arrange a routine eye exam with an optometrist or ophthalmologist. Vaccines UTD. Reviewed recent health panel labs, testost level: all fine.  DRE normal today. Colon ca screening UTD.  An After Visit Summary was printed and given to the patient.   FOLLOW UP:  Return in about 6 months (around 01/03/2016) for routine chronic illness f/u.

## 2015-07-05 NOTE — Addendum Note (Signed)
Addended by: Lanae Crumbly on: 07/05/2015 11:40 AM   Modules accepted: Orders

## 2015-07-05 NOTE — Progress Notes (Signed)
Pre visit review using our clinic review tool, if applicable. No additional management support is needed unless otherwise documented below in the visit note. 

## 2015-07-10 ENCOUNTER — Encounter: Payer: Self-pay | Admitting: Pulmonary Disease

## 2015-07-10 ENCOUNTER — Ambulatory Visit (INDEPENDENT_AMBULATORY_CARE_PROVIDER_SITE_OTHER): Payer: BC Managed Care – PPO | Admitting: Pulmonary Disease

## 2015-07-10 VITALS — BP 126/78 | HR 65 | Ht 70.0 in | Wt 219.6 lb

## 2015-07-10 DIAGNOSIS — G4710 Hypersomnia, unspecified: Secondary | ICD-10-CM | POA: Diagnosis not present

## 2015-07-10 NOTE — Patient Instructions (Signed)
We'll schedule you for a sleep test.  We'll follow-up after the test for further evaluation and follow-up.

## 2015-07-10 NOTE — Progress Notes (Signed)
Subjective:    Patient ID: Tyler [name], male    DOB: [birth year], 64 y.o.   MRN: [id number]  HPI New evaluation for sleep apnea.  Tyler [name] is a 64 year old with past medical history of hyperlipidemia, GERD, BPH. He has had a sleep test in 06/11/00 which did not show any OSA. He had 10 obstructive events the whole night at that time. However he reports increasing symptoms of sleep apnea over the past 6 months.  He goes to bed between 11pm-1am. It takes him 30 minutes to fall asleep. He does not use any medications or supplements to help him sleep. He wakes up 3-5 times every night mainly to use the bathroom or because he has dry throat. He wakes up and gets out of bed at 6 AM. When he wakes up he is usually tired and has a headache. He sometimes uses an alarm clock to wake up. He is tired during the day with daytime somnolence. He uses coffee to keep him awake. 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 His wife tells him that he has loud snoring and has periods when he stops breathing at night. During sleep he frequently has a feeling that he cannot breath or that his throat is closing.  Epworth sleepiness score today- 17  Past Medical History  Diagnosis Date  . Hyperlipidemia   . GERD (gastroesophageal reflux disease)   . Colon polyps 2013  . BPH (benign prostatic hypertrophy)   . Fatigue     + excessive daytime somnolence  . Hypogonadism male   . IFG (impaired fasting glucose)     A1c 5.8% 2014    Current outpatient prescriptions:  .  aspirin EC 81 MG tablet, Take 1 tablet (81 mg total) by mouth daily., Disp: 30 tablet, Rfl: 0 .  LYSINE HCL PO, Take by mouth daily. Taking 2-3 x a week, Disp: , Rfl:  .  Multiple Vitamin (MULTIVITAMIN) tablet, Take 1 tablet by mouth daily., Disp: , Rfl:  .  omeprazole (PRILOSEC OTC) 20 MG tablet, Take 1 tablet (20 mg total) by mouth daily., Disp: 30 tablet, Rfl: 3 .  pravastatin (PRAVACHOL) 40 MG tablet, Take 1  tablet (40 mg total) by mouth daily., Disp: 90 tablet, Rfl: 1 .  sildenafil (VIAGRA) 100 MG tablet, Take 1 tablet (100 mg total) by mouth daily as needed for erectile dysfunction., Disp: 6 tablet, Rfl: 6 .  testosterone cypionate (DEPOTESTOTERONE CYPIONATE) 200 MG/ML injection, Inject 1 mL (200 mg total) into the muscle every 14 (fourteen) days., Disp: 10 mL, Rfl: 5   Review of Systems Denies any dyspnea, cough, sputum production, wheezing. Denies any chest pain, palpitations. Denies any nausea, vomiting, diarrhea, constipation. He has snoring at night, daytime somnolence, fatigue as noted above All other review of systems are negative    Objective:   Physical Exam Blood pressure 126/78, pulse 65, height 5\' 10"  (1.778 m), weight 219 lb 9.6 oz (99.61 kg), SpO2 97 %.  Gen.:No apparent distress, Awake, oriented  Neuro: No gross focal deficits. Neck: No JVD, lymphadenopathy, thyromegaly. RS: Clear, No wheeze or crackles. Non laboured breathing CVS: S1-S2 heard, no murmurs rubs gallops. Abdomen: Soft, positive bowel sounds. Extremities: No edema.    Assessment & Plan:  Evaluation for OSA.  I will order a s split split night sleep study.  Follow up in our office after the study for further eval and therapy.  Marshell Garfinkel MD [facility] Pager [phone]  [phone] If no answer or after 3pm call: [phone] 07/10/2015, 10:26 AM

## 2015-07-12 ENCOUNTER — Ambulatory Visit (HOSPITAL_BASED_OUTPATIENT_CLINIC_OR_DEPARTMENT_OTHER): Payer: BC Managed Care – PPO | Attending: Pulmonary Disease

## 2015-07-12 DIAGNOSIS — I493 Ventricular premature depolarization: Secondary | ICD-10-CM | POA: Diagnosis not present

## 2015-07-12 DIAGNOSIS — G4733 Obstructive sleep apnea (adult) (pediatric): Secondary | ICD-10-CM | POA: Diagnosis not present

## 2015-07-12 DIAGNOSIS — G4719 Other hypersomnia: Secondary | ICD-10-CM | POA: Diagnosis present

## 2015-07-12 DIAGNOSIS — G4710 Hypersomnia, unspecified: Secondary | ICD-10-CM

## 2015-07-12 DIAGNOSIS — R0683 Snoring: Secondary | ICD-10-CM | POA: Insufficient documentation

## 2015-07-12 DIAGNOSIS — R5383 Other fatigue: Secondary | ICD-10-CM | POA: Insufficient documentation

## 2015-07-16 DIAGNOSIS — G4733 Obstructive sleep apnea (adult) (pediatric): Secondary | ICD-10-CM | POA: Diagnosis not present

## 2015-07-16 NOTE — Progress Notes (Signed)
Patient Name: Tyler [name], [place] Date: 07/12/2015 Gender: Male D.O.B: 06-21-1951 Age (years): 26 Referring Provider: Marshell Garfinkel Height (inches): 70 Interpreting Physician: Chesley Mires MD, ABSM Weight (lbs): 218 RPSGT: Joni Reining BMI: 31 MRN: [id number] Neck Size: 17.50  CLINICAL INFORMATION Sleep Study Type: NPSG Indication for sleep study: Excessive Daytime Sleepiness, Fatigue, Snoring, Witnessed Apneas Epworth Sleepiness Score: 17  SLEEP STUDY TECHNIQUE As per the AASM Manual for the Scoring of Sleep and Associated Events v2.3 (April 2016) with a hypopnea requiring 4% desaturations. The channels recorded and monitored were frontal, central and occipital EEG, electrooculogram (EOG), submentalis EMG (chin), nasal and oral airflow, thoracic and abdominal wall motion, anterior tibialis EMG, snore microphone, electrocardiogram, and pulse oximetry.  MEDICATIONS Patient's medications include: reviewed in electronic medical record. Medications self-administered by patient during sleep study : No sleep medicine administered.  SLEEP ARCHITECTURE The study was initiated at 10:57:04 PM and ended at 5:03:05 AM. Sleep onset time was 20.4 minutes and the sleep efficiency was 82.9%. The total sleep time was 303.6 minutes. Stage REM latency was 71.5 minutes. The patient spent 2.64% of the night in stage N1 sleep, 45.49% in stage N2 sleep, 22.40% in stage N3 and 29.48% in REM. Alpha intrusion was absent. Supine sleep was 14.66%.  RESPIRATORY PARAMETERS The overall apnea/hypopnea index (AHI) was 11.5 per hour. There were 49 total apneas, including 49 obstructive, 0 central and 0 mixed apneas. There were 9 hypopneas and 14 RERAs. The AHI during Stage REM sleep was 2.0 per hour. AHI while supine was 74.2 per hour. The mean oxygen saturation was 94.19%. The minimum SpO2 during sleep was 88.00%. Moderate snoring was noted during this study.  CARDIAC DATA The 2 lead EKG demonstrated  sinus rhythm. The mean heart rate was 63.86 beats per minute. Other EKG findings include: PVCs.  LEG MOVEMENT DATA The total PLMS were 0 with a resulting PLMS index of 0.00. Associated arousal with leg movement index was 0.0 .  IMPRESSIONS This study showed mild obstructive sleep apnea with an AHI of 11.5 and SaO2 low of 88%.  DIAGNOSIS - Obstructive Sleep Apnea (327.23 [G47.33 ICD-10])  RECOMMENDATIONS Additional therapies include weight loss, CPAP, oral appliance, or surgical assessment.  Chesley Mires, MD, [place], American Board of Sleep Medicine 07/16/2015, 1:25 PM  NPI: [id number]

## 2015-07-18 ENCOUNTER — Ambulatory Visit (INDEPENDENT_AMBULATORY_CARE_PROVIDER_SITE_OTHER): Payer: BC Managed Care – PPO | Admitting: Family Medicine

## 2015-07-18 DIAGNOSIS — E291 Testicular hypofunction: Secondary | ICD-10-CM

## 2015-07-18 MED ORDER — TESTOSTERONE CYPIONATE 200 MG/ML IM SOLN
200.0000 mg | Freq: Once | INTRAMUSCULAR | Status: AC
  Administered 2015-07-18: 200 mg via INTRAMUSCULAR

## 2015-08-01 ENCOUNTER — Ambulatory Visit (INDEPENDENT_AMBULATORY_CARE_PROVIDER_SITE_OTHER): Payer: BC Managed Care – PPO | Admitting: *Deleted

## 2015-08-01 DIAGNOSIS — E291 Testicular hypofunction: Secondary | ICD-10-CM

## 2015-08-01 DIAGNOSIS — E349 Endocrine disorder, unspecified: Secondary | ICD-10-CM

## 2015-08-01 MED ORDER — TESTOSTERONE CYPIONATE 200 MG/ML IM SOLN
200.0000 mg | Freq: Once | INTRAMUSCULAR | Status: AC
  Administered 2015-08-01: 200 mg via INTRAMUSCULAR

## 2015-08-01 NOTE — Progress Notes (Signed)
Pateint presents today for testosterone injection. Patient tolerated well.

## 2015-08-10 ENCOUNTER — Ambulatory Visit (INDEPENDENT_AMBULATORY_CARE_PROVIDER_SITE_OTHER): Payer: BC Managed Care – PPO | Admitting: Pulmonary Disease

## 2015-08-10 ENCOUNTER — Encounter: Payer: Self-pay | Admitting: Pulmonary Disease

## 2015-08-10 VITALS — BP 136/80 | HR 64 | Temp 97.7°F | Ht 70.0 in | Wt 223.0 lb

## 2015-08-10 DIAGNOSIS — G4733 Obstructive sleep apnea (adult) (pediatric): Secondary | ICD-10-CM | POA: Insufficient documentation

## 2015-08-10 NOTE — Progress Notes (Signed)
Subjective:    Patient ID: Tyler [name], male    DOB: [birth year], 64 y.o.   MRN: [id number]  PROBLEM LIST Mild OSA  HPI Tyler [name] is a 64 year old with past medical history of hyperlipidemia, GERD, BPH. He has had a sleep test in 06/11/00 which did not show any OSA. He had 10 obstructive events the whole night at that time. However he reports increasing symptoms of sleep apnea over the past 6 months.  He had a repeat sleep test done on 07/12/15 which showed mild OSA with AHI of 11.5 and SaO2 low of 88%.  He goes to bed between 11pm-1am. It takes him 30 minutes to fall asleep. He does not use any medications or supplements to help him sleep. He wakes up 3-5 times every night mainly to use the bathroom or because he has dry throat. He wakes up and gets out of bed at 6 AM. When he wakes up he is usually tired and has a headache. He sometimes uses an alarm clock to wake up. He is tired during the day with daytime somnolence. He uses coffee to keep him awake. 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 His wife tells him that he has loud snoring and has periods when he stops breathing at night. During sleep he frequently has a feeling that he cannot breath or that his throat is closing.  Epworth sleepiness score 07/10/15- 62  Past Medical History  Diagnosis Date  . Hyperlipidemia   . GERD (gastroesophageal reflux disease)   . Colon polyps 2013  . BPH (benign prostatic hypertrophy)   . Fatigue     + excessive daytime somnolence  . Hypogonadism male   . IFG (impaired fasting glucose)     A1c 5.8% 2014    Current outpatient prescriptions:  .  aspirin EC 81 MG tablet, Take 1 tablet (81 mg total) by mouth daily., Disp: 30 tablet, Rfl: 0 .  LYSINE HCL PO, Take by mouth daily. Taking 2-3 x a week, Disp: , Rfl:  .  Multiple Vitamin (MULTIVITAMIN) tablet, Take 1 tablet by mouth daily., Disp: , Rfl:  .  omeprazole (PRILOSEC OTC) 20 MG tablet, Take 1 tablet (20 mg  total) by mouth daily., Disp: 30 tablet, Rfl: 3 .  pravastatin (PRAVACHOL) 40 MG tablet, Take 1 tablet (40 mg total) by mouth daily., Disp: 90 tablet, Rfl: 1 .  sildenafil (VIAGRA) 100 MG tablet, Take 1 tablet (100 mg total) by mouth daily as needed for erectile dysfunction., Disp: 6 tablet, Rfl: 6 .  testosterone cypionate (DEPOTESTOTERONE CYPIONATE) 200 MG/ML injection, Inject 1 mL (200 mg total) into the muscle every 14 (fourteen) days., Disp: 10 mL, Rfl: 5   Review of Systems Denies any dyspnea, cough, sputum production, wheezing. Denies any chest pain, palpitations. Denies any nausea, vomiting, diarrhea, constipation. He has snoring at night, daytime somnolence, fatigue as noted above All other review of systems are negative    Objective:   Physical Exam Blood pressure 136/80, pulse 64, temperature 97.7 F (36.5 C), temperature source Oral, height 5\' 10"  (1.778 m), weight 223 lb (101.152 kg), SpO2 97 %.  Gen.:No apparent distress, Awake, oriented  Neuro: No gross focal deficits. Neck: No JVD, lymphadenopathy, thyromegaly. RS: Clear, No wheeze or crackles. Non laboured breathing CVS: S1-S2 heard, no murmurs rubs gallops. Abdomen: Soft, positive bowel sounds. Extremities: No edema.    Assessment & Plan:  Mild OSA, AHI 11.5  Started on AutoSet CPAP  Will  schedule for mask fitting Data download after 1 month to evaluate efficacy  Marshell Garfinkel MD  [facility] Pager [phone] If no answer or after 3pm call: [phone] 08/10/2015, 3:01 PM

## 2015-08-10 NOTE — Patient Instructions (Signed)
We will order an AutoSet CPAP and you will be fitted for a mask.  Return to clinic in 3 months

## 2015-08-14 ENCOUNTER — Telehealth: Payer: Self-pay | Admitting: Pulmonary Disease

## 2015-08-14 DIAGNOSIS — G4733 Obstructive sleep apnea (adult) (pediatric): Secondary | ICD-10-CM

## 2015-08-14 NOTE — Telephone Encounter (Signed)
Spoke with Melissa at [facility], states that pt's cpap order needs to have a pressure range.  Cannot just say autotitrating but must have a numeric pressure range.  PM also needs to sign this order in Epic after it's been ordered so [facility] can go ahead and process this order.  Last ov note just says autoset cpap, no range noted.   Dr. Vaughan Browner please advise on pressure settings for pt.  Thanks!

## 2015-08-15 ENCOUNTER — Ambulatory Visit (INDEPENDENT_AMBULATORY_CARE_PROVIDER_SITE_OTHER): Payer: BC Managed Care – PPO | Admitting: Family Medicine

## 2015-08-15 DIAGNOSIS — E291 Testicular hypofunction: Secondary | ICD-10-CM

## 2015-08-15 MED ORDER — TESTOSTERONE CYPIONATE 200 MG/ML IM SOLN
200.0000 mg | Freq: Once | INTRAMUSCULAR | Status: AC
  Administered 2015-08-15: 200 mg via INTRAMUSCULAR

## 2015-08-15 NOTE — Telephone Encounter (Signed)
Pressure range 5-15. Thanks

## 2015-08-16 NOTE — Telephone Encounter (Signed)
New order with updated info placed.  Nothing further needed.

## 2015-08-29 ENCOUNTER — Ambulatory Visit (INDEPENDENT_AMBULATORY_CARE_PROVIDER_SITE_OTHER): Payer: BC Managed Care – PPO | Admitting: Family Medicine

## 2015-08-29 DIAGNOSIS — E291 Testicular hypofunction: Secondary | ICD-10-CM | POA: Diagnosis not present

## 2015-08-29 MED ORDER — TESTOSTERONE CYPIONATE 200 MG/ML IM SOLN
200.0000 mg | Freq: Once | INTRAMUSCULAR | Status: AC
  Administered 2015-08-29: 200 mg via INTRAMUSCULAR

## 2015-09-04 ENCOUNTER — Telehealth: Payer: Self-pay | Admitting: Pulmonary Disease

## 2015-09-04 DIAGNOSIS — G4733 Obstructive sleep apnea (adult) (pediatric): Secondary | ICD-10-CM

## 2015-09-04 NOTE — Telephone Encounter (Signed)
Last seen on 08/10/15 with PM   Patient Instructions       We will order an AutoSet CPAP and you will be fitted for a mask.  Return to clinic in 3 months   Called and spoke with patient. He c/o CPAP pressure being to high. He states that since starting the CPAP machine on 08/23/15 his left eye has redness. He states he has a Left deviated septum and wonders if the pressure being at 15 is too high causing the redness in his eye. Patient states he has not used CPAP the last three nights due to a cold but would like recommendations on pressure. Per phone note on 08/14/15 PM set pressure setting to 5-15. Will send message to VS in PM's absence  VS please advise

## 2015-09-04 NOTE — Telephone Encounter (Signed)
Can change CPAP to 5 to 10 cm H2O.  Have download sent 2 weeks after pressure change.

## 2015-09-04 NOTE — Telephone Encounter (Signed)
Called and spoke with patient. Informed him of VS's recs. Pt voiced understanding and had no further questions. Order for pressure change placed.

## 2015-09-19 ENCOUNTER — Ambulatory Visit (INDEPENDENT_AMBULATORY_CARE_PROVIDER_SITE_OTHER): Payer: BC Managed Care – PPO | Admitting: Family Medicine

## 2015-09-19 DIAGNOSIS — E291 Testicular hypofunction: Secondary | ICD-10-CM | POA: Diagnosis not present

## 2015-09-19 MED ORDER — TESTOSTERONE CYPIONATE 200 MG/ML IM SOLN
200.0000 mg | Freq: Once | INTRAMUSCULAR | Status: AC
  Administered 2015-09-19: 200 mg via INTRAMUSCULAR

## 2015-09-26 ENCOUNTER — Encounter (HOSPITAL_BASED_OUTPATIENT_CLINIC_OR_DEPARTMENT_OTHER): Payer: BC Managed Care – PPO

## 2015-10-03 ENCOUNTER — Ambulatory Visit (INDEPENDENT_AMBULATORY_CARE_PROVIDER_SITE_OTHER): Payer: BC Managed Care – PPO | Admitting: *Deleted

## 2015-10-03 DIAGNOSIS — E291 Testicular hypofunction: Secondary | ICD-10-CM

## 2015-10-03 MED ORDER — TESTOSTERONE CYPIONATE 200 MG/ML IM SOLN
200.0000 mg | Freq: Once | INTRAMUSCULAR | Status: AC
  Administered 2015-10-03: 200 mg via INTRAMUSCULAR

## 2015-10-17 ENCOUNTER — Ambulatory Visit: Payer: BC Managed Care – PPO

## 2015-10-24 ENCOUNTER — Telehealth: Payer: Self-pay | Admitting: Pulmonary Disease

## 2015-10-24 DIAGNOSIS — G4733 Obstructive sleep apnea (adult) (pediatric): Secondary | ICD-10-CM

## 2015-10-24 NOTE — Telephone Encounter (Signed)
Auto CPAP 09/02/15 to 10/01/15 >> used on 23 of 30 nights with average 4 hrs and 42 min.  Average AHI is 12.7.  Please inform pt that sleep apnea number remains elevated on CPAP report.  He should continue with auto CPAP, but might need to have pressure range increased if he is tolerating CPAP better now >> this download was done with pt using auto CPAP range 5 to 10 cm H2O.  Will route to Dr. Vaughan Browner also, since pt is now being followed by Dr. Vaughan Browner in place of Dr. [name].

## 2015-10-25 NOTE — Telephone Encounter (Signed)
LM x 1 

## 2015-10-26 NOTE — Telephone Encounter (Signed)
Please send order to have his DME change his auto CPAP to range of 5 to 12 cm H2O.

## 2015-10-26 NOTE — Telephone Encounter (Signed)
Spoke with pt. He is aware of download results. States that he is tolerating CPAP better than he was in the beginning. He is willing to have his pressure changed.  PM/VS - please advise. Thanks.

## 2015-10-26 NOTE — Telephone Encounter (Signed)
Spoke with pt and is aware of recs. Order placed. Nothing further needed

## 2015-10-31 ENCOUNTER — Other Ambulatory Visit: Payer: Self-pay | Admitting: *Deleted

## 2015-10-31 ENCOUNTER — Ambulatory Visit (INDEPENDENT_AMBULATORY_CARE_PROVIDER_SITE_OTHER): Payer: BC Managed Care – PPO | Admitting: *Deleted

## 2015-10-31 DIAGNOSIS — E291 Testicular hypofunction: Secondary | ICD-10-CM

## 2015-10-31 DIAGNOSIS — R7989 Other specified abnormal findings of blood chemistry: Secondary | ICD-10-CM

## 2015-10-31 MED ORDER — TESTOSTERONE CYPIONATE 200 MG/ML IM SOLN
200.0000 mg | Freq: Once | INTRAMUSCULAR | Status: AC
  Administered 2015-10-31: 200 mg via INTRAMUSCULAR

## 2015-10-31 MED ORDER — OMEPRAZOLE 20 MG PO CPDR: 20.0000 mg | DELAYED_RELEASE_CAPSULE | Freq: Every day | ORAL | Status: DC

## 2015-10-31 NOTE — Progress Notes (Signed)
Patient presents today for Testosterone injection.

## 2015-10-31 NOTE — Telephone Encounter (Signed)
Received fax from pharmacy stating that pts insurance no longer covered OTC medications. Per Dr. Anitra Lauth okay to send new Rx for prescription Prilosec 20mg  take one daily #30 w/ 11RF. Rx sent. Pt advised and voiced understanding.

## 2015-11-14 ENCOUNTER — Ambulatory Visit: Payer: BC Managed Care – PPO

## 2015-11-15 ENCOUNTER — Ambulatory Visit (INDEPENDENT_AMBULATORY_CARE_PROVIDER_SITE_OTHER): Payer: BC Managed Care – PPO | Admitting: *Deleted

## 2015-11-15 DIAGNOSIS — E291 Testicular hypofunction: Secondary | ICD-10-CM | POA: Diagnosis not present

## 2015-11-15 DIAGNOSIS — R7989 Other specified abnormal findings of blood chemistry: Secondary | ICD-10-CM

## 2015-11-15 MED ORDER — TESTOSTERONE CYPIONATE 100 MG/ML IM SOLN
100.0000 mg | INTRAMUSCULAR | Status: DC
  Administered 2015-11-15: 100 mg via INTRAMUSCULAR

## 2015-11-20 ENCOUNTER — Institutional Professional Consult (permissible substitution): Payer: BC Managed Care – PPO | Admitting: Pulmonary Disease

## 2015-11-29 ENCOUNTER — Ambulatory Visit (INDEPENDENT_AMBULATORY_CARE_PROVIDER_SITE_OTHER): Payer: BC Managed Care – PPO | Admitting: Family Medicine

## 2015-11-29 DIAGNOSIS — E291 Testicular hypofunction: Secondary | ICD-10-CM

## 2015-11-29 MED ORDER — TESTOSTERONE CYPIONATE 200 MG/ML IM SOLN
200.0000 mg | Freq: Once | INTRAMUSCULAR | Status: AC
  Administered 2015-11-29: 200 mg via INTRAMUSCULAR

## 2015-11-29 NOTE — Progress Notes (Signed)
Patient presents today for testosterone injection. Patient tolerated injection well.

## 2015-12-13 ENCOUNTER — Ambulatory Visit (INDEPENDENT_AMBULATORY_CARE_PROVIDER_SITE_OTHER): Payer: BC Managed Care – PPO | Admitting: *Deleted

## 2015-12-13 DIAGNOSIS — E291 Testicular hypofunction: Secondary | ICD-10-CM

## 2015-12-13 DIAGNOSIS — R7989 Other specified abnormal findings of blood chemistry: Secondary | ICD-10-CM

## 2015-12-13 MED ORDER — TESTOSTERONE CYPIONATE 200 MG/ML IM SOLN
200.0000 mg | Freq: Once | INTRAMUSCULAR | Status: AC
  Administered 2015-12-13: 200 mg via INTRAMUSCULAR

## 2015-12-13 NOTE — Progress Notes (Signed)
Patient present today for testosterone injection. Patient tolerated injection well.

## 2015-12-20 ENCOUNTER — Other Ambulatory Visit: Payer: Self-pay | Admitting: *Deleted

## 2015-12-20 DIAGNOSIS — E782 Mixed hyperlipidemia: Secondary | ICD-10-CM

## 2015-12-20 MED ORDER — PRAVASTATIN SODIUM 40 MG PO TABS: 40.0000 mg | ORAL_TABLET | Freq: Every day | ORAL | Status: DC

## 2015-12-20 NOTE — Telephone Encounter (Signed)
RF request for pravastatin LOV: 07/05/15 Next ov: 01/03/16 Last written: 06/20/15 #90 w/ 1RF

## 2015-12-27 ENCOUNTER — Ambulatory Visit (INDEPENDENT_AMBULATORY_CARE_PROVIDER_SITE_OTHER): Payer: BC Managed Care – PPO | Admitting: *Deleted

## 2015-12-27 DIAGNOSIS — R7989 Other specified abnormal findings of blood chemistry: Secondary | ICD-10-CM

## 2015-12-27 DIAGNOSIS — E291 Testicular hypofunction: Secondary | ICD-10-CM

## 2015-12-27 MED ORDER — TESTOSTERONE CYPIONATE 200 MG/ML IM SOLN
200.0000 mg | Freq: Once | INTRAMUSCULAR | Status: AC
  Administered 2015-12-27: 200 mg via INTRAMUSCULAR

## 2015-12-27 NOTE — Progress Notes (Signed)
Patient presents today for testosterone injection per Dr Isla Pence order. Patient tolerated injection well.

## 2016-01-03 ENCOUNTER — Encounter: Payer: Self-pay | Admitting: Family Medicine

## 2016-01-03 ENCOUNTER — Ambulatory Visit (INDEPENDENT_AMBULATORY_CARE_PROVIDER_SITE_OTHER): Payer: BC Managed Care – PPO | Admitting: Family Medicine

## 2016-01-03 VITALS — BP 129/83 | HR 67 | Temp 98.1°F | Resp 16 | Ht 70.0 in | Wt 230.0 lb

## 2016-01-03 DIAGNOSIS — R7301 Impaired fasting glucose: Secondary | ICD-10-CM | POA: Diagnosis not present

## 2016-01-03 DIAGNOSIS — E782 Mixed hyperlipidemia: Secondary | ICD-10-CM

## 2016-01-03 DIAGNOSIS — E291 Testicular hypofunction: Secondary | ICD-10-CM

## 2016-01-03 DIAGNOSIS — K219 Gastro-esophageal reflux disease without esophagitis: Secondary | ICD-10-CM | POA: Diagnosis not present

## 2016-01-03 LAB — CBC WITH DIFFERENTIAL/PLATELET
BASOS PCT: 0.5 % (ref 0.0–3.0)
Basophils Absolute: 0 10*3/uL (ref 0.0–0.1)
EOS ABS: 0.2 10*3/uL (ref 0.0–0.7)
Eosinophils Relative: 3.5 % (ref 0.0–5.0)
HCT: 48.8 % (ref 39.0–52.0)
Hemoglobin: 16.4 g/dL (ref 13.0–17.0)
LYMPHS ABS: 2.1 10*3/uL (ref 0.7–4.0)
Lymphocytes Relative: 31.6 % (ref 12.0–46.0)
MCHC: 33.7 g/dL (ref 30.0–36.0)
MCV: 92.4 fl (ref 78.0–100.0)
MONO ABS: 0.6 10*3/uL (ref 0.1–1.0)
Monocytes Relative: 9.1 % (ref 3.0–12.0)
NEUTROS ABS: 3.7 10*3/uL (ref 1.4–7.7)
NEUTROS PCT: 55.3 % (ref 43.0–77.0)
PLATELETS: 204 10*3/uL (ref 150.0–400.0)
RBC: 5.28 Mil/uL (ref 4.22–5.81)
RDW: 14 % (ref 11.5–15.5)
WBC: 6.7 10*3/uL (ref 4.0–10.5)

## 2016-01-03 LAB — BASIC METABOLIC PANEL
BUN: 12 mg/dL (ref 6–23)
CHLORIDE: 106 meq/L (ref 96–112)
CO2: 26 mEq/L (ref 19–32)
CREATININE: 1.03 mg/dL (ref 0.40–1.50)
Calcium: 9.3 mg/dL (ref 8.4–10.5)
GFR: 77.62 mL/min (ref >60.00)
GLUCOSE: 108 mg/dL — AB (ref 70–99)
Potassium: 4.4 mEq/L (ref 3.5–5.1)
Sodium: 140 mEq/L (ref 135–145)

## 2016-01-03 LAB — LIPID PANEL
CHOLESTEROL: 172 mg/dL (ref 0–200)
HDL: 39.5 mg/dL (ref >39.00)
LDL Cholesterol: 106 mg/dL — ABNORMAL HIGH (ref 0–99)
NONHDL: 132.9
Total CHOL/HDL Ratio: 4
Triglycerides: 136 mg/dL (ref 0.0–149.0)
VLDL: 27.2 mg/dL (ref 0.0–40.0)

## 2016-01-03 LAB — HEMOGLOBIN A1C: HEMOGLOBIN A1C: 5.8 % (ref 4.6–6.5)

## 2016-01-03 MED ORDER — TESTOSTERONE CYPIONATE 200 MG/ML IM SOLN: 200.0000 mg | INTRAMUSCULAR | Status: DC

## 2016-01-03 NOTE — Progress Notes (Signed)
OFFICE VISIT  01/03/2016   CC:  Chief Complaint  Patient presents with  . Follow-up    Pt is fasting.      HPI:    Patient is a 65 y.o. Caucasian male who presents for 6 mo f/u HLD, male hypogonadism, and IFG. Feeling well, no acute complaints.  Takes pravastatin daily, without any side effect. Takes prilosec 20mg  qAM.  Hx of remote esoph stricture.  No dysphagia. Testost 200 mg IM q 2 wks.  Says he gets good response regarding energy level/libido.  Most recent injection was 1 week ago. Taking an 81mg  ASA qd.  Not eating a good diet right now. Not exercising much right now.   Past Medical History  Diagnosis Date  . Hyperlipidemia   . GERD (gastroesophageal reflux disease)   . Colon polyps 2013  . BPH (benign prostatic hypertrophy)   . Fatigue     + excessive daytime somnolence  . Hypogonadism male   . IFG (impaired fasting glucose)     A1c 5.8% 2014    Past Surgical History  Procedure Laterality Date  . Hydrocoelectomy    . Tonsillectomy and adenoidectomy    . Orbital fracture surgery    . Colonoscopy  1998 & 2013     Dr [name]; 2 benign polyps 2013  . Upper gi endoscopy  2000 & 2011    dilation 2000  . Nocturnal polysomn  1990s    Sleep lab: no OSA (??)--Dr. [name] said he needs another sleep study as of 2013   MEDS: also taking testosteron cypionate 200 mg/ml, 1 ml q 2 weeks IM Outpatient Prescriptions Prior to Visit  Medication Sig Dispense Refill  . aspirin EC 81 MG tablet Take 1 tablet (81 mg total) by mouth daily. 30 tablet 0  . LYSINE HCL PO Take by mouth daily. Taking 2-3 x a week    . Multiple Vitamin (MULTIVITAMIN) tablet Take 1 tablet by mouth daily.    [name] omeprazole (PRILOSEC) 20 MG capsule Take 1 capsule (20 mg total) by mouth daily. 30 capsule 11  . pravastatin (PRAVACHOL) 40 MG tablet Take 1 tablet (40 mg total) by mouth daily. 90 tablet 3  . sildenafil (VIAGRA) 100 MG tablet Take 1 tablet (100 mg total) by mouth daily as needed for erectile  dysfunction. 6 tablet 6   No facility-administered medications prior to visit.    Allergies  Allergen Reactions  . Sulfonamide Derivatives     REACTION: rash Because of a history of documented adverse serious drug reaction;Medi Alert bracelet  is recommended    ROS As per HPI  PE: Blood pressure 129/83, pulse 67, temperature 98.1 F (36.7 C), temperature source Oral, resp. rate 16, height 5\' 10"  (1.778 m), weight 230 lb (104.327 kg), SpO2 95 %. Gen: Alert, well appearing.  Patient is oriented to person, place, time, and situation. CV: RRR, no m/r/g.   LUNGS: CTA bilat, nonlabored resps, good aeration in all lung fields. EXT: no clubbing, cyanosis, or edema.    LABS:  Lab Results  Component Value Date   TSH 2.13 06/19/2014   Lab Results  Component Value Date   WBC 6.2 06/20/2015   HGB 17.0 06/20/2015   HCT 52.0 06/20/2015   MCV 95.1 06/20/2015   PLT 186.0 06/20/2015   Lab Results  Component Value Date   CREATININE 0.96 06/20/2015   BUN 11 06/20/2015   NA 138 06/20/2015   K 4.7 06/20/2015   CL 104 06/20/2015   CO2 28  06/20/2015   Lab Results  Component Value Date   ALT 23 06/20/2015   AST 20 06/20/2015   ALKPHOS 72 06/20/2015   BILITOT 0.4 06/20/2015   Lab Results  Component Value Date   CHOL 187 06/20/2015   Lab Results  Component Value Date   HDL 42.10 06/20/2015   Lab Results  Component Value Date   LDLCALC 113* 06/20/2015   Lab Results  Component Value Date   TRIG 160.0* 06/20/2015   Lab Results  Component Value Date   CHOLHDL 4 06/20/2015   Lab Results  Component Value Date   PSA 2.95 06/20/2015   PSA 2.70 06/19/2014   PSA 2.28 06/23/2013   Lab Results  Component Value Date   TESTOSTERONE 352.85 06/20/2015   Lab Results  Component Value Date   HGBA1C 5.7 06/28/2013   IMPRESSION AND PLAN:  1) Male hypogonadism; The current medical regimen is effective;  continue present plan and medications. Last testost check was  06/2015---I'll recheck this again at next f/u in 6 mo. Will follow Hb.  2) Hyperlipidemia: tolerating statin.  Recheck FLP today.  AST/ALT normal 6 mo ago.  3) GERD: hx of esoph stricture.  Stable on prilosec 20mg  qd.  4) Hx of IFG: BMET and HbA1c today. Discussed TLC today.  An After Visit Summary was printed and given to the patient.  FOLLOW UP: Return in about 6 months (around 07/04/2016) for annual CPE (fasting).  Signed:  Crissie Sickles, MD           01/03/2016

## 2016-01-03 NOTE — Progress Notes (Signed)
Pre visit review using our clinic review tool, if applicable. No additional management support is needed unless otherwise documented below in the visit note. 

## 2016-01-10 ENCOUNTER — Ambulatory Visit (INDEPENDENT_AMBULATORY_CARE_PROVIDER_SITE_OTHER): Payer: BC Managed Care – PPO | Admitting: *Deleted

## 2016-01-10 DIAGNOSIS — E291 Testicular hypofunction: Secondary | ICD-10-CM | POA: Diagnosis not present

## 2016-01-10 MED ORDER — TESTOSTERONE CYPIONATE 200 MG/ML IM SOLN
200.0000 mg | Freq: Once | INTRAMUSCULAR | Status: AC
  Administered 2016-01-10: 200 mg via INTRAMUSCULAR

## 2016-01-10 NOTE — Progress Notes (Signed)
Patient presents today for testosterone injection per Dr Idelle Leech order. Patient tolerated injection well. Next injection in 2 weeks.

## 2016-01-24 ENCOUNTER — Ambulatory Visit (INDEPENDENT_AMBULATORY_CARE_PROVIDER_SITE_OTHER): Payer: BC Managed Care – PPO | Admitting: *Deleted

## 2016-01-24 DIAGNOSIS — E291 Testicular hypofunction: Secondary | ICD-10-CM | POA: Diagnosis not present

## 2016-01-24 MED ORDER — TESTOSTERONE CYPIONATE 200 MG/ML IM SOLN
200.0000 mg | Freq: Once | INTRAMUSCULAR | Status: AC
  Administered 2016-01-24: 200 mg via INTRAMUSCULAR

## 2016-01-24 NOTE — Progress Notes (Signed)
Patient presents today for B12 injection per Dr Idelle Leech orders. Patient tolerated injection well next injection in 2 weeks.

## 2016-02-07 ENCOUNTER — Ambulatory Visit (INDEPENDENT_AMBULATORY_CARE_PROVIDER_SITE_OTHER): Payer: BC Managed Care – PPO | Admitting: Family Medicine

## 2016-02-07 DIAGNOSIS — E291 Testicular hypofunction: Secondary | ICD-10-CM | POA: Diagnosis not present

## 2016-02-07 MED ORDER — TESTOSTERONE CYPIONATE 200 MG/ML IM SOLN
200.0000 mg | Freq: Once | INTRAMUSCULAR | Status: AC
  Administered 2016-02-07: 200 mg via INTRAMUSCULAR

## 2016-02-21 ENCOUNTER — Ambulatory Visit (INDEPENDENT_AMBULATORY_CARE_PROVIDER_SITE_OTHER): Payer: BC Managed Care – PPO | Admitting: *Deleted

## 2016-02-21 DIAGNOSIS — E291 Testicular hypofunction: Secondary | ICD-10-CM

## 2016-02-21 MED ORDER — TESTOSTERONE CYPIONATE 200 MG/ML IM SOLN
200.0000 mg | Freq: Once | INTRAMUSCULAR | Status: AC
  Administered 2016-02-21: 200 mg via INTRAMUSCULAR

## 2016-03-06 ENCOUNTER — Ambulatory Visit: Payer: BC Managed Care – PPO

## 2016-04-08 DIAGNOSIS — M205X9 Other deformities of toe(s) (acquired), unspecified foot: Secondary | ICD-10-CM

## 2016-04-08 HISTORY — DX: Other deformities of toe(s) (acquired), unspecified foot: M20.5X9

## 2016-04-24 ENCOUNTER — Ambulatory Visit: Payer: BC Managed Care – PPO | Admitting: Podiatry

## 2016-04-30 ENCOUNTER — Ambulatory Visit (INDEPENDENT_AMBULATORY_CARE_PROVIDER_SITE_OTHER): Payer: BC Managed Care – PPO | Admitting: Podiatry

## 2016-04-30 ENCOUNTER — Encounter: Payer: Self-pay | Admitting: Podiatry

## 2016-04-30 VITALS — BP 150/80 | HR 73 | Resp 14

## 2016-04-30 DIAGNOSIS — M2021 Hallux rigidus, right foot: Principal | ICD-10-CM

## 2016-04-30 DIAGNOSIS — M2040 Other hammer toe(s) (acquired), unspecified foot: Secondary | ICD-10-CM | POA: Diagnosis not present

## 2016-04-30 DIAGNOSIS — M205X1 Other deformities of toe(s) (acquired), right foot: Secondary | ICD-10-CM

## 2016-04-30 MED ORDER — MELOXICAM 15 MG PO TABS: 15.0000 mg | ORAL_TABLET | Freq: Every day | ORAL | 0 refills | Status: AC

## 2016-04-30 NOTE — Progress Notes (Signed)
   Subjective:    Patient ID: Tyler [name], male    DOB: 06-Apr-1951, 65 y.o.   MRN: [id number]  HPI this patient presents to the office with chief complaint of pain noted on the big toe of the right foot. He says there is sharp, radiating pain noted through the big toe of the right foot. He says he has lived with this bunion for years. He admits that he injured this as a teenager and had severe pain in his 60s and the pain is now returning in his 38s. He is concerned that the great toe is turning towards the second toe right foot. He also says he has a callus noted on the fourth toe which causes occasional pain and discomfort. He presents the office today for an evaluation and treatment of this condition    Review of Systems  All other systems reviewed and are negative.      Objective:   Physical Exam GENERAL APPEARANCE: Alert, conversant. Appropriately groomed. No acute distress.  VASCULAR: Pedal pulses are  palpable at  [facility] and PT bilateral.  Capillary refill time is immediate to all digits,  Normal temperature gradient.  Digital hair growth is present bilateral  NEUROLOGIC: sensation is normal to 5.07 monofilament at 5/5 sites bilateral.  Light touch is intact bilateral, Muscle strength normal.  MUSCULOSKELETAL: acceptable muscle strength, tone and stability bilateral.  Intrinsic muscluature intact bilateral. 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  DERMATOLOGIC: skin color, texture, and turgor are within normal limits.  No preulcerative lesions or ulcers  are seen, no interdigital maceration noted.  No open lesions present.  Digital nails are asymptomatic. No drainage noted. Callus  Fourth toe left foot.         Assessment & Plan:  Hallux limitus 1st MPJ  Right foot   IE  Prescribed Mobic.  To reschedule on Tuesday for x-rays.  Gardiner Barefoot DPM

## 2016-05-06 ENCOUNTER — Ambulatory Visit (INDEPENDENT_AMBULATORY_CARE_PROVIDER_SITE_OTHER): Payer: BC Managed Care – PPO | Admitting: Podiatry

## 2016-05-06 ENCOUNTER — Encounter: Payer: Self-pay | Admitting: Podiatry

## 2016-05-06 ENCOUNTER — Ambulatory Visit (INDEPENDENT_AMBULATORY_CARE_PROVIDER_SITE_OTHER): Payer: BC Managed Care – PPO

## 2016-05-06 VITALS — BP 128/77 | HR 71 | Resp 14

## 2016-05-06 DIAGNOSIS — R52 Pain, unspecified: Secondary | ICD-10-CM

## 2016-05-06 DIAGNOSIS — M2040 Other hammer toe(s) (acquired), unspecified foot: Secondary | ICD-10-CM | POA: Diagnosis not present

## 2016-05-06 DIAGNOSIS — M205X1 Other deformities of toe(s) (acquired), right foot: Secondary | ICD-10-CM | POA: Diagnosis not present

## 2016-05-06 DIAGNOSIS — M2021 Hallux rigidus, right foot: Secondary | ICD-10-CM

## 2016-05-06 NOTE — Progress Notes (Signed)
   Subjective:    Patient ID: Tyler [name], male    DOB: [birth year], 65 y.o.   MRN: [id number]  HPI this patient presents to the office for continued evaluation of his big toe joint right foot.  Patient was scheduled for an xray in the [place] office.  He has been taking Mobic and he says that has helped his movement and lessened his pain.  He presents for evaluation and treatment.    Review of Systems  All other systems reviewed and are negative.      Objective:   Physical Exam GENERAL APPEARANCE: Alert, conversant. Appropriately groomed. No acute distress.  VASCULAR: Pedal pulses are  palpable at  [facility] and PT bilateral.  Capillary refill time is immediate to all digits,  Normal temperature gradient.  Digital hair growth is present bilateral  NEUROLOGIC: sensation is normal to 5.07 monofilament at 5/5 sites bilateral.  Light touch is intact bilateral, Muscle strength normal.  MUSCULOSKELETAL: acceptable muscle strength, tone and stability bilateral.  Intrinsic muscluature intact bilateral. 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  DERMATOLOGIC: skin color, texture, and turgor are within normal limits.  No preulcerative lesions or ulcers  are seen, no interdigital maceration noted.  No open lesions present.  Digital nails are asymptomatic. No drainage noted. Callus  Fourth toe left foot.         Assessment & Plan:  Hallux limitus 1st MPJ  Right foot   ROV  Continue with Mobic as needed.  Xrays reveal severely damaged 1st MPJ  Right foot. Discussed conservative vs. Surgical treatment.  Conservative vs. Surgical treatment was discussed.  RTC prn.     Gardiner Barefoot DPM

## 2016-05-07 ENCOUNTER — Encounter: Payer: Self-pay | Admitting: Family Medicine

## 2016-07-04 ENCOUNTER — Encounter: Payer: Self-pay | Admitting: Family Medicine

## 2016-07-04 ENCOUNTER — Ambulatory Visit (INDEPENDENT_AMBULATORY_CARE_PROVIDER_SITE_OTHER): Payer: BC Managed Care – PPO | Admitting: Family Medicine

## 2016-07-04 VITALS — BP 123/73 | HR 72 | Temp 97.8°F | Resp 16 | Ht 71.0 in | Wt 223.4 lb

## 2016-07-04 DIAGNOSIS — Z23 Encounter for immunization: Secondary | ICD-10-CM | POA: Diagnosis not present

## 2016-07-04 DIAGNOSIS — Z0000 Encounter for general adult medical examination without abnormal findings: Secondary | ICD-10-CM | POA: Diagnosis not present

## 2016-07-04 DIAGNOSIS — E291 Testicular hypofunction: Secondary | ICD-10-CM

## 2016-07-04 DIAGNOSIS — R7301 Impaired fasting glucose: Secondary | ICD-10-CM | POA: Diagnosis not present

## 2016-07-04 DIAGNOSIS — Z125 Encounter for screening for malignant neoplasm of prostate: Secondary | ICD-10-CM

## 2016-07-04 DIAGNOSIS — Z1159 Encounter for screening for other viral diseases: Secondary | ICD-10-CM | POA: Diagnosis not present

## 2016-07-04 NOTE — Progress Notes (Signed)
Office Note 07/04/2016  CC:  Chief Complaint  Patient presents with  . Annual Exam    CPE    HPI:  Tyler [name] is a 65 y.o. White male who is here for annual health maintenance exam. Last testost inj was about 4 mo ago.  He is feeling fine, no lack of motivation or energy like he had before when first dx'd with hypogonadism.    No acute complaints. Exercise is minimal.     Past Medical History:  Diagnosis Date  . BPH (benign prostatic hypertrophy)   . Colon polyps 2013  . Fatigue    + excessive daytime somnolence  . GERD (gastroesophageal reflux disease)   . Hallux limitus 04/2016   1st MPJ joint R foot.  Diclofenac helpful.  . Hyperlipidemia   . Hypogonadism male   . IFG (impaired fasting glucose)    A1c 5.8% 2014    Past Surgical History:  Procedure Laterality Date  . COLONOSCOPY  1998 & 2013    Dr [name]; 2 benign polyps 2013  . hydrocoelectomy    . nocturnal polysomn  1990s   Sleep lab: no OSA (??)--Dr. [name] said he needs another sleep study as of 2013  . ORBITAL FRACTURE SURGERY    . TONSILLECTOMY AND ADENOIDECTOMY    . UPPER GI ENDOSCOPY  2000 & 2011   dilation 2000    Family History  Problem Relation Age of Onset  . Cancer Mother     Gynecologic  . COPD Mother   . Heart attack Father 79     CHF  . Heart attack Maternal Grandfather     > 55  . Heart attack Paternal Grandfather     >55  . Colon cancer Neg Hx   . Stomach cancer Neg Hx   . Diabetes Neg Hx   . Stroke Neg Hx     Social History   Social History  . Marital status: Married    Spouse name: N/A  . Number of children: 1  . Years of education: N/A   Occupational History  . Not on file.   Social History Main Topics  . Smoking status: Never Smoker  . Smokeless tobacco: Never Used  . Alcohol use 0.6 oz/week    1 Cans of beer per week     Comment:  2 beers/ month  . Drug use: No  . Sexual activity: Not on file   Other Topics Concern  . Not on file   Social  History Narrative   Married, 1 son.   Lives in [place].   Product/process development scientist.   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          Outpatient Medications Prior to Visit  Medication Sig Dispense Refill  . aspirin EC 81 MG tablet Take 1 tablet (81 mg total) by mouth daily. 30 tablet 0  . LYSINE HCL PO Take by mouth daily. Taking 2-3 x a week    . Multiple Vitamin (MULTIVITAMIN) tablet Take 1 tablet by mouth daily.    [name] omeprazole (PRILOSEC) 20 MG capsule Take 1 capsule (20 mg total) by mouth daily. 30 capsule 11  . pravastatin (PRAVACHOL) 40 MG tablet Take 1 tablet (40 mg total) by mouth daily. 90 tablet 3  . sildenafil (VIAGRA) 100 MG tablet Take 1 tablet (100 mg total) by mouth daily as needed for erectile dysfunction. (Patient not taking: Reported on 07/04/2016) 6 tablet 6  . testosterone cypionate (DEPOTESTOSTERONE CYPIONATE) 200 MG/ML injection Inject  1 mL (200 mg total) into the muscle every 14 (fourteen) days. (Patient not taking: Reported on 07/04/2016) 10 mL 1   No facility-administered medications prior to visit.     Allergies  Allergen Reactions  . Sulfonamide Derivatives     REACTION: rash Because of a history of documented adverse serious drug reaction;Medi Alert bracelet  is recommended    ROS Review of Systems  Constitutional: Negative for appetite change, chills, fatigue and fever.  HENT: Negative for congestion, dental problem, ear pain and sore throat.   Eyes: Negative for discharge, redness and visual disturbance.  Respiratory: Negative for cough, chest tightness, shortness of breath and wheezing.   Cardiovascular: Negative for chest pain, palpitations and leg swelling.  Gastrointestinal: Negative for abdominal pain, blood in stool, diarrhea, nausea and vomiting.  Genitourinary: Negative for difficulty urinating, dysuria, flank pain, frequency, hematuria and urgency.  Musculoskeletal: Negative for arthralgias, back pain, joint swelling, myalgias and neck  stiffness.  Skin: Negative for pallor and rash.  Neurological: Negative for dizziness, speech difficulty, weakness and headaches.  Hematological: Negative for adenopathy. Does not bruise/bleed easily.  Psychiatric/Behavioral: Negative for confusion and sleep disturbance. The patient is not nervous/anxious.     PE; Blood pressure 123/73, pulse 72, temperature 97.8 F (36.6 C), temperature source Temporal, resp. rate 16, height 5\' 11"  (1.803 m), weight 223 lb 6.4 oz (101.3 kg), SpO2 95 %. Gen: Alert, well appearing.  Patient is oriented to person, place, time, and situation. AFFECT: pleasant, lucid thought and speech. ENT: Ears: EACs clear, normal epithelium.  TMs with good light reflex and landmarks bilaterally.  Eyes: no injection, icteris, swelling, or exudate.  EOMI, PERRLA. Nose: no drainage or turbinate edema/swelling.  No injection or focal lesion.  Mouth: lips without lesion/swelling.  Oral mucosa pink and moist.  Dentition intact and without obvious caries or gingival swelling.  Oropharynx without erythema, exudate, or swelling.  Neck: supple/nontender.  No LAD, mass, or TM.  Carotid pulses 2+ bilaterally, without bruits. CV: RRR, no m/r/g.   LUNGS: CTA bilat, nonlabored resps, good aeration in all lung fields. ABD: soft, NT, ND, BS normal.  No hepatospenomegaly or mass.  No bruits. EXT: no clubbing, cyanosis, or edema.  Musculoskeletal: no joint swelling, erythema, warmth, or tenderness.  ROM of all joints intact. Skin - no sores or suspicious lesions or rashes or color changes Rectal exam: negative without mass, lesions or tenderness.  Prostate w/out enlargement, tenderness, induration, or nodularity.   Pertinent labs:  None today  ASSESSMENT AND PLAN:   Health maintenance exam: Reviewed age and gender appropriate health maintenance issues (prudent diet, regular exercise, health risks of tobacco and excessive alcohol, use of seatbelts, fire alarms in home, use of sunscreen).   Also reviewed age and gender appropriate health screening as well as vaccine recommendations. Flu vaccine today. HP labs today + Hba1c for hx of IFG. Prostate ca screening: DRE normal today, PSA drawn. Colon ca screening: Next colonoscopy due 2023. Hep C screening for pt today.  Male hypogonadism: he feels fine after NOT being on testost injections for the last 4 mo.  No testosterone level check today, no further meds at this time.  He'll let me know if symptoms of hypogonadism return.  An After Visit Summary was printed and given to the patient.  FOLLOW UP:  Return in about 1 year (around 07/04/2017) for annual CPE (fasting).  Signed:  Crissie Sickles, MD           07/04/2016

## 2016-07-04 NOTE — Progress Notes (Signed)
Pre visit review using our clinic review tool, if applicable. No additional management support is needed unless otherwise documented below in the visit note. 

## 2016-07-04 NOTE — Addendum Note (Signed)
Addended by: Ralph Dowdy on: 07/04/2016 09:08 AM   Modules accepted: Orders

## 2016-07-04 NOTE — Addendum Note (Signed)
Addended by: Gordy Councilman on: 07/04/2016 09:53 AM   Modules accepted: Orders

## 2016-07-09 ENCOUNTER — Other Ambulatory Visit (INDEPENDENT_AMBULATORY_CARE_PROVIDER_SITE_OTHER): Payer: BC Managed Care – PPO

## 2016-07-09 DIAGNOSIS — R7301 Impaired fasting glucose: Secondary | ICD-10-CM | POA: Diagnosis not present

## 2016-07-09 DIAGNOSIS — Z125 Encounter for screening for malignant neoplasm of prostate: Secondary | ICD-10-CM | POA: Diagnosis not present

## 2016-07-09 DIAGNOSIS — Z1159 Encounter for screening for other viral diseases: Secondary | ICD-10-CM

## 2016-07-09 DIAGNOSIS — Z0000 Encounter for general adult medical examination without abnormal findings: Secondary | ICD-10-CM | POA: Diagnosis not present

## 2016-07-09 LAB — COMPREHENSIVE METABOLIC PANEL
ALBUMIN: 4.3 g/dL (ref 3.5–5.2)
ALK PHOS: 76 U/L (ref 39–117)
ALT: 28 U/L (ref 0–53)
AST: 20 U/L (ref 0–37)
BUN: 15 mg/dL (ref 6–23)
CHLORIDE: 105 meq/L (ref 96–112)
CO2: 25 mEq/L (ref 19–32)
Calcium: 9.4 mg/dL (ref 8.4–10.5)
Creatinine, Ser: 0.97 mg/dL (ref 0.40–1.50)
GFR: 83.05 mL/min (ref >60.00)
Glucose, Bld: 95 mg/dL (ref 70–99)
POTASSIUM: 4.2 meq/L (ref 3.5–5.1)
Sodium: 139 mEq/L (ref 135–145)
TOTAL PROTEIN: 6.6 g/dL (ref 6.0–8.3)
Total Bilirubin: 0.6 mg/dL (ref 0.2–1.2)

## 2016-07-09 LAB — LIPID PANEL
Cholesterol: 184 mg/dL (ref 0–200)
HDL: 42.4 mg/dL
LDL Cholesterol: 108 mg/dL — ABNORMAL HIGH (ref 0–99)
NonHDL: 142.08
Total CHOL/HDL Ratio: 4
Triglycerides: 169 mg/dL — ABNORMAL HIGH (ref 0.0–149.0)
VLDL: 33.8 mg/dL (ref 0.0–40.0)

## 2016-07-09 LAB — TSH: TSH: 2.41 u[IU]/mL (ref 0.35–4.50)

## 2016-07-09 LAB — CBC WITH DIFFERENTIAL/PLATELET
Basophils Absolute: 0 K/uL (ref 0.0–0.1)
Basophils Relative: 0.6 % (ref 0.0–3.0)
Eosinophils Absolute: 0.2 K/uL (ref 0.0–0.7)
Eosinophils Relative: 3.1 % (ref 0.0–5.0)
HCT: 44.1 % (ref 39.0–52.0)
Hemoglobin: 14.9 g/dL (ref 13.0–17.0)
Lymphocytes Relative: 30.6 % (ref 12.0–46.0)
Lymphs Abs: 1.8 K/uL (ref 0.7–4.0)
MCHC: 33.8 g/dL (ref 30.0–36.0)
MCV: 92.9 fl (ref 78.0–100.0)
Monocytes Absolute: 0.6 K/uL (ref 0.1–1.0)
Monocytes Relative: 10.8 % (ref 3.0–12.0)
Neutro Abs: 3.2 K/uL (ref 1.4–7.7)
Neutrophils Relative %: 54.9 % (ref 43.0–77.0)
Platelets: 189 K/uL (ref 150.0–400.0)
RBC: 4.74 Mil/uL (ref 4.22–5.81)
RDW: 13.3 % (ref 11.5–15.5)
WBC: 5.8 K/uL (ref 4.0–10.5)

## 2016-07-09 LAB — HEMOGLOBIN A1C: Hgb A1c MFr Bld: 5.3 % (ref 4.6–6.5)

## 2016-07-09 LAB — PSA: PSA: 4.28 ng/mL — AB (ref 0.10–4.00)

## 2016-07-10 ENCOUNTER — Other Ambulatory Visit: Payer: Self-pay | Admitting: Family Medicine

## 2016-07-10 ENCOUNTER — Encounter: Payer: Self-pay | Admitting: Family Medicine

## 2016-07-10 DIAGNOSIS — R972 Elevated prostate specific antigen [PSA]: Secondary | ICD-10-CM

## 2016-07-10 HISTORY — DX: Elevated prostate specific antigen (PSA): R97.20

## 2016-07-10 LAB — HEPATITIS C ANTIBODY: HCV AB: NEGATIVE

## 2016-07-16 ENCOUNTER — Encounter: Payer: Self-pay | Admitting: Family Medicine

## 2016-08-04 ENCOUNTER — Encounter (INDEPENDENT_AMBULATORY_CARE_PROVIDER_SITE_OTHER): Payer: Self-pay | Admitting: Orthopaedic Surgery

## 2016-08-04 ENCOUNTER — Ambulatory Visit (INDEPENDENT_AMBULATORY_CARE_PROVIDER_SITE_OTHER): Payer: BC Managed Care – PPO | Admitting: Orthopaedic Surgery

## 2016-08-04 VITALS — BP 135/80 | HR 75 | Resp 14 | Ht 72.0 in | Wt 218.0 lb

## 2016-08-04 DIAGNOSIS — G8929 Other chronic pain: Secondary | ICD-10-CM | POA: Diagnosis not present

## 2016-08-04 DIAGNOSIS — M25561 Pain in right knee: Secondary | ICD-10-CM

## 2016-08-04 MED ORDER — BUPIVACAINE HCL 0.5 % IJ SOLN
3.0000 mL | INTRAMUSCULAR | Status: AC | PRN
  Administered 2016-08-04: 3 mL via INTRA_ARTICULAR

## 2016-08-04 MED ORDER — LIDOCAINE HCL 1 % IJ SOLN
5.0000 mL | INTRAMUSCULAR | Status: AC | PRN
  Administered 2016-08-04: 5 mL

## 2016-08-04 MED ORDER — METHYLPREDNISOLONE ACETATE 40 MG/ML IJ SUSP
80.0000 mg | INTRAMUSCULAR | Status: AC | PRN
  Administered 2016-08-04: 80 mg

## 2016-08-04 NOTE — Progress Notes (Signed)
Office Visit Note   Patient: Tyler [name]           Date of Birth: Jul 15, 1951           MRN: [id number] Visit Date: 08/04/2016              Requested by: Tammi Sou, MD [address] [place],  [address] PCP: Tammi Sou, MD   Assessment & Plan: Visit Diagnoses: No diagnosis found. 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 I think if he has a poor response in the future to cortisone is worth obtaining an MRI scan to determine if there is any other pathology  Plan:f/u prn  Follow-Up Instructions: No Follow-up on file.   Orders:  No orders of the defined types were placed in this encounter.  No orders of the defined types were placed in this encounter.     Procedures: Large Joint Inj Date/Time: 08/04/2016 4:10 PM Performed by: Garald Balding Authorized by: Garald Balding   Consent Given by:  Patient Timeout: prior to procedure the correct patient, procedure, and site was verified   Indications:  Pain and joint swelling Location:  Knee Site:  R knee Prep: patient was prepped and draped in usual sterile fashion   Needle Size:  25 G Needle Length:  1.5 inches Approach:  Anteromedial Ultrasound Guidance: No   Fluoroscopic Guidance: No   Arthrogram: No   Medications:  5 mL lidocaine 1 %; 80 mg methylPREDNISolone acetate 40 MG/ML; 3 mL bupivacaine 0.5 % Aspiration Attempted: No   Patient tolerance:  Patient tolerated the procedure well with no immediate complications     Clinical Data: No additional findings.   Subjective: No chief complaint on file.  Tyler [name] was seen by Aaron Edelman in February 2017 for evaluation of right knee pain. Films that demonstrated a slight decrease in the medial joint space. He's responded very nicely and long-term to a cortisone injection. Within the last month he has had some recurrence  of the pain to the point where he has been"limping "He denies any history of injury or trauma. The pain is localized mostly along the medial joint. Pt complains today of chronic Right knee pain. Was seen by BP 10/16/15 and had a cortisone injection at that time.  Today he has the same kind of pain and wants another injection.    Review of Systems   Objective: Vital Signs: There were no vitals taken for this visit.  Physical Exam  Ortho Exam exam of the right knee reveals a very small effusion. There is slight varus with weightbearing. He has full extension and flexes over 110 without evidence of instability. There was no popliteal or calf pain. He does have diffuse mostly anterior medial joint pain. There is no popping or clicking.  Specialty Comments:  No specialty comments available.  Imaging: No results found.   PMFS History: Patient Active Problem List   Diagnosis Date Noted  . Obstructive sleep apnea 08/10/2015  . Health maintenance examination 06/26/2014  . Hypogonadism male   . Chronic fatigue 04/30/2014  . Low serum testosterone level 06/23/2013  . Benign lymphoid polyp of colon 06/22/2013  . HYPERSOMNIA 11/08/2010  . Exercise induced bronchospasm 10/18/2010  . 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 ERECTILE DYSFUNCTION 08/18/2008  . Urinary frequency 11/22/2007  . HYPERLIPIDEMIA 10/25/2007  . GERD (gastroesophageal reflux disease)  10/25/2007   Past Medical History:  Diagnosis Date  . BPH (benign prostatic hypertrophy)   . Colon polyps 2013  . Elevated PSA 07/10/2016   Dr. Gaynelle Arabian saw him 07/15/16 and plans to do repeat PSA with %free and go from there; may need prost bx.  . Fatigue    + excessive daytime somnolence  . GERD (gastroesophageal reflux disease)   . Hallux limitus 04/2016   1st MPJ joint R foot.  Diclofenac helpful.  . Hyperlipidemia   . Hypogonadism male   . IFG (impaired fasting glucose)    A1c 5.8% 2014    Family History    Problem Relation Age of Onset  . Cancer Mother     Gynecologic  . COPD Mother   . Heart attack Father 23     CHF  . Heart attack Maternal Grandfather     > 55  . Heart attack Paternal Grandfather     >55  . Colon cancer Neg Hx   . Stomach cancer Neg Hx   . Diabetes Neg Hx   . Stroke Neg Hx     Past Surgical History:  Procedure Laterality Date  . COLONOSCOPY  1998 & 2013    Dr [name]; 2 benign polyps 2013  . hydrocoelectomy    . nocturnal polysomn  1990s   Sleep lab: no OSA (??)--Dr. [name] said he needs another sleep study as of 2013  . ORBITAL FRACTURE SURGERY    . TONSILLECTOMY AND ADENOIDECTOMY    . UPPER GI ENDOSCOPY  2000 & 2011   dilation 2000   Social History   Occupational History  . Not on file.   Social History Main Topics  . Smoking status: Never Smoker  . Smokeless tobacco: Never Used  . Alcohol use 0.6 oz/week    1 Cans of beer per week     Comment:  2 beers/ month  . Drug use: No  . Sexual activity: Not on file

## 2016-08-06 ENCOUNTER — Ambulatory Visit (INDEPENDENT_AMBULATORY_CARE_PROVIDER_SITE_OTHER): Payer: BC Managed Care – PPO | Admitting: Orthopedic Surgery

## 2016-12-02 ENCOUNTER — Ambulatory Visit (INDEPENDENT_AMBULATORY_CARE_PROVIDER_SITE_OTHER): Payer: BC Managed Care – PPO | Admitting: Podiatry

## 2016-12-02 ENCOUNTER — Encounter: Payer: Self-pay | Admitting: Podiatry

## 2016-12-02 DIAGNOSIS — M2021 Hallux rigidus, right foot: Principal | ICD-10-CM

## 2016-12-02 DIAGNOSIS — M205X1 Other deformities of toe(s) (acquired), right foot: Secondary | ICD-10-CM

## 2016-12-02 NOTE — Progress Notes (Signed)
This patient presents to the office with chief complaint of continued pain noted through the big toe joint, right foot. He was seen previously at [place] and x-rays were taken at [place]. It would be-year-old presence of a hallux limitus first MPJ right foot. Due to arthritic changes. He says he was treated with Mobic which helps but the pain has worsened and return. He presents the office today for continued evaluation and treatment of his hallux limitus condition big toe, right foot  GENERAL APPEARANCE: Alert, conversant. Appropriately groomed. No acute distress.  VASCULAR: Pedal pulses are  palpable at  [facility] and PT bilateral.  Capillary refill time is immediate to all digits,  Normal temperature gradient.  Digital hair growth is present bilateral  NEUROLOGIC: sensation is normal to 5.07 monofilament at 5/5 sites bilateral.  Light touch is intact bilateral, Muscle strength normal.  MUSCULOSKELETAL: acceptable muscle strength, tone and stability bilateral.  Intrinsic muscluature intact bilateral.  Hallux limitus 1st MPJ right foot. Hammer toe second  B/L.  DERMATOLOGIC: skin color, texture, and turgor are within normal limits.  No preulcerative lesions or ulcers  are seen, no interdigital maceration noted.  No open lesions present.  Digital nails are asymptomatic. No drainage noted.                                               Hallux limitus 1st MPJ  Right foot.  ROV  Injection therapy right foot.  Discussed conservative vs. Surgical correction of this condition. RTC prn   Gardiner Barefoot DPM

## 2017-02-16 ENCOUNTER — Encounter (INDEPENDENT_AMBULATORY_CARE_PROVIDER_SITE_OTHER): Payer: Self-pay | Admitting: Orthopaedic Surgery

## 2017-02-16 ENCOUNTER — Ambulatory Visit (INDEPENDENT_AMBULATORY_CARE_PROVIDER_SITE_OTHER): Payer: BC Managed Care – PPO | Admitting: Orthopaedic Surgery

## 2017-02-16 ENCOUNTER — Ambulatory Visit (INDEPENDENT_AMBULATORY_CARE_PROVIDER_SITE_OTHER): Payer: BC Managed Care – PPO

## 2017-02-16 VITALS — BP 110/66 | HR 62 | Ht 72.0 in | Wt 218.0 lb

## 2017-02-16 DIAGNOSIS — M25562 Pain in left knee: Secondary | ICD-10-CM

## 2017-02-16 DIAGNOSIS — G8929 Other chronic pain: Secondary | ICD-10-CM | POA: Diagnosis not present

## 2017-02-16 MED ORDER — BUPIVACAINE HCL 0.5 % IJ SOLN
3.0000 mL | INTRAMUSCULAR | Status: AC | PRN
  Administered 2017-02-16: 3 mL via INTRA_ARTICULAR

## 2017-02-16 MED ORDER — METHYLPREDNISOLONE ACETATE 40 MG/ML IJ SUSP
80.0000 mg | INTRAMUSCULAR | Status: AC | PRN
  Administered 2017-02-16: 80 mg

## 2017-02-16 MED ORDER — LIDOCAINE HCL 1 % IJ SOLN
5.0000 mL | INTRAMUSCULAR | Status: AC | PRN
  Administered 2017-02-16: 5 mL

## 2017-02-16 NOTE — Progress Notes (Signed)
Office Visit Note   Patient: Tyler [name]           Date of Birth: 1951-05-18           MRN: [id number] Visit Date: 02/16/2017              Requested by: Tammi Sou, MD [id number] [place], [place] [address] PCP: Tammi Sou, MD   Assessment & Plan: Visit Diagnoses:  1. Chronic pain of left knee   Symptoms probably consistent with osteoarthritis  Plan: Cortisone injection left knee monitor her response. Consider MRI scan if no improvement over the next 4-6 weeks  Follow-Up Instructions: Return if symptoms worsen or fail to improve.   Orders:  Orders Placed This Encounter  Procedures  . XR KNEE 3 VIEW LEFT   No orders of the defined types were placed in this encounter.     Procedures: Large Joint Inj Date/Time: 02/16/2017 11:53 AM Performed by: Garald Balding Authorized by: Garald Balding   Consent Given by:  Patient Timeout: prior to procedure the correct patient, procedure, and site was verified   Indications:  Pain and joint swelling Location:  Knee Site:  L knee Prep: patient was prepped and draped in usual sterile fashion   Needle Size:  25 G Needle Length:  1.5 inches Approach:  Anteromedial Ultrasound Guidance: No   Fluoroscopic Guidance: No   Arthrogram: No   Medications:  5 mL lidocaine 1 %; 80 mg methylPREDNISolone acetate 40 MG/ML; 3 mL bupivacaine 0.5 % Aspiration Attempted: No   Patient tolerance:  Patient tolerated the procedure well with no immediate complications     Clinical Data: No additional findings.   Subjective: Chief Complaint  Patient presents with  . Left Knee - Pain    Tyler [name] is a 66 y o that presents with Left knee pain. He relates 2 . 5 weeks ago he was in a hotel room and twistedin the bed and he heard something "pop" in his L Knee.  Tyler [name] was in [place] several weeks ago. While staying at a motel he apparently got his left knee twisted in the sheets. Developed  pain along the medial aspect of his left knee to the point where it is uncomfortable and compromising. He has not had any locking. Or obvious effusion  HPI  Review of Systems   Objective: Vital Signs: BP 110/66   Pulse 62   Ht 6' (1.829 m)   Wt 218 lb (98.9 kg)   BMI 29.57 kg/m   Physical Exam  Ortho Exam left knee with mild yet diffuse medial joint discomfort. Mild patella crepitation. No swelling distally. Neurovascular exam intact. No popping or clicking. No instability. Full extension and flexion over 105. No effusion  Specialty Comments:  No specialty comments available.  Imaging: No results found.   PMFS History: Patient Active Problem List   Diagnosis Date Noted  . Obstructive sleep apnea 08/10/2015  . Health maintenance examination 06/26/2014  . Hypogonadism male   . Chronic fatigue 04/30/2014  . Low serum testosterone level 06/23/2013  . Benign lymphoid polyp of colon 06/22/2013  . HYPERSOMNIA 11/08/2010  . Exercise induced bronchospasm 10/18/2010  . 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 ERECTILE DYSFUNCTION 08/18/2008  . Urinary frequency 11/22/2007  . HYPERLIPIDEMIA 10/25/2007  . GERD (gastroesophageal reflux disease) 10/25/2007   Past Medical History:  Diagnosis Date  . BPH (benign prostatic hypertrophy)   . Chronic pain of  right knee    Dr. Durward Fortes  . Colon polyps 2013  . Elevated PSA 07/10/2016   Dr. Gaynelle Arabian saw him 07/15/16 and plans to do repeat PSA with %free and go from there; may need prost bx.  . Fatigue    + excessive daytime somnolence  . GERD (gastroesophageal reflux disease)   . Hallux limitus 04/2016   1st MPJ joint R foot.  Diclofenac helpful.  Injected by podiatrist 11/2016.  [name] Hyperlipidemia   . Hypogonadism male   . IFG (impaired fasting glucose)    A1c 5.8% 2014    Family History  Problem Relation Age of Onset  . Cancer Mother        Gynecologic  . COPD Mother   . Heart attack Father 4        CHF  .  Heart attack Maternal Grandfather        > 55  . Heart attack Paternal Grandfather        >55  . Colon cancer Neg Hx   . Stomach cancer Neg Hx   . Diabetes Neg Hx   . Stroke Neg Hx     Past Surgical History:  Procedure Laterality Date  . COLONOSCOPY  1998 & 2013    Dr [name]; 2 benign polyps 2013  . hydrocoelectomy    . nocturnal polysomn  1990s   Sleep lab: no OSA (??)--Dr. [name] said he needs another sleep study as of 2013  . ORBITAL FRACTURE SURGERY    . TONSILLECTOMY AND ADENOIDECTOMY    . UPPER GI ENDOSCOPY  2000 & 2011   dilation 2000   Social History   Occupational History  . Not on file.   Social History Main Topics  . Smoking status: Never Smoker  . Smokeless tobacco: Never Used  . Alcohol use 0.6 oz/week    1 Cans of beer per week     Comment:  2 beers/ month  . Drug use: No  . Sexual activity: Not on file     Garald Balding, MD   Note - This record has been created using [employer].  Chart creation errors have been sought, but may not always  have been located. Such creation errors do not reflect on  the standard of medical care.

## 2017-02-26 ENCOUNTER — Encounter: Payer: Self-pay | Admitting: Family Medicine

## 2017-03-10 ENCOUNTER — Other Ambulatory Visit: Payer: Self-pay

## 2017-03-10 DIAGNOSIS — E782 Mixed hyperlipidemia: Secondary | ICD-10-CM

## 2017-03-10 MED ORDER — PRAVASTATIN SODIUM 40 MG PO TABS: 40.0000 mg | ORAL_TABLET | Freq: Every day | ORAL | 0 refills | Status: DC

## 2017-03-10 NOTE — Telephone Encounter (Signed)
Refill sent for pravastatin.

## 2017-06-13 ENCOUNTER — Other Ambulatory Visit: Payer: Self-pay | Admitting: Family Medicine

## 2017-06-13 DIAGNOSIS — E782 Mixed hyperlipidemia: Secondary | ICD-10-CM

## 2017-06-25 ENCOUNTER — Ambulatory Visit (INDEPENDENT_AMBULATORY_CARE_PROVIDER_SITE_OTHER): Payer: BC Managed Care – PPO

## 2017-06-25 DIAGNOSIS — Z23 Encounter for immunization: Secondary | ICD-10-CM

## 2017-07-08 ENCOUNTER — Ambulatory Visit (INDEPENDENT_AMBULATORY_CARE_PROVIDER_SITE_OTHER): Payer: BC Managed Care – PPO | Admitting: Family Medicine

## 2017-07-08 ENCOUNTER — Telehealth: Payer: Self-pay | Admitting: Family Medicine

## 2017-07-08 ENCOUNTER — Encounter: Payer: Self-pay | Admitting: Family Medicine

## 2017-07-08 VITALS — BP 113/70 | HR 65 | Temp 98.0°F | Resp 16 | Ht 72.0 in | Wt 224.8 lb

## 2017-07-08 DIAGNOSIS — Z0000 Encounter for general adult medical examination without abnormal findings: Secondary | ICD-10-CM | POA: Diagnosis not present

## 2017-07-08 DIAGNOSIS — R972 Elevated prostate specific antigen [PSA]: Secondary | ICD-10-CM | POA: Diagnosis not present

## 2017-07-08 DIAGNOSIS — E7800 Pure hypercholesterolemia, unspecified: Secondary | ICD-10-CM | POA: Diagnosis not present

## 2017-07-08 LAB — CBC WITH DIFFERENTIAL/PLATELET
Basophils Absolute: 0 10*3/uL (ref 0.0–0.1)
Basophils Relative: 0.5 % (ref 0.0–3.0)
EOS PCT: 3.9 % (ref 0.0–5.0)
Eosinophils Absolute: 0.2 10*3/uL (ref 0.0–0.7)
HCT: 44.8 % (ref 39.0–52.0)
Hemoglobin: 14.8 g/dL (ref 13.0–17.0)
LYMPHS ABS: 1.9 10*3/uL (ref 0.7–4.0)
Lymphocytes Relative: 33.1 % (ref 12.0–46.0)
MCHC: 33 g/dL (ref 30.0–36.0)
MCV: 94.3 fl (ref 78.0–100.0)
MONOS PCT: 7.9 % (ref 3.0–12.0)
Monocytes Absolute: 0.5 10*3/uL (ref 0.1–1.0)
NEUTROS PCT: 54.6 % (ref 43.0–77.0)
Neutro Abs: 3.1 10*3/uL (ref 1.4–7.7)
Platelets: 234 10*3/uL (ref 150.0–400.0)
RBC: 4.75 Mil/uL (ref 4.22–5.81)
RDW: 13 % (ref 11.5–15.5)
WBC: 5.7 10*3/uL (ref 4.0–10.5)

## 2017-07-08 LAB — COMPREHENSIVE METABOLIC PANEL
ALT: 20 U/L (ref 0–53)
AST: 15 U/L (ref 0–37)
Albumin: 4.2 g/dL (ref 3.5–5.2)
Alkaline Phosphatase: 83 U/L (ref 39–117)
BUN: 17 mg/dL (ref 6–23)
CHLORIDE: 106 meq/L (ref 96–112)
CO2: 27 mEq/L (ref 19–32)
Calcium: 9.6 mg/dL (ref 8.4–10.5)
Creatinine, Ser: 0.96 mg/dL (ref 0.40–1.50)
GFR: 83.78 mL/min (ref >60.00)
GLUCOSE: 99 mg/dL (ref 70–99)
POTASSIUM: 4.5 meq/L (ref 3.5–5.1)
SODIUM: 140 meq/L (ref 135–145)
TOTAL PROTEIN: 6.8 g/dL (ref 6.0–8.3)
Total Bilirubin: 0.5 mg/dL (ref 0.2–1.2)

## 2017-07-08 LAB — LIPID PANEL
CHOL/HDL RATIO: 5
Cholesterol: 206 mg/dL — ABNORMAL HIGH (ref 0–200)
HDL: 45.4 mg/dL (ref >39.00)
LDL CALC: 124 mg/dL — AB (ref 0–99)
NONHDL: 160.68
Triglycerides: 181 mg/dL — ABNORMAL HIGH (ref 0.0–149.0)
VLDL: 36.2 mg/dL (ref 0.0–40.0)

## 2017-07-08 NOTE — Addendum Note (Signed)
Addended by: Tammi Sou on: 07/08/2017 11:37 AM   Modules accepted: Orders

## 2017-07-08 NOTE — Telephone Encounter (Signed)
Please advise. Thanks.  

## 2017-07-08 NOTE — Progress Notes (Addendum)
Office Note 07/08/2017  CC:  Chief Complaint  Patient presents with  . Annual Exam    Pt is fasting.     HPI:  Tyler [name] is a 67 y.o. male who is here for annual health maintenance exam.  Eyes: exam UTD. Dental: UTD exams. Exercise: very little now.  Walks some occasionally.  Yard work. Diet: Not working on anything in particular excepts avoids soft drinks, avoids simple sugars the best he can.   Past Medical History:  Diagnosis Date  . BPH (benign prostatic hypertrophy)   . Chronic pain of right knee    Left knee as well (osteoarthritis).  Dr. Durward Fortes  . Colon polyps 2013   NON-adenomatous 2013---recall 10 yrs.  . Elevated PSA 07/10/2016   Dr. Gaynelle Arabian saw him 07/15/16 and plans to do repeat PSA with %free and go from there; may need prost bx.  . Fatigue    + excessive daytime somnolence  . GERD (gastroesophageal reflux disease)   . Hallux limitus 04/2016   1st MPJ joint R foot.  Diclofenac helpful.  Injected by podiatrist 11/2016.  [name] Hyperlipidemia   . Hypogonadism male   . IFG (impaired fasting glucose)    A1c 5.8% 2014    Past Surgical History:  Procedure Laterality Date  . COLONOSCOPY  1998 & 2013    Dr [name]; 2 benign polyps 2013--recall 2023.  . hydrocoelectomy    . nocturnal polysomn  1990s   Sleep lab: no OSA (??)--Dr. [name] said he needs another sleep study as of 2013  . ORBITAL FRACTURE SURGERY    . TONSILLECTOMY AND ADENOIDECTOMY    . UPPER GI ENDOSCOPY  2000 & 2011   dilation 2000    Family History  Problem Relation Age of Onset  . Cancer Mother        Gynecologic  . COPD Mother   . Heart attack Father 73        CHF  . Heart attack Maternal Grandfather        > 55  . Heart attack Paternal Grandfather        >55  . Colon cancer Neg Hx   . Stomach cancer Neg Hx   . Diabetes Neg Hx   . Stroke Neg Hx     Social History   Social History  . Marital status: Married    Spouse name: N/A  . Number of children: 1  . Years  of education: N/A   Occupational History  . Not on file.   Social History Main Topics  . Smoking status: Never Smoker  . Smokeless tobacco: Never Used  . Alcohol use 0.6 oz/week    1 Cans of beer per week     Comment:  2 beers/ month  . Drug use: No  . Sexual activity: Not on file   Other Topics Concern  . Not on file   Social History Narrative   Married, 1 son.   Lives in [place].   Product/process development scientist.   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          Outpatient Medications Prior to Visit  Medication Sig Dispense Refill  . aspirin EC 81 MG tablet Take 1 tablet (81 mg total) by mouth daily. 30 tablet 0  . LYSINE HCL PO Take by mouth daily. Taking 2-3 x a week    . Multiple Vitamin (MULTIVITAMIN) tablet Take 1 tablet by mouth daily.    [name] omeprazole (PRILOSEC) 20 MG capsule Take 1  capsule (20 mg total) by mouth daily. 30 capsule 11  . pravastatin (PRAVACHOL) 40 MG tablet TAKE 1 TABLET BY MOUTH EVERY DAY 90 tablet 0  . meloxicam (MOBIC) 15 MG tablet     . sildenafil (VIAGRA) 100 MG tablet Take 1 tablet (100 mg total) by mouth daily as needed for erectile dysfunction. (Patient not taking: Reported on 07/08/2017) 6 tablet 6  . testosterone cypionate (DEPOTESTOSTERONE CYPIONATE) 200 MG/ML injection Inject 1 mL (200 mg total) into the muscle every 14 (fourteen) days. (Patient not taking: Reported on 02/16/2017) 10 mL 1   No facility-administered medications prior to visit.     Allergies  Allergen Reactions  . Sulfonamide Derivatives     REACTION: rash Because of a history of documented adverse serious drug reaction;Medi Alert bracelet  is recommended    ROS Review of Systems  Constitutional: Negative for appetite change, chills, fatigue and fever.  HENT: Negative for congestion, dental problem, ear pain and sore throat.   Eyes: Negative for discharge, redness and visual disturbance.  Respiratory: Negative for cough, chest tightness, shortness of breath and wheezing.    Cardiovascular: Negative for chest pain, palpitations and leg swelling.  Gastrointestinal: Negative for abdominal pain, blood in stool, diarrhea, nausea and vomiting.  Genitourinary: Negative for difficulty urinating, dysuria, flank pain, frequency, hematuria and urgency.  Musculoskeletal: Negative for arthralgias, back pain, joint swelling, myalgias and neck stiffness.  Skin: Negative for pallor and rash.  Neurological: Negative for dizziness, speech difficulty, weakness and headaches.  Hematological: Negative for adenopathy. Does not bruise/bleed easily.  Psychiatric/Behavioral: Negative for confusion and sleep disturbance. The patient is not nervous/anxious.     PE; Blood pressure 113/70, pulse 65, temperature 98 F (36.7 C), temperature source Oral, resp. rate 16, height 6' (1.829 m), weight 224 lb 12 oz (101.9 kg), SpO2 95 %. Gen: Alert, well appearing.  Patient is oriented to person, place, time, and situation. AFFECT: pleasant, lucid thought and speech. ENT: Ears: EACs clear, normal epithelium.  TMs with good light reflex and landmarks bilaterally.  Eyes: no injection, icteris, swelling, or exudate.  EOMI, PERRLA. Nose: no drainage or turbinate edema/swelling.  No injection or focal lesion.  Mouth: lips without lesion/swelling.  Oral mucosa pink and moist.  Dentition intact and without obvious caries or gingival swelling.  Oropharynx without erythema, exudate, or swelling.  Neck: supple/nontender.  No LAD, mass, or TM.  Carotid pulses 2+ bilaterally, without bruits. CV: RRR, no m/r/g.   LUNGS: CTA bilat, nonlabored resps, good aeration in all lung fields. ABD: soft, NT, ND, BS normal.  No hepatospenomegaly or mass.  No bruits. EXT: no clubbing, cyanosis, or edema.  Musculoskeletal: no joint swelling, erythema, warmth, or tenderness.  ROM of all joints intact. Skin - no sores or suspicious lesions or rashes or color changes Rectal: deferred (he'll be making urology f/u)  Pertinent  labs:  Lab Results  Component Value Date   TSH 2.41 07/09/2016   Lab Results  Component Value Date   WBC 5.8 07/09/2016   HGB 14.9 07/09/2016   HCT 44.1 07/09/2016   MCV 92.9 07/09/2016   PLT 189.0 07/09/2016   Lab Results  Component Value Date   CREATININE 0.97 07/09/2016   BUN 15 07/09/2016   NA 139 07/09/2016   K 4.2 07/09/2016   CL 105 07/09/2016   CO2 25 07/09/2016   Lab Results  Component Value Date   ALT 28 07/09/2016   AST 20 07/09/2016   ALKPHOS 76 07/09/2016  BILITOT 0.6 07/09/2016   Lab Results  Component Value Date   CHOL 184 07/09/2016   Lab Results  Component Value Date   HDL 42.40 07/09/2016   Lab Results  Component Value Date   LDLCALC 108 (H) 07/09/2016   Lab Results  Component Value Date   TRIG 169.0 (H) 07/09/2016   Lab Results  Component Value Date   CHOLHDL 4 07/09/2016   Lab Results  Component Value Date   PSA 4.28 (H) 07/09/2016   PSA 2.95 06/20/2015   PSA 2.70 06/19/2014   Lab Results  Component Value Date   HGBA1C 5.3 07/09/2016   Lab Results  Component Value Date   TESTOSTERONE 352.85 06/20/2015    ASSESSMENT AND PLAN:   Health maintenance exam: Reviewed age and gender appropriate health maintenance issues (prudent diet, regular exercise, health risks of tobacco and excessive alcohol, use of seatbelts, fire alarms in home, use of sunscreen).  Also reviewed age and gender appropriate health screening as well as vaccine recommendations. Vaccines: Tdap UTD.  Flu vaccine--he got this here last week.  Shingrix discussed. Labs: CMET, CBC, FLP Prostate ca screening: hx of mild elevation of PSA-being followed by urology.  Pt to make f/u appt for this with Dr. Gaynelle Arabian. Colon ca screening: next colonoscopy 2023.  An After Visit Summary was printed and given to the patient.  FOLLOW UP:  Return in about 6 months (around 01/05/2018) for routine chronic illness f/u (fasting).  Signed:  Crissie Sickles, MD            07/08/2017  ADDENDUM 11:35, 07/08/17---pt called back and asked me to add on the PSA test. I did this, and if still elevated then he will be instructed to return to Dr. Gaynelle Arabian, as per the original plan.

## 2017-07-08 NOTE — Patient Instructions (Signed)

## 2017-07-08 NOTE — Telephone Encounter (Signed)
Patient states during ov today, pcp inquired about PSA level being checked.  Patient states he forgot that he did have his PSA checked by [facility] last year and it was elevated.  He would like to know if PSA can be added to the lab work he had done this morning while in the office.

## 2017-07-13 ENCOUNTER — Other Ambulatory Visit: Payer: Self-pay | Admitting: *Deleted

## 2017-07-13 DIAGNOSIS — R972 Elevated prostate specific antigen [PSA]: Secondary | ICD-10-CM

## 2017-07-16 ENCOUNTER — Other Ambulatory Visit (INDEPENDENT_AMBULATORY_CARE_PROVIDER_SITE_OTHER): Payer: BC Managed Care – PPO

## 2017-07-16 DIAGNOSIS — R972 Elevated prostate specific antigen [PSA]: Secondary | ICD-10-CM | POA: Diagnosis not present

## 2017-07-20 ENCOUNTER — Encounter: Payer: Self-pay | Admitting: Family Medicine

## 2017-07-20 ENCOUNTER — Telehealth: Payer: Self-pay | Admitting: Family Medicine

## 2017-07-20 LAB — PSA, TOTAL AND FREE
PSA, % FREE: 19 % — AB (ref >25)
PSA, FREE: 0.6 ng/mL
PSA, TOTAL: 3.1 ng/mL (ref <=4.0)

## 2017-07-20 NOTE — Telephone Encounter (Signed)
Pls request records from Dr. Gaynelle Arabian at [facility] urology: I just need the patient's PSA result from when he was last seen there 07/2016.--thx.

## 2017-07-22 ENCOUNTER — Encounter: Payer: Self-pay | Admitting: Family Medicine

## 2017-08-04 ENCOUNTER — Encounter: Payer: Self-pay | Admitting: Family Medicine

## 2017-11-09 ENCOUNTER — Encounter: Payer: Self-pay | Admitting: Family Medicine

## 2017-12-09 ENCOUNTER — Ambulatory Visit: Payer: BC Managed Care – PPO | Admitting: Podiatry

## 2017-12-09 ENCOUNTER — Encounter: Payer: Self-pay | Admitting: Podiatry

## 2017-12-09 DIAGNOSIS — M2021 Hallux rigidus, right foot: Secondary | ICD-10-CM

## 2017-12-09 DIAGNOSIS — Q828 Other specified congenital malformations of skin: Secondary | ICD-10-CM

## 2017-12-09 DIAGNOSIS — M205X1 Other deformities of toe(s) (acquired), right foot: Secondary | ICD-10-CM

## 2017-12-09 NOTE — Progress Notes (Signed)
This patient presents the office with chief complaint of painful callus on the bottom of both feet.  Tyler [name] He says the callus on his right foot is more painful than his left foot.  Patient has previously been diagnosed with a hallux limitus first MPJ right foot.   He says when he walks he puts weight on the outside ball of his right foot.  He believes this leads to the callus formation that becomes painful.  He presents the e today for ation and treat of his feet.    General Appearance  Alert, conversant and in no acute stress.  Vascular  Dorsalis pedis and posterior tibial  pulses are palpable  bilaterally.  Capillary return is within normal limits  bilaterally. Temperature is within normal limits  bilaterally.  Neurologic  Senn-Weinstein monofilament wire test within normal limits  bilaterally. Muscle power within normal limits bilaterally.  Nails . Thickened unattached nail plate on the left hallux.  No evidence of any redness, swelling or drainage from the nail site.  Orthopedic  No limitations of motion of motion feet .  No crepitus or effusions noted.  No bony pathology or digital deformities noted. . Hallux limitus first MPJ bilaterally.  Hammertoe second bilaterally  Skin  normotropic skin with  noted bilaterally.  No signs of infections or ulcers noted.  Porokeratosis  Sub 5th met  B/L.  Porokeratosis  B/L  ROV  Debridement of porokeratosis  B/L.  RTC prn   Gardiner Barefoot DPM

## 2018-04-13 ENCOUNTER — Other Ambulatory Visit: Payer: Self-pay | Admitting: Family Medicine

## 2018-04-13 DIAGNOSIS — E782 Mixed hyperlipidemia: Secondary | ICD-10-CM

## 2018-06-22 ENCOUNTER — Ambulatory Visit (INDEPENDENT_AMBULATORY_CARE_PROVIDER_SITE_OTHER): Payer: Medicare HMO

## 2018-06-22 DIAGNOSIS — Z23 Encounter for immunization: Secondary | ICD-10-CM | POA: Diagnosis not present

## 2018-07-14 ENCOUNTER — Encounter: Payer: Self-pay | Admitting: Family Medicine

## 2018-07-14 ENCOUNTER — Ambulatory Visit (INDEPENDENT_AMBULATORY_CARE_PROVIDER_SITE_OTHER): Payer: Medicare HMO | Admitting: Family Medicine

## 2018-07-14 VITALS — BP 125/75 | HR 65 | Temp 97.7°F | Resp 16 | Ht 70.5 in | Wt 228.1 lb

## 2018-07-14 DIAGNOSIS — Z125 Encounter for screening for malignant neoplasm of prostate: Secondary | ICD-10-CM | POA: Diagnosis not present

## 2018-07-14 DIAGNOSIS — Z0000 Encounter for general adult medical examination without abnormal findings: Secondary | ICD-10-CM | POA: Diagnosis not present

## 2018-07-14 DIAGNOSIS — E7800 Pure hypercholesterolemia, unspecified: Secondary | ICD-10-CM

## 2018-07-14 DIAGNOSIS — Z23 Encounter for immunization: Secondary | ICD-10-CM | POA: Diagnosis not present

## 2018-07-14 DIAGNOSIS — E669 Obesity, unspecified: Secondary | ICD-10-CM

## 2018-07-14 LAB — COMPREHENSIVE METABOLIC PANEL
ALBUMIN: 4.4 g/dL (ref 3.5–5.2)
ALT: 22 U/L (ref 0–53)
AST: 18 U/L (ref 0–37)
Alkaline Phosphatase: 90 U/L (ref 39–117)
BUN: 15 mg/dL (ref 6–23)
CALCIUM: 9.5 mg/dL (ref 8.4–10.5)
CO2: 27 mEq/L (ref 19–32)
Chloride: 105 mEq/L (ref 96–112)
Creatinine, Ser: 0.91 mg/dL (ref 0.40–1.50)
GFR: 88.83 mL/min (ref >60.00)
Glucose, Bld: 96 mg/dL (ref 70–99)
POTASSIUM: 4.4 meq/L (ref 3.5–5.1)
SODIUM: 139 meq/L (ref 135–145)
TOTAL PROTEIN: 6.8 g/dL (ref 6.0–8.3)
Total Bilirubin: 0.5 mg/dL (ref 0.2–1.2)

## 2018-07-14 LAB — LDL CHOLESTEROL, DIRECT: Direct LDL: 116 mg/dL

## 2018-07-14 LAB — LIPID PANEL
CHOL/HDL RATIO: 5
Cholesterol: 195 mg/dL (ref 0–200)
HDL: 42.5 mg/dL (ref >39.00)
NonHDL: 152.31
Triglycerides: 278 mg/dL — ABNORMAL HIGH (ref 0.0–149.0)
VLDL: 55.6 mg/dL — AB (ref 0.0–40.0)

## 2018-07-14 LAB — PSA, MEDICARE: PSA: 3.21 ng/mL (ref 0.10–4.00)

## 2018-07-14 MED ORDER — ZOSTER VAC RECOMB ADJUVANTED 50 MCG/0.5ML IM SUSR: 0.5000 mL | Freq: Once | INTRAMUSCULAR | 1 refills | Status: AC

## 2018-07-14 NOTE — Progress Notes (Signed)
Office Note 07/14/2018  CC:  Chief Complaint  Patient presents with  . Annual Exam    Pt is fasting.     HPI:  Tyler [name] is a 67 y.o. male who is here for annual health maintenance exam.  Feeling well.  Exercise: occ goes to [employer], treadmill, stepper, light wts. Some yardwork. Diet: not working on anything in particular, but wants to lose about 35 lbs.   Past Medical History:  Diagnosis Date  . BPH (benign prostatic hypertrophy)    Flomax helpful 2018/19  . Chronic pain of right knee    Left knee as well (osteoarthritis).  Dr. Durward Fortes  . Colon polyps 2013   NON-adenomatous 2013---recall 10 yrs.  . Elevated PSA 07/10/2016   Dr. Gaynelle Arabian saw him 07/15/16, did f/u PSA and it was 2.78: f/u was recommended but pt did not do this as of 07/2017.  Repeat PSA here 07/2017 was 3.1, with 19% free (free a little low).  Pt then followed up with Dr. Gaynelle Arabian and tx for BPH w/plan of repeat PSA 1 yr.  . Erectile dysfunction    Urol rx'd sildenafil 20mg  tabs 11/2017.  [name] Fatigue    + excessive daytime somnolence  . GERD (gastroesophageal reflux disease)   . Hallux limitus 04/2016   1st MPJ joint R foot.  Diclofenac helpful.  Injected by podiatrist 11/2016.  [name] Hyperlipidemia   . Hypogonadism male   . IFG (impaired fasting glucose)    A1c 5.8% 2014    Past Surgical History:  Procedure Laterality Date  . COLONOSCOPY  1998 & 2013    Dr [name]; 2 benign polyps 2013--recall 2023.  . hydrocoelectomy    . nocturnal polysomn  1990s   Sleep lab: no OSA (??)--Dr. [name] said he needs another sleep study as of 2013  . ORBITAL FRACTURE SURGERY    . TONSILLECTOMY AND ADENOIDECTOMY    . UPPER GI ENDOSCOPY  2000 & 2011   dilation 2000    Family History  Problem Relation Age of Onset  . Cancer Mother        Gynecologic  . COPD Mother   . Heart attack Father 24        CHF  . Heart attack Maternal Grandfather        > 55  . Heart attack Paternal Grandfather    >55  . Colon cancer Neg Hx   . Stomach cancer Neg Hx   . Diabetes Neg Hx   . Stroke Neg Hx     Social History   Socioeconomic History  . Marital status: Married    Spouse name: Not on file  . Number of children: 1  . Years of education: Not on file  . Highest education level: Not on file  Occupational History  . Not on file  Social Needs  . Financial resource strain: Not on file  . Food insecurity:    Worry: Not on file    Inability: Not on file  . Transportation needs:    Medical: Not on file    Non-medical: Not on file  Tobacco Use  . Smoking status: Never Smoker  . Smokeless tobacco: Never Used  Substance and Sexual Activity  . Alcohol use: Yes    Alcohol/week: 1.0 standard drinks    Types: 1 Cans of beer per week    Comment:  2 beers/ month  . Drug use: No  . Sexual activity: Not on file  Lifestyle  . Physical activity:  Days per week: Not on file    Minutes per session: Not on file  . Stress: Not on file  Relationships  . Social connections:    Talks on phone: Not on file    Gets together: Not on file    Attends religious service: Not on file    Active member of club or organization: Not on file    Attends meetings of clubs or organizations: Not on file    Relationship status: Not on file  . Intimate partner violence:    Fear of current or ex partner: Not on file    Emotionally abused: Not on file    Physically abused: Not on file    Forced sexual activity: Not on file  Other Topics Concern  . Not on file  Social History Narrative   Married, 1 son.   Lives in [place].   Product/process development scientist.   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       Outpatient Medications Prior to Visit  Medication Sig Dispense Refill  . aspirin EC 81 MG tablet Take 1 tablet (81 mg total) by mouth daily. 30 tablet 0  . LYSINE HCL PO Take by mouth daily. Taking 2-3 x a week    . Multiple Vitamin (MULTIVITAMIN) tablet Take 1 tablet by mouth daily.    [name] omeprazole  (PRILOSEC) 20 MG capsule Take 1 capsule (20 mg total) by mouth daily. 30 capsule 11  . OVER THE COUNTER MEDICATION Super Beta Prostate - 1 cap BID    . pravastatin (PRAVACHOL) 40 MG tablet TAKE 1 TABLET BY MOUTH EVERY DAY 90 tablet 0   No facility-administered medications prior to visit.     Allergies  Allergen Reactions  . Sulfonamide Derivatives     REACTION: rash Because of a history of documented adverse serious drug reaction;Medi Alert bracelet  is recommended    ROS Review of Systems  Constitutional: Negative for appetite change, chills, fatigue and fever.  HENT: Negative for congestion, dental problem, ear pain and sore throat.   Eyes: Negative for discharge, redness and visual disturbance.  Respiratory: Negative for cough, chest tightness, shortness of breath and wheezing.   Cardiovascular: Negative for chest pain, palpitations and leg swelling.  Gastrointestinal: Negative for abdominal pain, blood in stool, diarrhea, nausea and vomiting.  Genitourinary: Negative for difficulty urinating, dysuria, flank pain, frequency, hematuria and urgency.  Musculoskeletal: Negative for arthralgias, back pain, joint swelling, myalgias and neck stiffness.  Skin: Negative for pallor and rash.  Neurological: Negative for dizziness, speech difficulty, weakness and headaches.  Hematological: Negative for adenopathy. Does not bruise/bleed easily.  Psychiatric/Behavioral: Negative for confusion and sleep disturbance. The patient is not nervous/anxious.     PE; Blood pressure 125/75, pulse 65, temperature 97.7 F (36.5 C), temperature source Oral, resp. rate 16, height 5' 10.5" (1.791 m), weight 228 lb 2 oz (103.5 kg), SpO2 96 %. Body mass index is 32.27 kg/m.  Gen: Alert, well appearing.  Patient is oriented to person, place, time, and situation. AFFECT: pleasant, lucid thought and speech. ENT: Ears: EACs clear, normal epithelium.  TMs with good light reflex and landmarks bilaterally.  Eyes:  no injection, icteris, swelling, or exudate.  EOMI, PERRLA. Nose: no drainage or turbinate edema/swelling.  No injection or focal lesion.  Mouth: lips without lesion/swelling.  Oral mucosa pink and moist.  Dentition intact and without obvious caries or gingival swelling.  Oropharynx without erythema, exudate, or swelling.  Neck: supple/nontender.  No LAD, mass, or TM.  Carotid pulses 2+ bilaterally, without bruits. CV: RRR, no m/r/g.   LUNGS: CTA bilat, nonlabored resps, good aeration in all lung fields. ABD: soft, NT, ND, BS normal.  No hepatospenomegaly or mass.  No bruits. EXT: no clubbing, cyanosis, or edema.  Musculoskeletal: no joint swelling, erythema, warmth, or tenderness.  ROM of all joints intact. Skin - no sores or suspicious lesions or rashes or color changes Rectal exam: negative without mass, lesions or tenderness, PROSTATE EXAM: smooth and symmetric without nodules or tenderness.   Pertinent labs:  Lab Results  Component Value Date   TSH 2.41 07/09/2016   Lab Results  Component Value Date   WBC 5.7 07/08/2017   HGB 14.8 07/08/2017   HCT 44.8 07/08/2017   MCV 94.3 07/08/2017   PLT 234.0 07/08/2017   Lab Results  Component Value Date   CREATININE 0.96 07/08/2017   BUN 17 07/08/2017   NA 140 07/08/2017   K 4.5 07/08/2017   CL 106 07/08/2017   CO2 27 07/08/2017   Lab Results  Component Value Date   ALT 20 07/08/2017   AST 15 07/08/2017   ALKPHOS 83 07/08/2017   BILITOT 0.5 07/08/2017   Lab Results  Component Value Date   CHOL 206 (H) 07/08/2017   Lab Results  Component Value Date   HDL 45.40 07/08/2017   Lab Results  Component Value Date   LDLCALC 124 (H) 07/08/2017   Lab Results  Component Value Date   TRIG 181.0 (H) 07/08/2017   Lab Results  Component Value Date   CHOLHDL 5 07/08/2017   Lab Results  Component Value Date   PSA 4.28 (H) 07/09/2016   PSA 2.95 06/20/2015   PSA 2.70 06/19/2014   Lab Results  Component Value Date   HGBA1C  5.3 07/09/2016    ASSESSMENT AND PLAN:   Health maintenance exam: Reviewed age and gender appropriate health maintenance issues (prudent diet, regular exercise, health risks of tobacco and excessive alcohol, use of seatbelts, fire alarms in home, use of sunscreen).  Also reviewed age and gender appropriate health screening as well as vaccine recommendations. Vaccines: flu vaccine UTD.  Pneumovax 23--> given today.   Shingrix---> rx to pharmacy. Labs: FLP, CMET, PSA. Prostate ca screening:  DRE normal today , PSA. Colon ca screening:  Next colonoscopy due 2023.  An After Visit Summary was printed and given to the patient.  FOLLOW UP:  Return in about 1 year (around 07/15/2019) for annual CPE (fasting).  Signed:  Crissie Sickles, MD           07/14/2018

## 2018-07-14 NOTE — Patient Instructions (Signed)

## 2018-07-14 NOTE — Addendum Note (Signed)
Addended by: Onalee Hua on: 07/14/2018 09:08 AM   Modules accepted: Orders

## 2018-07-15 ENCOUNTER — Encounter: Payer: Self-pay | Admitting: Family Medicine

## 2018-08-09 ENCOUNTER — Other Ambulatory Visit: Payer: Self-pay | Admitting: Family Medicine

## 2018-08-09 DIAGNOSIS — E782 Mixed hyperlipidemia: Secondary | ICD-10-CM

## 2018-10-14 DIAGNOSIS — M25552 Pain in left hip: Secondary | ICD-10-CM | POA: Diagnosis not present

## 2018-10-14 DIAGNOSIS — M5137 Other intervertebral disc degeneration, lumbosacral region: Secondary | ICD-10-CM | POA: Diagnosis not present

## 2018-10-14 DIAGNOSIS — M9905 Segmental and somatic dysfunction of pelvic region: Secondary | ICD-10-CM | POA: Diagnosis not present

## 2018-10-14 DIAGNOSIS — M9903 Segmental and somatic dysfunction of lumbar region: Secondary | ICD-10-CM | POA: Diagnosis not present

## 2018-10-26 DIAGNOSIS — M47816 Spondylosis without myelopathy or radiculopathy, lumbar region: Secondary | ICD-10-CM | POA: Diagnosis not present

## 2018-12-06 ENCOUNTER — Emergency Department (HOSPITAL_COMMUNITY): Payer: Medicare HMO

## 2018-12-06 ENCOUNTER — Other Ambulatory Visit: Payer: Self-pay

## 2018-12-06 ENCOUNTER — Inpatient Hospital Stay (HOSPITAL_COMMUNITY)
Admission: EM | Admit: 2018-12-06 | Discharge: 2018-12-13 | DRG: 234 | Disposition: A | Discharge status: Home / Self Care | Payer: Medicare HMO | Attending: Cardiothoracic Surgery | Admitting: Cardiothoracic Surgery

## 2018-12-06 ENCOUNTER — Encounter (HOSPITAL_COMMUNITY): Payer: Self-pay | Admitting: Emergency Medicine

## 2018-12-06 ENCOUNTER — Ambulatory Visit: Payer: Self-pay

## 2018-12-06 DIAGNOSIS — M25562 Pain in left knee: Secondary | ICD-10-CM | POA: Diagnosis present

## 2018-12-06 DIAGNOSIS — E785 Hyperlipidemia, unspecified: Secondary | ICD-10-CM | POA: Diagnosis present

## 2018-12-06 DIAGNOSIS — K219 Gastro-esophageal reflux disease without esophagitis: Secondary | ICD-10-CM | POA: Diagnosis present

## 2018-12-06 DIAGNOSIS — I340 Nonrheumatic mitral (valve) insufficiency: Secondary | ICD-10-CM | POA: Diagnosis not present

## 2018-12-06 DIAGNOSIS — J9811 Atelectasis: Secondary | ICD-10-CM | POA: Diagnosis not present

## 2018-12-06 DIAGNOSIS — J90 Pleural effusion, not elsewhere classified: Secondary | ICD-10-CM

## 2018-12-06 DIAGNOSIS — Z7982 Long term (current) use of aspirin: Secondary | ICD-10-CM

## 2018-12-06 DIAGNOSIS — Z86010 Personal history of colonic polyps: Secondary | ICD-10-CM | POA: Diagnosis not present

## 2018-12-06 DIAGNOSIS — I200 Unstable angina: Secondary | ICD-10-CM | POA: Diagnosis not present

## 2018-12-06 DIAGNOSIS — Z4682 Encounter for fitting and adjustment of non-vascular catheter: Secondary | ICD-10-CM | POA: Diagnosis not present

## 2018-12-06 DIAGNOSIS — E8770 Fluid overload, unspecified: Secondary | ICD-10-CM | POA: Diagnosis not present

## 2018-12-06 DIAGNOSIS — D62 Acute posthemorrhagic anemia: Secondary | ICD-10-CM | POA: Diagnosis not present

## 2018-12-06 DIAGNOSIS — R079 Chest pain, unspecified: Secondary | ICD-10-CM | POA: Diagnosis not present

## 2018-12-06 DIAGNOSIS — R0789 Other chest pain: Secondary | ICD-10-CM | POA: Diagnosis present

## 2018-12-06 DIAGNOSIS — I4891 Unspecified atrial fibrillation: Secondary | ICD-10-CM | POA: Diagnosis not present

## 2018-12-06 DIAGNOSIS — Z79899 Other long term (current) drug therapy: Secondary | ICD-10-CM

## 2018-12-06 DIAGNOSIS — I252 Old myocardial infarction: Secondary | ICD-10-CM | POA: Diagnosis not present

## 2018-12-06 DIAGNOSIS — I214 Non-ST elevation (NSTEMI) myocardial infarction: Principal | ICD-10-CM | POA: Diagnosis present

## 2018-12-06 DIAGNOSIS — I2510 Atherosclerotic heart disease of native coronary artery without angina pectoris: Secondary | ICD-10-CM | POA: Diagnosis present

## 2018-12-06 DIAGNOSIS — Z882 Allergy status to sulfonamides status: Secondary | ICD-10-CM | POA: Diagnosis not present

## 2018-12-06 DIAGNOSIS — D696 Thrombocytopenia, unspecified: Secondary | ICD-10-CM | POA: Diagnosis not present

## 2018-12-06 DIAGNOSIS — E8881 Metabolic syndrome: Secondary | ICD-10-CM | POA: Diagnosis present

## 2018-12-06 DIAGNOSIS — M25561 Pain in right knee: Secondary | ICD-10-CM | POA: Diagnosis present

## 2018-12-06 DIAGNOSIS — E669 Obesity, unspecified: Secondary | ICD-10-CM | POA: Diagnosis present

## 2018-12-06 DIAGNOSIS — I25110 Atherosclerotic heart disease of native coronary artery with unstable angina pectoris: Secondary | ICD-10-CM | POA: Diagnosis present

## 2018-12-06 DIAGNOSIS — G8929 Other chronic pain: Secondary | ICD-10-CM | POA: Diagnosis present

## 2018-12-06 DIAGNOSIS — G4733 Obstructive sleep apnea (adult) (pediatric): Secondary | ICD-10-CM | POA: Diagnosis present

## 2018-12-06 DIAGNOSIS — Z6830 Body mass index (BMI) 30.0-30.9, adult: Secondary | ICD-10-CM | POA: Diagnosis not present

## 2018-12-06 DIAGNOSIS — N400 Enlarged prostate without lower urinary tract symptoms: Secondary | ICD-10-CM | POA: Diagnosis present

## 2018-12-06 DIAGNOSIS — Z8249 Family history of ischemic heart disease and other diseases of the circulatory system: Secondary | ICD-10-CM

## 2018-12-06 DIAGNOSIS — M170 Bilateral primary osteoarthritis of knee: Secondary | ICD-10-CM | POA: Diagnosis present

## 2018-12-06 DIAGNOSIS — Z951 Presence of aortocoronary bypass graft: Secondary | ICD-10-CM

## 2018-12-06 DIAGNOSIS — N529 Male erectile dysfunction, unspecified: Secondary | ICD-10-CM | POA: Diagnosis present

## 2018-12-06 DIAGNOSIS — E291 Testicular hypofunction: Secondary | ICD-10-CM | POA: Diagnosis present

## 2018-12-06 DIAGNOSIS — Z01810 Encounter for preprocedural cardiovascular examination: Secondary | ICD-10-CM | POA: Diagnosis not present

## 2018-12-06 LAB — TROPONIN I: TROPONIN I: 0.19 ng/mL — AB (ref <0.03)

## 2018-12-06 LAB — BASIC METABOLIC PANEL
Anion gap: 7 (ref 5–15)
BUN: 18 mg/dL (ref 8–23)
CO2: 22 mmol/L (ref 22–32)
Calcium: 8.5 mg/dL — ABNORMAL LOW (ref 8.9–10.3)
Chloride: 110 mmol/L (ref 98–111)
Creatinine, Ser: 0.9 mg/dL (ref 0.61–1.24)
GFR calc non Af Amer: >60 mL/min (ref >60)
Glucose, Bld: 154 mg/dL — ABNORMAL HIGH (ref 70–99)
Potassium: 3.6 mmol/L (ref 3.5–5.1)
Sodium: 139 mmol/L (ref 135–145)

## 2018-12-06 LAB — CBC
HCT: 44 % (ref 39.0–52.0)
Hemoglobin: 14.7 g/dL (ref 13.0–17.0)
MCH: 30.9 pg (ref 26.0–34.0)
MCHC: 33.4 g/dL (ref 30.0–36.0)
MCV: 92.4 fL (ref 80.0–100.0)
PLATELETS: 213 10*3/uL (ref 150–400)
RBC: 4.76 MIL/uL (ref 4.22–5.81)
RDW: 12.5 % (ref 11.5–15.5)
WBC: 6.9 10*3/uL (ref 4.0–10.5)
nRBC: 0 % (ref 0.0–0.2)

## 2018-12-06 LAB — APTT: aPTT: 28 seconds (ref 24–36)

## 2018-12-06 LAB — PROTIME-INR
INR: 0.9 (ref 0.8–1.2)
PROTHROMBIN TIME: 12.5 s (ref 11.4–15.2)

## 2018-12-06 MED ORDER — ASPIRIN EC 81 MG PO TBEC: 81.0000 mg | DELAYED_RELEASE_TABLET | Freq: Every day | ORAL | Status: DC

## 2018-12-06 MED ORDER — ATORVASTATIN CALCIUM 80 MG PO TABS
80.0000 mg | ORAL_TABLET | Freq: Every day | ORAL | Status: DC
  Administered 2018-12-07 – 2018-12-12 (×5): 80 mg via ORAL
  Filled 2018-12-06 (×5): qty 1

## 2018-12-06 MED ORDER — ONDANSETRON HCL 4 MG/2ML IJ SOLN: 4.0000 mg | Freq: Four times a day (QID) | INTRAMUSCULAR | Status: DC | PRN

## 2018-12-06 MED ORDER — ACETAMINOPHEN 325 MG PO TABS: 650.0000 mg | ORAL_TABLET | ORAL | Status: DC | PRN

## 2018-12-06 MED ORDER — METOPROLOL SUCCINATE ER 25 MG PO TB24
25.0000 mg | ORAL_TABLET | Freq: Every day | ORAL | Status: DC
  Administered 2018-12-07: 25 mg via ORAL
  Filled 2018-12-06: qty 1

## 2018-12-06 MED ORDER — LISINOPRIL 5 MG PO TABS
5.0000 mg | ORAL_TABLET | Freq: Every day | ORAL | Status: DC
  Administered 2018-12-07: 5 mg via ORAL
  Filled 2018-12-06: qty 1

## 2018-12-06 MED ORDER — HEPARIN (PORCINE) 25000 UT/250ML-% IV SOLN
1400.0000 [IU]/h | INTRAVENOUS | Status: DC
  Administered 2018-12-06: 1250 [IU]/h via INTRAVENOUS
  Administered 2018-12-07: 1400 [IU]/h via INTRAVENOUS
  Filled 2018-12-06 (×2): qty 250

## 2018-12-06 MED ORDER — NITROGLYCERIN 0.4 MG SL SUBL: 0.4000 mg | SUBLINGUAL_TABLET | SUBLINGUAL | Status: DC | PRN

## 2018-12-06 MED ORDER — ASPIRIN 81 MG PO CHEW
324.0000 mg | CHEWABLE_TABLET | Freq: Once | ORAL | Status: AC
  Administered 2018-12-06: 324 mg via ORAL
  Filled 2018-12-06: qty 4

## 2018-12-06 MED ORDER — HEPARIN BOLUS VIA INFUSION
4000.0000 [IU] | INTRAVENOUS | Status: AC
  Administered 2018-12-06: 4000 [IU] via INTRAVENOUS
  Filled 2018-12-06: qty 4000

## 2018-12-06 MED ORDER — PANTOPRAZOLE SODIUM 40 MG PO TBEC
40.0000 mg | DELAYED_RELEASE_TABLET | Freq: Every day | ORAL | Status: DC
  Administered 2018-12-07 – 2018-12-13 (×6): 40 mg via ORAL
  Filled 2018-12-06 (×7): qty 1

## 2018-12-06 NOTE — ED Notes (Signed)
ED TO INPATIENT HANDOFF REPORT  ED Nurse Name and Phone #: [phone]  S Name/Age/Gender Tyler [name] 68 y.o. male Room/Bed: WA23/WA23  Code Status   Code Status: Not on file  Home/SNF/Other Home Patient oriented to: self, place, time and situation Is this baseline? Yes   Triage Complete: Triage complete  Chief Complaint Chest Pain/Pain in arm area   Triage Note Pt c/o intermittent chest pains for 3 weeks that radiates to bilat arms. Reports that pains are becoming more frequent.  [place] in the past [place] for work. Reports took ASA 81mg  yesterday.    Allergies Allergies  Allergen Reactions  . Sulfonamide Derivatives     REACTION: rash Because of a history of documented adverse serious drug reaction;Medi Alert bracelet  is recommended    Level of Care/Admitting Diagnosis ED Disposition    ED Disposition Condition [place]: [place] [[id number]]  Level of Care: Progressive [102]  Diagnosis: Non-STEMI (non-ST elevated myocardial infarction) [facility]) [[id number]]  Admitting Physician: [place]  Attending Physician: Sanda Klein [[id number]]  Estimated length of stay: past midnight tomorrow  Certification:: I certify this patient will need inpatient services for at least 2 midnights  PT Class (Do Not Modify): Inpatient [101]  PT Acc Code (Do Not Modify): Private [1]       B Medical/Surgery History Past Medical History:  Diagnosis Date  . BPH (benign prostatic hypertrophy)    Flomax helpful 2018/19  . Chronic pain of right knee    Left knee as well (osteoarthritis).  Dr. Durward Fortes  . Colon polyps 2013   NON-adenomatous 2013---recall 10 yrs.  . Elevated PSA 07/10/2016   Dr. Gaynelle Arabian saw him 07/15/16, did f/u PSA and it was 2.78: f/u was recommended but pt did not do this as of 07/2017.  Repeat PSA here 07/2017 was 3.1, with 19% free (free a little low).  Pt then followed up with Dr. Gaynelle Arabian and tx for BPH  w/plan of repeat PSA 1 yr.  PSA 07/2018 down to 3.21.  [name] Erectile dysfunction    Urol rx'd sildenafil 20mg  tabs 11/2017.  [name] Fatigue    + excessive daytime somnolence  . GERD (gastroesophageal reflux disease)   . Hallux limitus 04/2016   1st MPJ joint R foot.  Diclofenac helpful.  Injected by podiatrist 11/2016.  [name] Hyperlipidemia   . Hypogonadism male   . IFG (impaired fasting glucose)    A1c 5.8% 2014   Past Surgical History:  Procedure Laterality Date  . COLONOSCOPY  1998 & 2013    Dr [name]; 2 benign polyps 2013--recall 2023.  . hydrocoelectomy    . nocturnal polysomn  1990s   Sleep lab: no OSA (??)--Dr. [name] said he needs another sleep study as of 2013  . ORBITAL FRACTURE SURGERY    . TONSILLECTOMY AND ADENOIDECTOMY    . UPPER GI ENDOSCOPY  2000 & 2011   dilation 2000     A IV Location/Drains/Wounds Patient Lines/Drains/Airways Status   Active Line/Drains/Airways    Name:   Placement date:   Placement time:   Site:   Days:   Peripheral IV 12/06/18 Left Forearm   12/06/18    1639    Forearm   less than 1          Intake/Output Last 24 hours No intake or output data in the 24 hours ending 12/06/18 1822  Labs/Imaging Results for orders placed or performed during the hospital encounter  of 12/06/18 (from the past 48 hour(s))  Basic metabolic panel     Status: Abnormal   Collection Time: 12/06/18  4:28 PM  Result Value Ref Range   Sodium 139 135 - 145 mmol/L   Potassium 3.6 3.5 - 5.1 mmol/L   Chloride 110 98 - 111 mmol/L   CO2 22 22 - 32 mmol/L   Glucose, Bld 154 (H) 70 - 99 mg/dL   BUN 18 8 - 23 mg/dL   Creatinine, Ser 0.90 0.61 - 1.24 mg/dL   Calcium 8.5 (L) 8.9 - 10.3 mg/dL   GFR calc non Af Amer >60 >60 mL/min   GFR calc Af Amer >60 >60 mL/min   Anion gap 7 5 - 15    Comment: Performed at [facility], [place] [address] [place], [place] [address]  CBC     Status: None   Collection Time: 12/06/18  4:28 PM  Result Value Ref Range   WBC 6.9  4.0 - 10.5 K/uL   RBC 4.76 4.22 - 5.81 MIL/uL   Hemoglobin 14.7 13.0 - 17.0 g/dL   HCT 44.0 39.0 - 52.0 %   MCV 92.4 80.0 - 100.0 fL   MCH 30.9 26.0 - 34.0 pg   MCHC 33.4 30.0 - 36.0 g/dL   RDW 12.5 11.5 - 15.5 %   Platelets 213 150 - 400 K/uL   nRBC 0.0 0.0 - 0.2 %    Comment: Performed at [facility], [place] [address] [place], [place] [address]  Troponin I - ONCE - STAT     Status: Abnormal   Collection Time: 12/06/18  4:28 PM  Result Value Ref Range   Troponin I 0.19 (HH) <0.03 ng/mL    Comment: CRITICAL RESULT CALLED TO, READ BACK BY AND VERIFIED WITH: Ouida Sills [id number] @ [id number] BY J SCOTTON Performed at [place] [address], [place], [place] [address]   Protime-INR     Status: None   Collection Time: 12/06/18  4:28 PM  Result Value Ref Range   Prothrombin Time 12.5 11.4 - 15.2 seconds   INR 0.9 0.8 - 1.2    Comment: (NOTE) INR goal varies based on device and disease states. Performed at [facility], [place] [address] [place], [place] [address]   APTT     Status: None   Collection Time: 12/06/18  4:28 PM  Result Value Ref Range   aPTT 28 24 - 36 seconds    Comment: Performed at [facility], [place] [address], [place], [place] [address]   Dg Chest 2 View  Result Date: 12/06/2018 CLINICAL DATA:  Intermittent chest pain for 2 weeks EXAM: CHEST - 2 VIEW COMPARISON:  10/25/2007 FINDINGS: Normal heart size, mediastinal contours, and pulmonary vascularity. Mild RIGHT basilar atelectasis. Lungs otherwise clear. No infiltrate, pleural effusion or pneumothorax. Bones unremarkable. IMPRESSION: Mild RIGHT basilar atelectasis. Electronically Signed   By: Lavonia Dana M.D.   On: 12/06/2018 16:01    Pending Labs Unresulted Labs (From admission, onward)    Start     Ordered   12/07/18 0015  Heparin level (unfractionated)  Once-Timed,   R     12/06/18 1738          Vitals/Pain Today's Vitals    12/06/18 1543 12/06/18 1546 12/06/18 1747 12/06/18 1750  BP:  (!) 145/88  (!) 150/79  Pulse:  85  79  Resp:  20  18  Temp:  98.1 F (36.7 C)  TempSrc:  Oral    SpO2:  98%  100%  Weight: 102.1 kg     Height: 5\' 11"  (1.803 m)     PainSc: 7   0-No pain 0-No pain    Isolation Precautions No active isolations  Medications Medications  heparin ADULT infusion 100 units/mL (25000 units/258mL sodium chloride 0.45%) (1,250 Units/hr Intravenous New Bag/Given 12/06/18 1746)  aspirin chewable tablet 324 mg (324 mg Oral Given 12/06/18 1724)  heparin bolus via infusion 4,000 Units (4,000 Units Intravenous Bolus from Bag 12/06/18 1746)    Mobility walks Low fall risk   Focused Assessments Cardiac Assessment Handoff:  Cardiac Rhythm: Normal sinus rhythm Lab Results  Component Value Date   CKTOTAL 119 10/26/2007   CKMB 1.5 10/26/2007   TROPONINI 0.19 ([place]) 12/06/2018   No results found for: DDIMER Does the Patient currently have chest pain? No     R Recommendations: See Admitting Provider Note  Report given to:   Additional Notes: Pt currently on a heparin drip.

## 2018-12-06 NOTE — Telephone Encounter (Signed)
Thank you.  I wholeheartedly agree with this excellent triage.

## 2018-12-06 NOTE — ED Provider Notes (Addendum)
[place] DEPT Provider Note   CSN: [id number] Arrival date & time: 12/06/18  1535    History   Chief Complaint Chief Complaint  Patient presents with  . Chest Pain    HPI Tyler [name] is a 68 y.o. male.   HPI Patient presents with chest pain.  Has had over the last 3 weeks.  Comes and goes.  Sometimes comes on whenever it wants to and will last a few minutes.  States that yesterday he had a walk and that it came on with exertion.  States he thinks is been coming on more with exertion.  No fevers.  No cough.  States it is dull in his mid chest.  Will go to his arms.  States it seems to be improved with rest.  He has high cholesterol.  States around 12 years ago had a negative stress test.  No known cardiac disease.  No swelling in his legs.  No fevers.  No cough.    Past Medical History:  Diagnosis Date  . BPH (benign prostatic hypertrophy)    Flomax helpful 2018/19  . Chronic pain of right knee    Left knee as well (osteoarthritis).  Dr. Durward Fortes  . Colon polyps 2013   NON-adenomatous 2013---recall 10 yrs.  . Elevated PSA 07/10/2016   Dr. Gaynelle Arabian saw him 07/15/16, did f/u PSA and it was 2.78: f/u was recommended but pt did not do this as of 07/2017.  Repeat PSA here 07/2017 was 3.1, with 19% free (free a little low).  Pt then followed up with Dr. Gaynelle Arabian and tx for BPH w/plan of repeat PSA 1 yr.  PSA 07/2018 down to 3.21.  [name] Erectile dysfunction    Urol rx'd sildenafil 20mg  tabs 11/2017.  [name] Fatigue    + excessive daytime somnolence  . GERD (gastroesophageal reflux disease)   . Hallux limitus 04/2016   1st MPJ joint R foot.  Diclofenac helpful.  Injected by podiatrist 11/2016.  [name] Hyperlipidemia   . Hypogonadism male   . IFG (impaired fasting glucose)    A1c 5.8% 2014    Patient Active Problem List   Diagnosis Date Noted  . Non-STEMI (non-ST elevated myocardial infarction) ([place]) 12/06/2018  . Obstructive sleep apnea 08/10/2015   . Health maintenance examination 06/26/2014  . Hypogonadism male   . Chronic fatigue 04/30/2014  . Low serum testosterone level 06/23/2013  . Benign lymphoid polyp of colon 06/22/2013  . HYPERSOMNIA 11/08/2010  . Exercise induced bronchospasm 10/18/2010  . 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 ERECTILE DYSFUNCTION 08/18/2008  . Urinary frequency 11/22/2007  . HYPERLIPIDEMIA 10/25/2007  . GERD (gastroesophageal reflux disease) 10/25/2007    Past Surgical History:  Procedure Laterality Date  . COLONOSCOPY  1998 & 2013    Dr [name]; 2 benign polyps 2013--recall 2023.  . hydrocoelectomy    . nocturnal polysomn  1990s   Sleep lab: no OSA (??)--Dr. [name] said he needs another sleep study as of 2013  . ORBITAL FRACTURE SURGERY    . TONSILLECTOMY AND ADENOIDECTOMY    . UPPER GI ENDOSCOPY  2000 & 2011   dilation 2000        Home Medications    Prior to Admission medications   Medication Sig Start Date End Date Taking? Authorizing Provider  aspirin EC 81 MG tablet Take 1 tablet (81 mg total) by mouth daily. Patient taking differently: Take 81 mg by mouth once.  06/26/14  Yes McGowen, Adrian Blackwater, MD  ibuprofen (ADVIL) 200 MG tablet Take 400 mg by mouth every 6 (six) hours as needed for moderate pain.   Yes [provider]  omeprazole (PRILOSEC) 20 MG capsule Take 1 capsule (20 mg total) by mouth daily. 10/31/15  Yes McGowen, Adrian Blackwater, MD  OVER THE COUNTER MEDICATION Take 1 capsule by mouth 2 (two) times daily. Super Beta Prostate -   Yes [provider]  pravastatin (PRAVACHOL) 40 MG tablet TAKE 1 TABLET BY MOUTH EVERY DAY 08/09/18  Yes McGowen, Adrian Blackwater, MD    Family History Family History  Problem Relation Age of Onset  . Cancer Mother        Gynecologic  . COPD Mother   . Heart attack Father 48        CHF  . Heart attack Maternal Grandfather        > 55  . Heart attack Paternal Grandfather        >55  . Colon cancer Neg Hx   . Stomach cancer  Neg Hx   . Diabetes Neg Hx   . Stroke Neg Hx     Social History Social History   Tobacco Use  . Smoking status: Never Smoker  . Smokeless tobacco: Never Used  Substance Use Topics  . Alcohol use: Yes    Alcohol/week: 1.0 standard drinks    Types: 1 Cans of beer per week    Comment:  2 beers/ month  . Drug use: No     Allergies   Sulfonamide derivatives   Review of Systems Review of Systems  Constitutional: Negative for appetite change.  HENT: Negative for congestion.   Respiratory: Negative for shortness of breath.   Cardiovascular: Positive for chest pain.  Gastrointestinal: Negative for abdominal pain.  Genitourinary: Negative for flank pain.  Musculoskeletal: Negative for back pain and gait problem.  Skin: Negative for rash.  Neurological: Negative for weakness.  Psychiatric/Behavioral: Negative for confusion.     Physical Exam Updated Vital Signs BP (!) 145/88 (BP Location: Left Arm)   Pulse 85   Temp 98.1 F (36.7 C) (Oral)   Resp 20   Ht 5\' 11"  (1.803 m)   Wt 102.1 kg   SpO2 98%   BMI 31.38 kg/m   Physical Exam Vitals signs reviewed.  HENT:     Head: Atraumatic.  Eyes:     Pupils: Pupils are equal, round, and reactive to light.  Neck:     Musculoskeletal: Neck supple.  Cardiovascular:     Rate and Rhythm: Regular rhythm.     Heart sounds: No murmur.  Pulmonary:     Effort: Pulmonary effort is normal.     Breath sounds: No wheezing, rhonchi or rales.  Abdominal:     General: There is no abdominal bruit.  Musculoskeletal:     Right lower leg: He exhibits no tenderness.  Skin:    General: Skin is warm.     Capillary Refill: Capillary refill takes less than 2 seconds.  Neurological:     General: No focal deficit present.     Mental Status: He is alert.      ED Treatments / Results  Labs (all labs ordered are listed, but only abnormal results are displayed) Labs Reviewed  BASIC METABOLIC PANEL - Abnormal; Notable for the following  components:      Result Value   Glucose, Bld 154 (*)    Calcium 8.5 (*)    All other components within normal limits  TROPONIN I -  Abnormal; Notable for the following components:   Troponin I 0.19 (*)    All other components within normal limits  CBC  PROTIME-INR  APTT  HEPARIN LEVEL (UNFRACTIONATED)    EKG EKG Interpretation  Date/Time:  Monday December 06 2018 15:44:59 EDT Ventricular Rate:  84 PR Interval:    QRS Duration: 98 QT Interval:  370 QTC Calculation: 438 R Axis:   58 Text Interpretation:  Sinus rhythm Minimal ST depression, diffuse leads inferior simmilar. lateral new. Confirmed by Davonna Belling [phone]) on 12/06/2018 3:56:50 PM   Radiology Dg Chest 2 View  Result Date: 12/06/2018 CLINICAL DATA:  Intermittent chest pain for 2 weeks EXAM: CHEST - 2 VIEW COMPARISON:  10/25/2007 FINDINGS: Normal heart size, mediastinal contours, and pulmonary vascularity. Mild RIGHT basilar atelectasis. Lungs otherwise clear. No infiltrate, pleural effusion or pneumothorax. Bones unremarkable. IMPRESSION: Mild RIGHT basilar atelectasis. Electronically Signed   By: Lavonia Dana M.D.   On: 12/06/2018 16:01    Procedures Procedures (including critical care time)  Medications Ordered in ED Medications  heparin ADULT infusion 100 units/mL (25000 units/244mL sodium chloride 0.45%) (1,250 Units/hr Intravenous New Bag/Given 12/06/18 1746)  aspirin chewable tablet 324 mg (324 mg Oral Given 12/06/18 1724)  heparin bolus via infusion 4,000 Units (4,000 Units Intravenous Bolus from Bag 12/06/18 1746)     Initial Impression / Assessment and Plan / ED Course  I have reviewed the triage vital signs and the nursing notes.  Pertinent labs & imaging results that were available during my care of the patient were reviewed by me and considered in my medical decision making (see chart for details).     HEAR Score: 5  Patient with chest pain.  Has had over the last 3 weeks but potentially coming on  with less exertion at this point.  Pain-free now.  Nonspecific EKG changes may have some new lateral depression.  Initial troponin elevated at 0.19.  Will discuss with cardiology.  Will require admission the hospital.  Heparin drip started with the elevated troponin.  Unstable angina versus non-STEMI.  CRITICAL CARE Performed by: Davonna Belling Total critical care time: 30 minutes Critical care time was exclusive of separately billable procedures and treating other patients. Critical care was necessary to treat or prevent imminent or life-threatening deterioration. Critical care was time spent personally by me on the following activities: development of treatment plan with patient and/or surrogate as well as nursing, discussions with consultants, evaluation of patient's response to treatment, examination of patient, obtaining history from patient or surrogate, ordering and performing treatments and interventions, ordering and review of laboratory studies, ordering and review of radiographic studies, pulse oximetry and re-evaluation of patient's condition.  Discussed with cardiology.  Recommended transfer to [facility].  Final Clinical Impressions(s) / ED Diagnoses   Final diagnoses:  Unstable angina [facility])  NSTEMI (non-ST elevated myocardial infarction) [facility])    ED Discharge Orders    None       Davonna Belling, MD 12/06/18 1730    Davonna Belling, MD 12/06/18 1754

## 2018-12-06 NOTE — Progress Notes (Signed)
Seen and examined by Dr. Wille Glaser.

## 2018-12-06 NOTE — Progress Notes (Signed)
Admitted pt from [facility] assisted by the [facility] team per stretcher. Pt is alert, oriented x4, denies chest pain, denies nausea and vomiting, not in respiratory distress. Pt placed on telemetry box 9 and verified by the  NT. Oriented to room and call bell. MD on call notified of the admission. Skin assessment done with the CN Ivin Booty.    12/06/18 2021  Vitals  Temp 97.8 F (36.6 C)  Temp Source Oral  BP (!) 151/95  MAP (mmHg) 111  BP Location Right Arm  BP Method Automatic  Patient Position (if appropriate) Lying  Pulse Rate 72  ECG Heart Rate 72  Resp 20  Oxygen Therapy  SpO2 98 %  O2 Device Room Air  Pain Assessment  Pain Score 0  Height and Weight  Height 5\' 11"  (1.803 m)  Weight 100.6 kg  Type of Scale Used Standing  Type of Weight Actual  BSA (Calculated - sq m) 2.24 sq meters  BMI (Calculated) 30.95  Weight in (lb) to have BMI = 25 178.9

## 2018-12-06 NOTE — ED Notes (Signed)
[facility] called @ 18:20.

## 2018-12-06 NOTE — Telephone Encounter (Signed)
SW patient regarding symptoms.  Patient reports sporadic CP x 2 weeks. When this occurs, does have SOB and pain scale 7-9 out of 10. Patient denies symptoms at present time. Patient with FH of cardiac disease, advised to go to ER for assessment. Patient verbalized understanding, plans to go to [facility] ER today.

## 2018-12-06 NOTE — ED Notes (Signed)
Patient aware of plan of care. Patient is alert and oriented and advised not to get up from bed without asking for assistance as he is on heparin IV. Pt is agreeable to plan of care and transfer to [facility] for cardiac services

## 2018-12-06 NOTE — Progress Notes (Signed)
ANTICOAGULATION CONSULT NOTE - Initial Consult  Pharmacy Consult for Heparin Indication: chest pain/ACS  Allergies  Allergen Reactions  . Sulfonamide Derivatives     REACTION: rash Because of a history of documented adverse serious drug reaction;Medi Alert bracelet  is recommended    Patient Measurements: Height: 5\' 11"  (180.3 cm) Weight: 225 lb (102.1 kg) IBW/kg (Calculated) : 75.3 Heparin Dosing Weight: 96.5 kg  Vital Signs: Temp: 98.1 F (36.7 C) (03/30 1546) Temp Source: Oral (03/30 1546) BP: 145/88 (03/30 1546) Pulse Rate: 85 (03/30 1546)  Labs: Recent Labs    12/06/18 1628  HGB 14.7  HCT 44.0  PLT 213  CREATININE 0.90  TROPONINI 0.19*    Estimated Creatinine Clearance: 99.5 mL/min (by C-G formula based on SCr of 0.9 mg/dL).   Medical History: Past Medical History:  Diagnosis Date  . BPH (benign prostatic hypertrophy)    Flomax helpful 2018/19  . Chronic pain of right knee    Left knee as well (osteoarthritis).  Dr. Durward Fortes  . Colon polyps 2013   NON-adenomatous 2013---recall 10 yrs.  . Elevated PSA 07/10/2016   Dr. Gaynelle Arabian saw him 07/15/16, did f/u PSA and it was 2.78: f/u was recommended but pt did not do this as of 07/2017.  Repeat PSA here 07/2017 was 3.1, with 19% free (free a little low).  Pt then followed up with Dr. Gaynelle Arabian and tx for BPH w/plan of repeat PSA 1 yr.  PSA 07/2018 down to 3.21.  [name] Erectile dysfunction    Urol rx'd sildenafil 20mg  tabs 11/2017.  [name] Fatigue    + excessive daytime somnolence  . GERD (gastroesophageal reflux disease)   . Hallux limitus 04/2016   1st MPJ joint R foot.  Diclofenac helpful.  Injected by podiatrist 11/2016.  [name] Hyperlipidemia   . Hypogonadism male   . IFG (impaired fasting glucose)    A1c 5.8% 2014    Medications:  Infusions:   Assessment: 71 yoM [facility] ED [facility] 3/30 with chest pain.  Pharmacy is consulted to dose Heparin. No PTA anticoagulation noted.  Today, 12/06/2018: Baseline coags  pending CBC: Hgb 14.7, Plt 213 SCr 0.9, CrCl ~ 100 ml/min Trop: 0.19  Goal of Therapy:  Heparin level 0.3-0.7 units/ml Monitor platelets by anticoagulation protocol: Yes   Plan:   Baseline PTT, PT/INR  Give heparin 4000 units bolus IV x 1  Start heparin IV infusion at 1250 units/hr  Heparin level 6 hours after starting  Daily heparin level and [place] PharmD, BCPS Pager [phone] 12/06/2018,5:26 PM

## 2018-12-06 NOTE — ED Triage Notes (Signed)
Pt c/o intermittent chest pains for 3 weeks that radiates to bilat arms. Reports that pains are becoming more frequent.  [place] in [place] in the past [place] for work. Reports took ASA 81mg  yesterday.

## 2018-12-06 NOTE — ED Notes (Signed)
Date and time results received: 12/06/18 1707 (use smartphrase ".now" to insert current time)  Test: troponin Critical Value: 0.19  Name of Provider Notified: Dr. Delane Ginger. [place]  Orders Received? Or Actions Taken?: notified Dr Davonna Belling of troponin 0.19.

## 2018-12-06 NOTE — Telephone Encounter (Signed)
Patient called and says for the past couple of weeks, he's been having off and on chest pain to the center of his sternum and it moves to both of his arms to the muscle to the elbow, more on the right than the left. He says the pain is a 8.5-9 when it happens. He says last night it happened and he took a walk, then came back home, sat down and within 5 minutes the pain was gone. He says he took an aspirin 81 mg yesterday morning when he had the pain. He says this morning he had it and it lasted 1 minute, he was sitting in the chair, then it went away. He says at this time, he is not having pain, just a tightness feeling in the muscle of his arm. He says this may not matter, but he says last month he was lifting something and pulled something in his chest. He says that's where he feels the pain, in that area of the muscle he pulled. He denies any other symptoms, no SOB, no fever, no dizziness, no nausea, no vomiting, no sweating, no cough. I asked does he have capability of a webex visit, he says yes, email verified. I called the office and spoke to [place], [facility] who advised to send the note over for the RN in the office to review and someone will call the patient back with the recommendation. I advised the patient of the above, care advice given, patient verbalized understanding. CB on cell number listed in chart.   Reason for Disposition . [1] Chest pain lasts > 5 minutes AND [2] occurred > 3 days ago (72 hours) AND [3] NO chest pain or cardiac symptoms now  Answer Assessment - Initial Assessment Questions 1. LOCATION: "Where does it hurt?"       Mid sternum 2. RADIATION: "Does the pain go anywhere else?" (e.g., into neck, jaw, arms, back)     Down both arms to the elbows 3. ONSET: "When did the chest pain begin?" (Minutes, hours or days)      Couple of weeks ago 4. PATTERN "Does the pain come and go, or has it been constant since it started?"  "Does it get worse with exertion?"      Come and go; gets  worse with exertion 5. DURATION: "How long does it last" (e.g., seconds, minutes, hours)     Probably 1-2 minutes 6. SEVERITY: "How bad is the pain?"  (e.g., Scale 1-10; mild, moderate, or severe)    - MILD (1-3): doesn't interfere with normal activities     - MODERATE (4-7): interferes with normal activities or awakens from sleep    - SEVERE (8-10): excruciating pain, unable to do any normal activities       8-9 7. CARDIAC RISK FACTORS: "Do you have any history of heart problems or risk factors for heart disease?" (e.g., prior heart attack, angina; high blood pressure, diabetes, being overweight, high cholesterol, smoking, or strong family history of heart disease)     High cholesterol 8. PULMONARY RISK FACTORS: "Do you have any history of lung disease?"  (e.g., blood clots in lung, asthma, emphysema, birth control pills)     No 9. CAUSE: "What do you think is causing the chest pain?"     I don't know 10. OTHER SYMPTOMS: "Do you have any other symptoms?" (e.g., dizziness, nausea, vomiting, sweating, fever, difficulty breathing, cough)     No 11. PREGNANCY: "Is there any chance you are pregnant?" "When was  your last menstrual period?"     N/A  Protocols used: CHEST PAIN-A-AH

## 2018-12-06 NOTE — H&P (Signed)
Cardiology History & Physical    Patient ID: Tyler [name] MRN: [id number], DOB: [birth year] Date of Encounter: 12/06/2018, 9:36 PM Primary Physician: Tammi Sou, MD  Chief Complaint: Chest pain   HPI: Tyler [name] is a 68 y.o. male with history of HLD, GERD who p/w CP.  Pt has noted intermitted substernal chest discomfort radiating to b/l arms over the past 2 weeks.  Symptoms have been provoked by exertion to some extent but have also occurred at rest.  Yesterday, he set out on a walk and almost immediately had significant chest discomfort that stopped after resting for 5 minutes.  He had further 1-2 episodes of pain at rest last night and earlier today.  He presented to the [facility] ED for evaluation, where initial ECG showed mild inferolateral ST depression.  First troponin was elevated at 0.19 and the remainder of labs were WNL.  He was started on IV heparin and admitted to the [facility] cardiology service for further management.  Past Medical History:  Diagnosis Date  . BPH (benign prostatic hypertrophy)    Flomax helpful 2018/19  . Chronic pain of right knee    Left knee as well (osteoarthritis).  Dr. Durward Fortes  . Colon polyps 2013   NON-adenomatous 2013---recall 10 yrs.  . Elevated PSA 07/10/2016   Dr. Gaynelle Arabian saw him 07/15/16, did f/u PSA and it was 2.78: f/u was recommended but pt did not do this as of 07/2017.  Repeat PSA here 07/2017 was 3.1, with 19% free (free a little low).  Pt then followed up with Dr. Gaynelle Arabian and tx for BPH w/plan of repeat PSA 1 yr.  PSA 07/2018 down to 3.21.  [name] Erectile dysfunction    Urol rx'd sildenafil 20mg  tabs 11/2017.  [name] Fatigue    + excessive daytime somnolence  . GERD (gastroesophageal reflux disease)   . Hallux limitus 04/2016   1st MPJ joint R foot.  Diclofenac helpful.  Injected by podiatrist 11/2016.  [name] Hyperlipidemia   . Hypogonadism male   . IFG (impaired fasting glucose)    A1c 5.8% 2014     Surgical History:  Past  Surgical History:  Procedure Laterality Date  . COLONOSCOPY  1998 & 2013    Dr [name]; 2 benign polyps 2013--recall 2023.  . hydrocoelectomy    . nocturnal polysomn  1990s   Sleep lab: no OSA (??)--Dr. [name] said he needs another sleep study as of 2013  . ORBITAL FRACTURE SURGERY    . TONSILLECTOMY AND ADENOIDECTOMY    . UPPER GI ENDOSCOPY  2000 & 2011   dilation 2000     Home Meds: Prior to Admission medications   Medication Sig Start Date End Date Taking? Authorizing Provider  aspirin EC 81 MG tablet Take 1 tablet (81 mg total) by mouth daily. Patient taking differently: Take 81 mg by mouth once.  06/26/14  Yes McGowen, Adrian Blackwater, MD  ibuprofen (ADVIL) 200 MG tablet Take 400 mg by mouth every 6 (six) hours as needed for moderate pain.   Yes [provider]  omeprazole (PRILOSEC) 20 MG capsule Take 1 capsule (20 mg total) by mouth daily. 10/31/15  Yes McGowen, Adrian Blackwater, MD  OVER THE COUNTER MEDICATION Take 1 capsule by mouth 2 (two) times daily. Super Beta Prostate -   Yes [provider]  pravastatin (PRAVACHOL) 40 MG tablet TAKE 1 TABLET BY MOUTH EVERY DAY 08/09/18  Yes McGowen, Adrian Blackwater, MD    Allergies:  Allergies  Allergen  Reactions  . Sulfonamide Derivatives     REACTION: rash Because of a history of documented adverse serious drug reaction;Medi Alert bracelet  is recommended    Social History   Socioeconomic History  . Marital status: Married    Spouse name: Not on file  . Number of children: 1  . Years of education: Not on file  . Highest education level: Not on file  Occupational History  . Not on file  Social Needs  . Financial resource strain: Not on file  . Food insecurity:    Worry: Not on file    Inability: Not on file  . Transportation needs:    Medical: Not on file    Non-medical: Not on file  Tobacco Use  . Smoking status: Never Smoker  . Smokeless tobacco: Never Used  Substance and Sexual Activity  . Alcohol use: Yes     Alcohol/week: 1.0 standard drinks    Types: 1 Cans of beer per week    Comment:  2 beers/ month  . Drug use: No  . Sexual activity: Not on file  Lifestyle  . Physical activity:    Days per week: Not on file    Minutes per session: Not on file  . Stress: Not on file  Relationships  . Social connections:    Talks on phone: Not on file    Gets together: Not on file    Attends religious service: Not on file    Active member of club or organization: Not on file    Attends meetings of clubs or organizations: Not on file    Relationship status: Not on file  . Intimate partner violence:    Fear of current or ex partner: Not on file    Emotionally abused: Not on file    Physically abused: Not on file    Forced sexual activity: Not on file  Other Topics Concern  . Not on file  Social History Narrative   Married, 1 son.   Lives in [place].   Product/process development scientist.   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        Family History  Problem Relation Age of Onset  . Cancer Mother        Gynecologic  . COPD Mother   . Heart attack Father 47        CHF  . Heart attack Maternal Grandfather        > 55  . Heart attack Paternal Grandfather        >55  . Colon cancer Neg Hx   . Stomach cancer Neg Hx   . Diabetes Neg Hx   . Stroke Neg Hx     Review of Systems: All other systems reviewed and are otherwise negative except as noted above.  Labs:   Lab Results  Component Value Date   WBC 6.9 12/06/2018   HGB 14.7 12/06/2018   HCT 44.0 12/06/2018   MCV 92.4 12/06/2018   PLT 213 12/06/2018    Recent Labs  Lab 12/06/18 1628  NA 139  K 3.6  CL 110  CO2 22  BUN 18  CREATININE 0.90  CALCIUM 8.5*  GLUCOSE 154*   Recent Labs    12/06/18 1628  TROPONINI 0.19*   Lab Results  Component Value Date   CHOL 195 07/14/2018   HDL 42.50 07/14/2018   LDLCALC 124 (H) 07/08/2017   TRIG 278.0 (H) 07/14/2018   No results found for: DDIMER  Radiology/Studies:  Dg  Chest 2 View   Result Date: 12/06/2018 CLINICAL DATA:  Intermittent chest pain for 2 weeks EXAM: CHEST - 2 VIEW COMPARISON:  10/25/2007 FINDINGS: Normal heart size, mediastinal contours, and pulmonary vascularity. Mild RIGHT basilar atelectasis. Lungs otherwise clear. No infiltrate, pleural effusion or pneumothorax. Bones unremarkable. IMPRESSION: Mild RIGHT basilar atelectasis. Electronically Signed   By: Lavonia Dana M.D.   On: 12/06/2018 16:01   Wt Readings from Last 3 Encounters:  12/06/18 100.6 kg  07/14/18 103.5 kg  07/08/17 101.9 kg    EKG: NSR, inferolateral ST depression.  Physical Exam: Blood pressure (!) 151/95, pulse 72, temperature 97.8 F (36.6 C), temperature source Oral, resp. rate 20, height 5\' 11"  (1.803 m), weight 100.6 kg, SpO2 98 %. Body mass index is 30.93 kg/m. General: Well developed, well nourished, in no acute distress. Head: Normocephalic, atraumatic, sclera non-icteric, no xanthomas, nares are without discharge.  Neck: Negative for carotid bruits. JVD not elevated. Lungs: Clear bilaterally to auscultation without wheezes, rales, or rhonchi. Breathing is unlabored. Heart: RRR with S1 S2. No murmurs, rubs, or gallops appreciated. Abdomen: Soft, non-tender, non-distended with normoactive bowel sounds. No hepatomegaly. No rebound/guarding. No obvious abdominal masses. Msk:  Strength and tone appear normal for age. Extremities: No clubbing or cyanosis. No edema.  Distal pedal pulses are 2+ and equal bilaterally. Neuro: Alert and oriented X 3. No focal deficit. No facial asymmetry. Moves all extremities spontaneously. Psych:  Responds to questions appropriately with a normal affect.    Assessment and Plan  73M who p/w CP, found to have NSTEMI.  1.  NSTEMI:  CP free currently.  Continue IV heparin gtt; anticipate cardiac catheterization in the AM.  Echocardiogram ordered.  Start low dose lisinopril and metoprolol succinate.  2.  HLD: On pravastatin at home, most recent LDL 116.   Will replace with high intensity atorvastatin.  3.  GERD: Continue home omeprazole.  4.  OSA:  Was previously using CPAP at home, but not currently due to difficulties with fit.  Will monitor overnight oximetry and consider resumption of CPAP therapy as outpt.  Signed, Doylene Canning, MD 12/06/2018, 9:36 PM

## 2018-12-07 ENCOUNTER — Observation Stay (HOSPITAL_BASED_OUTPATIENT_CLINIC_OR_DEPARTMENT_OTHER): Payer: Medicare HMO

## 2018-12-07 ENCOUNTER — Observation Stay (HOSPITAL_COMMUNITY): Payer: Medicare HMO

## 2018-12-07 ENCOUNTER — Encounter (HOSPITAL_COMMUNITY): Payer: Self-pay | Admitting: Interventional Cardiology

## 2018-12-07 ENCOUNTER — Encounter (HOSPITAL_COMMUNITY)
Admission: EM | Disposition: A | Discharge status: Home / Self Care | Payer: Self-pay | Source: Home / Self Care | Attending: Cardiothoracic Surgery

## 2018-12-07 DIAGNOSIS — G8929 Other chronic pain: Secondary | ICD-10-CM | POA: Diagnosis present

## 2018-12-07 DIAGNOSIS — E669 Obesity, unspecified: Secondary | ICD-10-CM | POA: Diagnosis present

## 2018-12-07 DIAGNOSIS — M25561 Pain in right knee: Secondary | ICD-10-CM | POA: Diagnosis present

## 2018-12-07 DIAGNOSIS — K219 Gastro-esophageal reflux disease without esophagitis: Secondary | ICD-10-CM | POA: Diagnosis present

## 2018-12-07 DIAGNOSIS — Z01810 Encounter for preprocedural cardiovascular examination: Secondary | ICD-10-CM | POA: Diagnosis not present

## 2018-12-07 DIAGNOSIS — I2510 Atherosclerotic heart disease of native coronary artery without angina pectoris: Secondary | ICD-10-CM | POA: Diagnosis present

## 2018-12-07 DIAGNOSIS — I25110 Atherosclerotic heart disease of native coronary artery with unstable angina pectoris: Secondary | ICD-10-CM | POA: Diagnosis present

## 2018-12-07 DIAGNOSIS — J9811 Atelectasis: Secondary | ICD-10-CM | POA: Diagnosis not present

## 2018-12-07 DIAGNOSIS — N529 Male erectile dysfunction, unspecified: Secondary | ICD-10-CM | POA: Diagnosis present

## 2018-12-07 DIAGNOSIS — D62 Acute posthemorrhagic anemia: Secondary | ICD-10-CM | POA: Diagnosis not present

## 2018-12-07 DIAGNOSIS — I214 Non-ST elevation (NSTEMI) myocardial infarction: Principal | ICD-10-CM

## 2018-12-07 DIAGNOSIS — I4891 Unspecified atrial fibrillation: Secondary | ICD-10-CM | POA: Diagnosis not present

## 2018-12-07 DIAGNOSIS — E785 Hyperlipidemia, unspecified: Secondary | ICD-10-CM

## 2018-12-07 DIAGNOSIS — I340 Nonrheumatic mitral (valve) insufficiency: Secondary | ICD-10-CM | POA: Diagnosis not present

## 2018-12-07 DIAGNOSIS — G4733 Obstructive sleep apnea (adult) (pediatric): Secondary | ICD-10-CM | POA: Diagnosis present

## 2018-12-07 DIAGNOSIS — R0789 Other chest pain: Secondary | ICD-10-CM | POA: Diagnosis present

## 2018-12-07 DIAGNOSIS — M25562 Pain in left knee: Secondary | ICD-10-CM | POA: Diagnosis present

## 2018-12-07 DIAGNOSIS — Z882 Allergy status to sulfonamides status: Secondary | ICD-10-CM | POA: Diagnosis not present

## 2018-12-07 DIAGNOSIS — Z8249 Family history of ischemic heart disease and other diseases of the circulatory system: Secondary | ICD-10-CM | POA: Diagnosis not present

## 2018-12-07 DIAGNOSIS — Z86010 Personal history of colonic polyps: Secondary | ICD-10-CM | POA: Diagnosis not present

## 2018-12-07 DIAGNOSIS — E291 Testicular hypofunction: Secondary | ICD-10-CM | POA: Diagnosis present

## 2018-12-07 DIAGNOSIS — N400 Enlarged prostate without lower urinary tract symptoms: Secondary | ICD-10-CM | POA: Diagnosis present

## 2018-12-07 DIAGNOSIS — M170 Bilateral primary osteoarthritis of knee: Secondary | ICD-10-CM | POA: Diagnosis present

## 2018-12-07 DIAGNOSIS — Z7982 Long term (current) use of aspirin: Secondary | ICD-10-CM | POA: Diagnosis not present

## 2018-12-07 DIAGNOSIS — D696 Thrombocytopenia, unspecified: Secondary | ICD-10-CM | POA: Diagnosis not present

## 2018-12-07 DIAGNOSIS — Z6830 Body mass index (BMI) 30.0-30.9, adult: Secondary | ICD-10-CM | POA: Diagnosis not present

## 2018-12-07 DIAGNOSIS — E8770 Fluid overload, unspecified: Secondary | ICD-10-CM | POA: Diagnosis not present

## 2018-12-07 DIAGNOSIS — E8881 Metabolic syndrome: Secondary | ICD-10-CM | POA: Diagnosis present

## 2018-12-07 HISTORY — PX: TRANSTHORACIC ECHOCARDIOGRAM: SHX275

## 2018-12-07 HISTORY — DX: Atherosclerotic heart disease of native coronary artery without angina pectoris: I25.10

## 2018-12-07 HISTORY — PX: LEFT HEART CATH AND CORONARY ANGIOGRAPHY: CATH118249

## 2018-12-07 LAB — HEMOGLOBIN A1C
Hgb A1c MFr Bld: 5.5 % (ref 4.8–5.6)
Mean Plasma Glucose: 111.15 mg/dL

## 2018-12-07 LAB — CBC
HCT: 41.3 % (ref 39.0–52.0)
Hemoglobin: 14.5 g/dL (ref 13.0–17.0)
MCH: 31.2 pg (ref 26.0–34.0)
MCHC: 35.1 g/dL (ref 30.0–36.0)
MCV: 88.8 fL (ref 80.0–100.0)
Platelets: 206 10*3/uL (ref 150–400)
RBC: 4.65 MIL/uL (ref 4.22–5.81)
RDW: 12.7 % (ref 11.5–15.5)
WBC: 8 10*3/uL (ref 4.0–10.5)
nRBC: 0 % (ref 0.0–0.2)

## 2018-12-07 LAB — BASIC METABOLIC PANEL
Anion gap: 9 (ref 5–15)
BUN: 13 mg/dL (ref 8–23)
CO2: 23 mmol/L (ref 22–32)
Calcium: 8.7 mg/dL — ABNORMAL LOW (ref 8.9–10.3)
Chloride: 107 mmol/L (ref 98–111)
Creatinine, Ser: 1.04 mg/dL (ref 0.61–1.24)
GFR calc Af Amer: >60 mL/min (ref >60)
GFR calc non Af Amer: >60 mL/min (ref >60)
Glucose, Bld: 98 mg/dL (ref 70–99)
Potassium: 4.1 mmol/L (ref 3.5–5.1)
Sodium: 139 mmol/L (ref 135–145)

## 2018-12-07 LAB — LIPID PANEL
Cholesterol: 196 mg/dL (ref 0–200)
HDL: 45 mg/dL (ref >40)
LDL Cholesterol: 115 mg/dL — ABNORMAL HIGH (ref 0–99)
Total CHOL/HDL Ratio: 4.4 RATIO
Triglycerides: 182 mg/dL — ABNORMAL HIGH (ref <150)
VLDL: 36 mg/dL (ref 0–40)

## 2018-12-07 LAB — COMPREHENSIVE METABOLIC PANEL
ALT: 30 U/L (ref 0–44)
AST: 27 U/L (ref 15–41)
Albumin: 3.5 g/dL (ref 3.5–5.0)
Alkaline Phosphatase: 75 U/L (ref 38–126)
Anion gap: 5 (ref 5–15)
BUN: 12 mg/dL (ref 8–23)
CO2: 25 mmol/L (ref 22–32)
Calcium: 8.7 mg/dL — ABNORMAL LOW (ref 8.9–10.3)
Chloride: 107 mmol/L (ref 98–111)
Creatinine, Ser: 0.99 mg/dL (ref 0.61–1.24)
GFR calc Af Amer: >60 mL/min (ref >60)
GFR calc non Af Amer: >60 mL/min (ref >60)
Glucose, Bld: 117 mg/dL — ABNORMAL HIGH (ref 70–99)
Potassium: 3.5 mmol/L (ref 3.5–5.1)
Sodium: 137 mmol/L (ref 135–145)
Total Bilirubin: 0.6 mg/dL (ref 0.3–1.2)
Total Protein: 5.8 g/dL — ABNORMAL LOW (ref 6.5–8.1)

## 2018-12-07 LAB — URINALYSIS, ROUTINE W REFLEX MICROSCOPIC
Bilirubin Urine: NEGATIVE
Glucose, UA: NEGATIVE mg/dL
Hgb urine dipstick: NEGATIVE
Ketones, ur: NEGATIVE mg/dL
Leukocytes,Ua: NEGATIVE
Nitrite: NEGATIVE
Protein, ur: NEGATIVE mg/dL
Specific Gravity, Urine: 1.017 (ref 1.005–1.030)
pH: 7 (ref 5.0–8.0)

## 2018-12-07 LAB — ECHOCARDIOGRAM COMPLETE
Height: 71 in
Weight: 3536 oz

## 2018-12-07 LAB — GLUCOSE, CAPILLARY: Glucose-Capillary: 115 mg/dL — ABNORMAL HIGH (ref 70–99)

## 2018-12-07 LAB — TYPE AND SCREEN
ABO/RH(D): A POS
Antibody Screen: NEGATIVE

## 2018-12-07 LAB — SURGICAL PCR SCREEN
MRSA, PCR: NEGATIVE
Staphylococcus aureus: POSITIVE — AB

## 2018-12-07 LAB — HIV ANTIBODY (ROUTINE TESTING W REFLEX): HIV Screen 4th Generation wRfx: NONREACTIVE

## 2018-12-07 LAB — ABO/RH: ABO/RH(D): A POS

## 2018-12-07 LAB — HEPARIN LEVEL (UNFRACTIONATED): Heparin Unfractionated: 0.22 IU/mL — ABNORMAL LOW (ref 0.30–0.70)

## 2018-12-07 LAB — TROPONIN I: Troponin I: 0.44 ng/mL (ref <0.03)

## 2018-12-07 SURGERY — LEFT HEART CATH AND CORONARY ANGIOGRAPHY
Anesthesia: LOCAL

## 2018-12-07 MED ORDER — ASPIRIN 81 MG PO CHEW
81.0000 mg | CHEWABLE_TABLET | ORAL | Status: AC
  Administered 2018-12-07: 81 mg via ORAL
  Filled 2018-12-07: qty 1

## 2018-12-07 MED ORDER — TRANEXAMIC ACID (OHS) BOLUS VIA INFUSION
15.0000 mg/kg | INTRAVENOUS | Status: AC
  Administered 2018-12-08: 1503 mg via INTRAVENOUS
  Filled 2018-12-07: qty 1503

## 2018-12-07 MED ORDER — LIDOCAINE HCL (PF) 1 % IJ SOLN
INTRAMUSCULAR | Status: DC | PRN
  Administered 2018-12-07: 2 mL

## 2018-12-07 MED ORDER — SODIUM CHLORIDE 0.9% FLUSH: 3.0000 mL | INTRAVENOUS | Status: DC | PRN

## 2018-12-07 MED ORDER — METOPROLOL TARTRATE 12.5 MG HALF TABLET
12.5000 mg | ORAL_TABLET | Freq: Once | ORAL | Status: AC
  Administered 2018-12-08: 12.5 mg via ORAL
  Filled 2018-12-07: qty 1

## 2018-12-07 MED ORDER — VERAPAMIL HCL 2.5 MG/ML IV SOLN
INTRAVENOUS | Status: AC
  Filled 2018-12-07: qty 2

## 2018-12-07 MED ORDER — TEMAZEPAM 15 MG PO CAPS: 15.0000 mg | ORAL_CAPSULE | Freq: Once | ORAL | Status: DC | PRN

## 2018-12-07 MED ORDER — CHLORHEXIDINE GLUCONATE CLOTH 2 % EX PADS
6.0000 | MEDICATED_PAD | Freq: Once | CUTANEOUS | Status: AC
  Administered 2018-12-07: 6 via TOPICAL

## 2018-12-07 MED ORDER — PLASMA-LYTE 148 IV SOLN
INTRAVENOUS | Status: AC
  Administered 2018-12-08: 10:00:00
  Filled 2018-12-07: qty 2.5

## 2018-12-07 MED ORDER — ACETAMINOPHEN 325 MG PO TABS: 650.0000 mg | ORAL_TABLET | ORAL | Status: DC | PRN

## 2018-12-07 MED ORDER — SODIUM CHLORIDE 0.9% FLUSH
3.0000 mL | Freq: Two times a day (BID) | INTRAVENOUS | Status: DC
  Administered 2018-12-07: 3 mL via INTRAVENOUS

## 2018-12-07 MED ORDER — MAGNESIUM SULFATE 50 % IJ SOLN
40.0000 meq | INTRAMUSCULAR | Status: DC
  Filled 2018-12-07: qty 9.85

## 2018-12-07 MED ORDER — PHENYLEPHRINE HCL-NACL 20-0.9 MG/250ML-% IV SOLN
30.0000 ug/min | INTRAVENOUS | Status: DC
  Filled 2018-12-07: qty 250

## 2018-12-07 MED ORDER — INSULIN REGULAR(HUMAN) IN NACL 100-0.9 UT/100ML-% IV SOLN
INTRAVENOUS | Status: AC
  Administered 2018-12-08: .8 [IU]/h via INTRAVENOUS
  Filled 2018-12-07: qty 100

## 2018-12-07 MED ORDER — IOHEXOL 350 MG/ML SOLN
INTRAVENOUS | Status: DC | PRN
  Administered 2018-12-07: 70 mL via INTRA_ARTERIAL

## 2018-12-07 MED ORDER — POTASSIUM CHLORIDE 2 MEQ/ML IV SOLN
80.0000 meq | INTRAVENOUS | Status: DC
  Filled 2018-12-07: qty 40

## 2018-12-07 MED ORDER — FENTANYL CITRATE (PF) 100 MCG/2ML IJ SOLN
INTRAMUSCULAR | Status: DC | PRN
  Administered 2018-12-07: 25 ug via INTRAVENOUS

## 2018-12-07 MED ORDER — VERAPAMIL HCL 2.5 MG/ML IV SOLN
INTRAVENOUS | Status: DC | PRN
  Administered 2018-12-07: 10 mL via INTRA_ARTERIAL

## 2018-12-07 MED ORDER — MILRINONE LACTATE IN DEXTROSE 20-5 MG/100ML-% IV SOLN
0.3000 ug/kg/min | INTRAVENOUS | Status: DC
  Filled 2018-12-07: qty 100

## 2018-12-07 MED ORDER — HEPARIN (PORCINE) IN NACL 1000-0.9 UT/500ML-% IV SOLN
INTRAVENOUS | Status: DC | PRN
  Administered 2018-12-07 (×2): 500 mL

## 2018-12-07 MED ORDER — LIDOCAINE HCL (PF) 1 % IJ SOLN
INTRAMUSCULAR | Status: AC
  Filled 2018-12-07: qty 30

## 2018-12-07 MED ORDER — SODIUM CHLORIDE 0.9 % IV SOLN
INTRAVENOUS | Status: DC
  Filled 2018-12-07: qty 30

## 2018-12-07 MED ORDER — BISACODYL 5 MG PO TBEC: 5.0000 mg | DELAYED_RELEASE_TABLET | Freq: Once | ORAL | Status: DC

## 2018-12-07 MED ORDER — EPINEPHRINE PF 1 MG/ML IJ SOLN
0.0000 ug/min | INTRAVENOUS | Status: DC
  Filled 2018-12-07: qty 4

## 2018-12-07 MED ORDER — HEPARIN BOLUS VIA INFUSION
2000.0000 [IU] | Freq: Once | INTRAVENOUS | Status: AC
  Administered 2018-12-07: 2000 [IU] via INTRAVENOUS
  Filled 2018-12-07: qty 2000

## 2018-12-07 MED ORDER — SODIUM CHLORIDE 0.9 % IV SOLN: 250.0000 mL | INTRAVENOUS | Status: DC | PRN

## 2018-12-07 MED ORDER — NITROGLYCERIN IN D5W 200-5 MCG/ML-% IV SOLN
2.0000 ug/min | INTRAVENOUS | Status: DC
  Filled 2018-12-07: qty 250

## 2018-12-07 MED ORDER — DOPAMINE-DEXTROSE 3.2-5 MG/ML-% IV SOLN
0.0000 ug/kg/min | INTRAVENOUS | Status: DC
  Filled 2018-12-07: qty 250

## 2018-12-07 MED ORDER — MIDAZOLAM HCL 2 MG/2ML IJ SOLN
INTRAMUSCULAR | Status: DC | PRN
  Administered 2018-12-07: 2 mg via INTRAVENOUS

## 2018-12-07 MED ORDER — SODIUM CHLORIDE 0.9 % IV SOLN
750.0000 mg | INTRAVENOUS | Status: DC
  Filled 2018-12-07: qty 750

## 2018-12-07 MED ORDER — VANCOMYCIN HCL 10 G IV SOLR
1500.0000 mg | INTRAVENOUS | Status: AC
  Administered 2018-12-08: 1500 mg via INTRAVENOUS
  Filled 2018-12-07: qty 1500

## 2018-12-07 MED ORDER — ONDANSETRON HCL 4 MG/2ML IJ SOLN: 4.0000 mg | Freq: Four times a day (QID) | INTRAMUSCULAR | Status: DC | PRN

## 2018-12-07 MED ORDER — TRANEXAMIC ACID (OHS) PUMP PRIME SOLUTION
2.0000 mg/kg | INTRAVENOUS | Status: DC
  Filled 2018-12-07: qty 2

## 2018-12-07 MED ORDER — SODIUM CHLORIDE 0.9 % WEIGHT BASED INFUSION
1.0000 mL/kg/h | INTRAVENOUS | Status: DC
  Administered 2018-12-07: 1 mL/kg/h via INTRAVENOUS

## 2018-12-07 MED ORDER — MUPIROCIN 2 % EX OINT
1.0000 "application " | TOPICAL_OINTMENT | Freq: Two times a day (BID) | CUTANEOUS | Status: AC
  Administered 2018-12-07 – 2018-12-11 (×9): 1 via NASAL
  Filled 2018-12-07 (×2): qty 22

## 2018-12-07 MED ORDER — CHLORHEXIDINE GLUCONATE 0.12 % MT SOLN
15.0000 mL | Freq: Once | OROMUCOSAL | Status: AC
  Administered 2018-12-08: 05:00:00 15 mL via OROMUCOSAL
  Filled 2018-12-07: qty 15

## 2018-12-07 MED ORDER — LABETALOL HCL 5 MG/ML IV SOLN: 10.0000 mg | INTRAVENOUS | Status: AC | PRN

## 2018-12-07 MED ORDER — TRANEXAMIC ACID 1000 MG/10ML IV SOLN
1.5000 mg/kg/h | INTRAVENOUS | Status: AC
  Administered 2018-12-08: 1.5 mg/kg/h via INTRAVENOUS
  Filled 2018-12-07: qty 25

## 2018-12-07 MED ORDER — HEPARIN (PORCINE) IN NACL 1000-0.9 UT/500ML-% IV SOLN
INTRAVENOUS | Status: AC
  Filled 2018-12-07: qty 1000

## 2018-12-07 MED ORDER — HEPARIN SODIUM (PORCINE) 1000 UNIT/ML IJ SOLN
INTRAMUSCULAR | Status: DC | PRN
  Administered 2018-12-07: 5000 [IU] via INTRAVENOUS

## 2018-12-07 MED ORDER — SODIUM CHLORIDE 0.9 % IV SOLN
1.5000 g | INTRAVENOUS | Status: AC
  Administered 2018-12-08: .75 g via INTRAVENOUS
  Administered 2018-12-08: 1.5 g via INTRAVENOUS
  Filled 2018-12-07 (×2): qty 1.5

## 2018-12-07 MED ORDER — CHLORHEXIDINE GLUCONATE CLOTH 2 % EX PADS
6.0000 | MEDICATED_PAD | Freq: Once | CUTANEOUS | Status: AC
  Administered 2018-12-08: 05:00:00 6 via TOPICAL

## 2018-12-07 MED ORDER — NOREPINEPHRINE 4 MG/250ML-% IV SOLN
0.0000 ug/min | INTRAVENOUS | Status: DC
  Filled 2018-12-07: qty 250

## 2018-12-07 MED ORDER — DEXMEDETOMIDINE HCL IN NACL 400 MCG/100ML IV SOLN
0.1000 ug/kg/h | INTRAVENOUS | Status: DC
  Filled 2018-12-07: qty 100

## 2018-12-07 MED ORDER — HYDRALAZINE HCL 20 MG/ML IJ SOLN: 10.0000 mg | INTRAMUSCULAR | Status: AC | PRN

## 2018-12-07 MED ORDER — FENTANYL CITRATE (PF) 100 MCG/2ML IJ SOLN
INTRAMUSCULAR | Status: AC
  Filled 2018-12-07: qty 2

## 2018-12-07 MED ORDER — SODIUM CHLORIDE 0.9 % IV SOLN: INTRAVENOUS | Status: AC

## 2018-12-07 MED ORDER — MIDAZOLAM HCL 2 MG/2ML IJ SOLN
INTRAMUSCULAR | Status: AC
  Filled 2018-12-07: qty 2

## 2018-12-07 MED ORDER — SODIUM CHLORIDE 0.9 % WEIGHT BASED INFUSION
3.0000 mL/kg/h | INTRAVENOUS | Status: DC
  Administered 2018-12-07: 3 mL/kg/h via INTRAVENOUS

## 2018-12-07 MED ORDER — HEPARIN (PORCINE) 25000 UT/250ML-% IV SOLN
1400.0000 [IU]/h | INTRAVENOUS | Status: DC
  Filled 2018-12-07: qty 250

## 2018-12-07 SURGICAL SUPPLY — 10 items
CATH 5FR JL3.5 JR4 ANG PIG MP (CATHETERS) ×2 IMPLANT
DEVICE RAD COMP TR BAND LRG (VASCULAR PRODUCTS) ×2 IMPLANT
GLIDESHEATH SLEND SS 6F .021 (SHEATH) ×2 IMPLANT
GUIDEWIRE INQWIRE 1.5J.035X260 (WIRE) ×1 IMPLANT
INQWIRE 1.5J .035X260CM (WIRE) ×2
KIT HEART LEFT (KITS) ×2 IMPLANT
PACK CARDIAC CATHETERIZATION (CUSTOM PROCEDURE TRAY) ×2 IMPLANT
SHEATH PROBE COVER 6X72 (BAG) ×2 IMPLANT
TRANSDUCER W/STOPCOCK (MISCELLANEOUS) ×2 IMPLANT
TUBING CIL FLEX 10 FLL-RA (TUBING) ×2 IMPLANT

## 2018-12-07 NOTE — Progress Notes (Signed)
Progress Note  Patient Name: ARNEY MAYABB Date of Encounter: 12/07/2018  Primary Cardiologist: Glenetta Hew, MD    Subjective   No chest pain overnight. Planned for cardiac cath today.  No further pain since arrival to the emergency room.  Inpatient Medications    Scheduled Meds: . aspirin EC  81 mg Oral Daily  . atorvastatin  80 mg Oral q1800  . lisinopril  5 mg Oral Daily  . metoprolol succinate  25 mg Oral Daily  . pantoprazole  40 mg Oral Daily  . sodium chloride flush  3 mL Intravenous Q12H   Continuous Infusions: . sodium chloride    . sodium chloride    . heparin 1,400 Units/hr (12/07/18 0141)   PRN Meds: sodium chloride, acetaminophen, nitroGLYCERIN, ondansetron (ZOFRAN) IV, sodium chloride flush   Vital Signs    Vitals:   12/06/18 1946 12/06/18 2021 12/06/18 2032 12/07/18 0523  BP: (!) 159/84 (!) 151/95  138/89  Pulse: 73 72  68  Resp: 17 20  13   Temp:  97.8 F (36.6 C)  97.9 F (36.6 C)  TempSrc:  Oral  Oral  SpO2: 99% 98%  95%  Weight:  100.6 kg 100.6 kg 100.2 kg  Height:  5\' 11"  (1.803 m) 5\' 11"  (1.803 m)     Intake/Output Summary (Last 24 hours) at 12/07/2018 0831 Last data filed at 12/07/2018 0600 Gross per 24 hour  Intake 432.3 ml  Output 400 ml  Net 32.3 ml   Last 3 Weights 12/07/2018 12/06/2018 12/06/2018  Weight (lbs) 221 lb 221 lb 12.5 oz 221 lb 12.5 oz  Weight (kg) 100.245 kg 100.6 kg 100.6 kg      Telemetry    NSR 60s-70s - Personally Reviewed  ECG    N/a (not yet done) - Personally Reviewed  Physical Exam   Physical Exam  Constitutional: He is oriented to person, place, and time. He appears well-developed and well-nourished. No distress.  HENT:  Head: Normocephalic and atraumatic.  Eyes: Pupils are equal, round, and reactive to light. Conjunctivae and EOM are normal.  Neck: Normal range of motion. Neck supple. No hepatojugular reflux and no JVD present. Carotid bruit is not present.  Cardiovascular: Normal rate,  regular rhythm, normal heart sounds, intact distal pulses and normal pulses.  No extrasystoles are present. PMI is not displaced. Exam reveals no gallop and no friction rub.  No murmur heard. Pulmonary/Chest: Effort normal and breath sounds normal. No respiratory distress. He has no wheezes. He has no rales.  Abdominal: Soft. Bowel sounds are normal.  Musculoskeletal: Normal range of motion.        General: No deformity or edema.  Neurological: He is alert and oriented to person, place, and time.  Skin: Skin is warm and dry. He is not diaphoretic.  Psychiatric: His behavior is normal. Judgment and thought content normal.  Nursing note and vitals reviewed.   Labs    Chemistry Recent Labs  Lab 12/06/18 1628 12/07/18 0319  NA 139 139  K 3.6 4.1  CL 110 107  CO2 22 23  GLUCOSE 154* 98  BUN 18 13  CREATININE 0.90 1.04  CALCIUM 8.5* 8.7*  GFRNONAA >60 >60  GFRAA >60 >60  ANIONGAP 7 9     Hematology Recent Labs  Lab 12/06/18 1628 12/07/18 0319  WBC 6.9 8.0  RBC 4.76 4.65  HGB 14.7 14.5  HCT 44.0 41.3  MCV 92.4 88.8  MCH 30.9 31.2  MCHC 33.4 35.1  RDW 12.5  12.7  PLT 213 206    Cardiac Enzymes Recent Labs  Lab 12/06/18 1628 12/07/18 0319  TROPONINI 0.19* 0.44*   No results for input(s): TROPIPOC in the last 168 hours.   BNPNo results for input(s): BNP, PROBNP in the last 168 hours.   DDimer No results for input(s): DDIMER in the last 168 hours.   Radiology    Dg Chest 2 View  Result Date: 12/06/2018 CLINICAL DATA:  Intermittent chest pain for 2 weeks EXAM: CHEST - 2 VIEW COMPARISON:  10/25/2007 FINDINGS: Normal heart size, mediastinal contours, and pulmonary vascularity. Mild RIGHT basilar atelectasis. Lungs otherwise clear. No infiltrate, pleural effusion or pneumothorax. Bones unremarkable. IMPRESSION: Mild RIGHT basilar atelectasis. Electronically Signed   By: Lavonia Dana M.D.   On: 12/06/2018 16:01    Cardiac Studies   TTE: pending  Patient Profile      68 y.o. male with PMH of HL, GERD, BPH who presented with intermittent chest pain and found to have elevated troponins -consistent with non-ST elevation MI.  Assessment & Plan    1. NSTEMI: Presented with onset of chest pain. Both exertional and rest symptoms for the past 2 weeks. Most recent was yesterday while sitting in his home office yesterday.  --> Symptoms concerning for ACS. Troponin up to 0.4 this morning.   Remains on IV heparin.   ASA, statin, BB, ACEi - BP stable  plan for cardiac cath today. The patient understands that risks included but are not limited to stroke (1 in 1000), death (1 in 68), kidney failure [usually temporary] (1 in 500), bleeding (1 in 200), allergic reaction [possibly serious] (1 in 200).   2. HL: on pravastatin PTA, switched to high dose atorvastatin on admission. LDL 115  3. GERD: on protonix  4. OSA: was compliant with Cpap prior to admission but recently not using 2/2 to issues with fitting. Will need to readdress at some point as an outpatient. -- needs to re-establish with Sleep Med MD.   For questions or updates, please contact [facility] Please consult [email or web address] for contact info under     Signed, Glenetta Hew, MD  12/07/2018, 8:31 AM    ATTENDING ATTESTATION  I have seen, examined and evaluated the patient this AM along with Reino Bellis, NP-C.  After reviewing all the available data and chart, we discussed the patients laboratory, study & physical findings as well as symptoms in detail. I agree with her findings, examination as well as impression recommendations as per our discussion.    Attending adjustments noted in italics.  Doing well stable after mild non-STEMI.  Plan for cardiac catheterization today.  Remains on heparin. Converted to high-dose statin, added low-dose beta-blocker to his ACE inhibitor -can titrate pending blood pressure follow-up  More plans based on cath results.  Anticipate discharge in the morning  if PCI.   Glenetta Hew, M.D., M.S. Interventional Cardiologist   Pager # [phone] Phone # [phone] [address] [place], [place] [address]

## 2018-12-07 NOTE — Progress Notes (Signed)
PRE CABG has been completed.   Preliminary results in CV Proc.   Abram Sander 12/07/2018 2:35 PM

## 2018-12-07 NOTE — Progress Notes (Signed)
[place] for Heparin Indication: chest pain/ACS  Allergies  Allergen Reactions  . Sulfonamide Derivatives     REACTION: rash Because of a history of documented adverse serious drug reaction;Medi Alert bracelet  is recommended    Patient Measurements: Height: 5\' 11"  (180.3 cm) Weight: 221 lb 12.5 oz (100.6 kg) IBW/kg (Calculated) : 75.3 Heparin Dosing Weight: 96.5 kg  Vital Signs: Temp: 97.8 F (36.6 C) (03/30 2021) Temp Source: Oral (03/30 2021) BP: 151/95 (03/30 2021) Pulse Rate: 72 (03/30 2021)  Labs: Recent Labs    12/06/18 1628 12/07/18 0045  HGB 14.7  --   HCT 44.0  --   PLT 213  --   APTT 28  --   LABPROT 12.5  --   INR 0.9  --   HEPARINUNFRC  --  0.22*  CREATININE 0.90  --   TROPONINI 0.19*  --     Estimated Creatinine Clearance: 98.8 mL/min (by C-G formula based on SCr of 0.9 mg/dL).   Medical History: Past Medical History:  Diagnosis Date  . BPH (benign prostatic hypertrophy)    Flomax helpful 2018/19  . Chronic pain of right knee    Left knee as well (osteoarthritis).  Dr. Durward Fortes  . Colon polyps 2013   NON-adenomatous 2013---recall 10 yrs.  . Elevated PSA 07/10/2016   Dr. Gaynelle Arabian saw him 07/15/16, did f/u PSA and it was 2.78: f/u was recommended but pt did not do this as of 07/2017.  Repeat PSA here 07/2017 was 3.1, with 19% free (free a little low).  Pt then followed up with Dr. Gaynelle Arabian and tx for BPH w/plan of repeat PSA 1 yr.  PSA 07/2018 down to 3.21.  [name] Erectile dysfunction    Urol rx'd sildenafil 20mg  tabs 11/2017.  [name] Fatigue    + excessive daytime somnolence  . GERD (gastroesophageal reflux disease)   . Hallux limitus 04/2016   1st MPJ joint R foot.  Diclofenac helpful.  Injected by podiatrist 11/2016.  [name] Hyperlipidemia   . Hypogonadism male   . IFG (impaired fasting glucose)    A1c 5.8% 2014   Assessment: 41 yoM [facility] with chest pain.  Pharmacy is consulted to dose  Heparin.No PTA anticoagulation noted.  CBC: Hgb 14.7, Plt 213 SCr 0.9, CrCl ~ 100 ml/min Trop: 0.19  3/31 AM update: initial heparin level below goal, no issues per RN.  Goal of Therapy:  Heparin level 0.3-0.7 units/ml Monitor platelets by anticoagulation protocol: Yes   Plan:  Heparin 2000 units re-bolus Inc heparin drip to 1400 units/hr Re-check heparin level in 8 hours  Narda Bonds, PharmD, [place] Pharmacist Phone: [phone]

## 2018-12-07 NOTE — Progress Notes (Signed)
CRITICAL VALUE ALERT  Critical Value:  Troponin 0.44  Date & Time Notied:  12/07/2018 0434  Provider Notified: Dr. Fontana Lions  Orders Received/Actions taken: per protocol

## 2018-12-07 NOTE — Anesthesia Preprocedure Evaluation (Addendum)
Anesthesia Evaluation  Patient identified by MRN, date of birth, ID band Patient awake    Reviewed: Allergy & Precautions, NPO status , Patient's Chart, lab work & pertinent test results, reviewed documented beta blocker date and time   Airway Mallampati: III  TM Distance: <3 FB Neck ROM: Full    Dental  (+) Dental Advisory Given   Pulmonary sleep apnea ,    breath sounds clear to auscultation       Cardiovascular + angina with exertion and at rest + CAD and + Past MI   Rhythm:Regular Rate:Normal     Neuro/Psych  Neuromuscular disease    GI/Hepatic Neg liver ROS, GERD  ,  Endo/Other  negative endocrine ROS  Renal/GU negative Renal ROS     Musculoskeletal   Abdominal   Peds  Hematology negative hematology ROS (+)   Anesthesia Other Findings   Reproductive/Obstetrics                            Lab Results  Component Value Date   WBC 8.4 12/08/2018   HGB 14.9 12/08/2018   HCT 43.8 12/08/2018   MCV 90.9 12/08/2018   PLT 217 12/08/2018   Lab Results  Component Value Date   CREATININE 1.04 12/08/2018   BUN 14 12/08/2018   NA 139 12/08/2018   K 4.3 12/08/2018   CL 110 12/08/2018   CO2 21 (L) 12/08/2018    Anesthesia Physical Anesthesia Plan  ASA: IV  Anesthesia Plan: General   Post-op Pain Management:    Induction: Intravenous  PONV Risk Score and Plan: 2 and Dexamethasone, Ondansetron and Treatment may vary due to age or medical condition  Airway Management Planned: Oral ETT  Additional Equipment: Arterial line, CVP, PA Cath, TEE and Ultrasound Guidance Line Placement  Intra-op Plan:   Post-operative Plan: Post-operative intubation/ventilation  Informed Consent: I have reviewed the patients History and Physical, chart, labs and discussed the procedure including the risks, benefits and alternatives for the proposed anesthesia with the patient or authorized representative  who has indicated his/her understanding and acceptance.     Dental advisory given  Plan Discussed with: CRNA  Anesthesia Plan Comments:        Anesthesia Quick Evaluation

## 2018-12-07 NOTE — Progress Notes (Signed)
  Echocardiogram 2D Echocardiogram has been performed.  Tyler [name] 12/07/2018, 9:40 AM

## 2018-12-07 NOTE — Progress Notes (Signed)
[id number] Pre op done with pt who voiced understanding. Gave IS and pt able to demonstrate 2500 ml correctly. Reviewed sternal precautions and staying in the tube. Handout given. Gave OHS booklet and care guide. Pt stated he will have family available after discharge to assist with care. Discussed importance of IS and mobility after surgery.. will follow up after surgery. Graylon Good RN BSN 12/07/2018 1:36 PM

## 2018-12-07 NOTE — Interval H&P Note (Signed)
Cath Lab Visit (complete for each Cath Lab visit)  Clinical Evaluation Leading to the Procedure:   ACS: Yes.    Non-ACS:    Anginal Classification: CCS IV  Anti-ischemic medical therapy: Minimal Therapy (1 class of medications)  Non-Invasive Test Results: No non-invasive testing performed  Prior CABG: No previous CABG      History and Physical Interval Note:  12/07/2018 9:56 AM  Tyler [name]  has presented today for surgery, with the diagnosis of NSTEMi.  The various methods of treatment have been discussed with the patient and family. After consideration of risks, benefits and other options for treatment, the patient has consented to  Procedure(s): LEFT HEART CATH AND CORONARY ANGIOGRAPHY (N/A) as a surgical intervention.  The patient's history has been reviewed, patient examined, no change in status, stable for surgery.  I have reviewed the patient's chart and labs.  Questions were answered to the patient's satisfaction.     [name]

## 2018-12-07 NOTE — Consult Note (Addendum)
[address]       [place] [address]             [phone]        Hilton S Mah Holliday Medical Record #[id number] Date of Birth: July 01, 1951  Referring: No ref. provider found Primary Care: Tammi Sou, MD Primary Cardiologist:David Ellyn Hack, MD  Chief Complaint:    Chief Complaint  Patient presents with   Chest Pain  Non-ST segment elevation myocardial infarction  History of Present Illness: The patient is a 68 year old male who presented on 12/06/2018 to the emergency department with a chief complaint of chest pain.  The patient describes intermittent substernal chest discomfort radiating to both arms episodically for the past 2 weeks.  He would primarily in the right arm and when chest pain dissipated he would continue to have right bicep region pain for a couple minutes longer.  The has had both exertional and rest symptoms.  At the time of presentation he had an episode in which she was going to go for a walk but almost immediately had significant chest discomfort that stopped after resting approximately 5 minutes.  He had additional episodes and then presented to the emergency department for further evaluation and treatment.  His initial ECG showed mild inferolateral ST depression.  His initial troponin I was 0.19.  Peak troponin was 0.44.  He was started on intravenous heparin and admitted to [facility] for further evaluation and treatment to include cardiac catheterization.  Catheterization revealed significant left main as well as circumflex disease.  Please see the full report details below.  We are asked to see the patient in cardiothoracic surgical consultation to evaluate candidacy for coronary artery surgical revascularization.  The patient does have a cardiac risk factors including hyperlipidemia and erectile dysfunction.  He has been on Pravachol therapy.  His most recent lipid study done 12/07/2018 shows a poor triglyceride to HDL ratio with  an LDL of 115.  He has no history of tobacco abuse.  He does not have a history of diabetes but he states he has been "borderline" for a few years.[name]  He is obese with a BMI of 30.82. He is on CPAP for sleep apnea but says he has not used in approximately past 6 months due to discomfort related to the mask..    Current Activity/ Functional Status: Patient is independent with mobility/ambulation, transfers, ADL's, IADL's.   Zubrod Score: At the time of surgery this patients most appropriate activity status/level should be described as: []     0    Normal activity, no symptoms [x]     1    Restricted in physical strenuous activity but ambulatory, able to do out light work []     2    Ambulatory and capable of self care, unable to do work activities, up and about                 more than 50%  Of the time                            []     3    Only limited self care, in bed greater than 50% of waking hours []     4    Completely disabled, no self care, confined to bed or chair []     5    Moribund  Past Medical History:  Diagnosis Date   BPH (benign  prostatic hypertrophy)    Flomax helpful 2018/19   Chronic pain of right knee    Left knee as well (osteoarthritis).  Dr. Durward Fortes   Colon polyps 2013   NON-adenomatous 2013---recall 10 yrs.   Elevated PSA 07/10/2016   Dr. Gaynelle Arabian saw him 07/15/16, did f/u PSA and it was 2.78: f/u was recommended but pt did not do this as of 07/2017.  Repeat PSA here 07/2017 was 3.1, with 19% free (free a little low).  Pt then followed up with Dr. Gaynelle Arabian and tx for BPH w/plan of repeat PSA 1 yr.  PSA 07/2018 down to 3.21.   Erectile dysfunction    Urol rx'd sildenafil 20mg  tabs 11/2017.   Fatigue    + excessive daytime somnolence   GERD (gastroesophageal reflux disease)    Hallux limitus 04/2016   1st MPJ joint R foot.  Diclofenac helpful.  Injected by podiatrist 11/2016.   Hyperlipidemia    Hypogonadism male    IFG (impaired fasting glucose)     A1c 5.8% 2014    Past Surgical History:  Procedure Laterality Date   COLONOSCOPY  1998 & 2013    Dr [name]; 2 benign polyps 2013--recall 2023.   hydrocoelectomy     nocturnal polysomn  1990s   Sleep lab: no OSA (??)--Dr. [name] said he needs another sleep study as of 2013   [place]  2000 & 2011   dilation 2000    Social History   Tobacco Use  Smoking Status Never Smoker  Smokeless Tobacco Never Used    Social History   Substance and Sexual Activity  Alcohol Use Yes   Alcohol/week: 1.0 standard drinks   Types: 1 Cans of beer per week   Comment:  2 beers/ month     Allergies  Allergen Reactions   Sulfonamide Derivatives     REACTION: rash Because of a history of documented adverse serious drug reaction;Medi Alert bracelet  is recommended    Current Facility-Administered Medications  Medication Dose Route Frequency Provider Last Rate Last Dose   0.9 %  sodium chloride infusion  250 mL Intravenous PRN Jettie Booze, MD       0.9 %  sodium chloride infusion   Intravenous Continuous Jettie Booze, MD 75 mL/hr at 12/07/18 1114     0.9% sodium chloride infusion  1 mL/kg/hr Intravenous Continuous Jettie Booze, MD 100.2 mL/hr at 12/07/18 0929 1 mL/kg/hr at 12/07/18 0929   [MAR Hold] acetaminophen (TYLENOL) tablet 650 mg  650 mg Oral Q4H PRN Doylene Canning, MD       [MAR Hold] aspirin EC tablet 81 mg  81 mg Oral Daily Chakravartti, Jaidip, MD       [MAR Hold] atorvastatin (LIPITOR) tablet 80 mg  80 mg Oral q1800 Chakravartti, Jaidip, MD       heparin ADULT infusion 100 units/mL (25000 units/226mL sodium chloride 0.45%)  1,400 Units/hr Intravenous Continuous Erenest Blank, RPH   Stopped at 12/07/18 0935   [MAR Hold] lisinopril (PRINIVIL,ZESTRIL) tablet 5 mg  5 mg Oral Daily Chakravartti, Jaidip, MD   5 mg at 12/07/18 0839   [MAR Hold] metoprolol succinate  (TOPROL-XL) 24 hr tablet 25 mg  25 mg Oral Daily Chakravartti, Jaidip, MD   25 mg at 12/07/18 0839   [MAR Hold] nitroGLYCERIN (NITROSTAT) SL tablet 0.4 mg  0.4 mg Sublingual Q5 Min x 3 PRN Doylene Canning, MD       [  MAR Hold] ondansetron (ZOFRAN) injection 4 mg  4 mg Intravenous Q6H PRN Chakravartti, Jaidip, MD       [MAR Hold] pantoprazole (PROTONIX) EC tablet 40 mg  40 mg Oral Daily Chakravartti, Jaidip, MD   40 mg at 12/07/18 0749   sodium chloride flush (NS) 0.9 % injection 3 mL  3 mL Intravenous Q12H Jettie Booze, MD   3 mL at 12/07/18 0840   sodium chloride flush (NS) 0.9 % injection 3 mL  3 mL Intravenous PRN Jettie Booze, MD        Medications Prior to Admission  Medication Sig Dispense Refill Last Dose   aspirin EC 81 MG tablet Take 1 tablet (81 mg total) by mouth daily. (Patient taking differently: Take 81 mg by mouth once. ) 30 tablet 0 12/05/2018 at Unknown time   ibuprofen (ADVIL) 200 MG tablet Take 400 mg by mouth every 6 (six) hours as needed for moderate pain.   unknown   omeprazole (PRILOSEC) 20 MG capsule Take 1 capsule (20 mg total) by mouth daily. 30 capsule 11 12/06/2018 at Unknown time   OVER THE COUNTER MEDICATION Take 1 capsule by mouth 2 (two) times daily. Super Beta Prostate -   12/06/2018 at Unknown time   pravastatin (PRAVACHOL) 40 MG tablet TAKE 1 TABLET BY MOUTH EVERY DAY 90 tablet 3 12/05/2018 at Unknown time    Family History  Problem Relation Age of Onset   Cancer Mother        Gynecologic   COPD Mother    Heart attack Father 33        CHF   Heart attack Maternal Grandfather        > 70   Heart attack Paternal Grandfather        >55   Colon cancer Neg Hx    Stomach cancer Neg Hx    Diabetes Neg Hx    Stroke Neg Hx      Review of Systems:   Review of Systems  Constitutional: Negative for chills, diaphoresis, fever, malaise/fatigue and weight loss.  HENT: Positive for congestion and sore throat. Negative for ear  discharge, ear pain, hearing loss, nosebleeds, sinus pain and tinnitus.   Eyes: Negative for blurred vision, double vision, photophobia, pain, discharge and redness.  Respiratory: Negative for cough, hemoptysis, sputum production, shortness of breath, wheezing and stridor.        + cpap for sleep apnea, but hasn't worn in 6 months d/t discomfort  Cardiovascular: Positive for chest pain and palpitations. Negative for orthopnea, claudication, leg swelling and PND.  Gastrointestinal: Positive for heartburn and melena. Negative for abdominal pain, blood in stool, constipation, diarrhea, nausea and vomiting.  Genitourinary: Positive for frequency. Negative for dysuria, flank pain, hematuria and urgency.  Musculoskeletal: Positive for back pain and joint pain. Negative for falls, myalgias and neck pain.       Knee and elbow arthritis Lumbar- gets chiropractic manipulation  Skin: Negative.   Neurological: Negative for dizziness, tingling, tremors, sensory change, speech change, focal weakness, seizures, loss of consciousness, weakness and headaches.  Endo/Heme/Allergies: Positive for environmental allergies. Negative for polydipsia. Does not bruise/bleed easily.  Psychiatric/Behavioral: Negative for depression, hallucinations, memory loss, substance abuse and suicidal ideas. The patient is not nervous/anxious and does not have insomnia.         Physical Exam: BP 119/80    Pulse 64    Temp 97.9 F (36.6 C) (Oral)    Resp 15    Ht 5'  11" (1.803 m)    Wt 100.2 kg    SpO2 97%    BMI 30.82 kg/m    Physical Exam  Constitutional: He appears healthy. No distress.  HENT:  Nose: Nose normal. No nasal discharge.  Mouth/Throat: No dental caries. Oropharynx is clear. Pharynx is normal.  Eyes: Pupils are equal, round, and reactive to light. Conjunctivae are normal.  Neck: Normal range of motion and thyroid normal. Neck supple. No JVD present. No neck adenopathy. No thyromegaly present.  Cardiovascular:  Normal rate, regular rhythm, S1 normal, S2 normal and intact distal pulses. Exam reveals no gallop.  No murmur heard. Minor spider veins  Pulmonary/Chest: Effort normal and breath sounds normal. He has no wheezes. He has no rales. He exhibits no tenderness.  Abdominal: Bowel sounds are normal. He exhibits no distension and no mass. There is no hepatomegaly. There is no abdominal tenderness.  Musculoskeletal: Normal range of motion.        General: No tenderness, deformity or edema.  Neurological: He is alert and oriented to person, place, and time. He has normal motor skills.  Skin: Skin is warm and dry. No rash noted. No cyanosis. There is pallor. No jaundice. Nails show no clubbing.    Diagnostic Studies & Laboratory data:     Recent Radiology Findings:   Dg Chest 2 View  Result Date: 12/06/2018 CLINICAL DATA:  Intermittent chest pain for 2 weeks EXAM: CHEST - 2 VIEW COMPARISON:  10/25/2007 FINDINGS: Normal heart size, mediastinal contours, and pulmonary vascularity. Mild RIGHT basilar atelectasis. Lungs otherwise clear. No infiltrate, pleural effusion or pneumothorax. Bones unremarkable. IMPRESSION: Mild RIGHT basilar atelectasis. Electronically Signed   By: Lavonia Dana M.D.   On: 12/06/2018 16:01     I have independently reviewed the above radiologic studies and discussed with the patient   Recent Lab Findings: Lab Results  Component Value Date   WBC 8.0 12/07/2018   HGB 14.5 12/07/2018   HCT 41.3 12/07/2018   PLT 206 12/07/2018   GLUCOSE 98 12/07/2018   CHOL 196 12/07/2018   TRIG 182 (H) 12/07/2018   HDL 45 12/07/2018   LDLDIRECT 116.0 07/14/2018   LDLCALC 115 (H) 12/07/2018   ALT 22 07/14/2018   AST 18 07/14/2018   NA 139 12/07/2018   K 4.1 12/07/2018   CL 107 12/07/2018   CREATININE 1.04 12/07/2018   BUN 13 12/07/2018   CO2 23 12/07/2018   TSH 2.41 07/09/2016   INR 0.9 12/06/2018   HGBA1C 5.3 07/09/2016    LEFT HEART CATH AND CORONARY ANGIOGRAPHY  Conclusion      Ost LM lesion is 75% stenosed.  Prox Cx lesion is 90% stenosed.  Prox LAD to Mid LAD lesion is 25% stenosed.  The left ventricular systolic function is normal.  LV end diastolic pressure is normal. LVEDP 10 mm Hg.  The left ventricular ejection fraction is 35-45% by visual estimate.  There is no aortic valve stenosis.   Ulcerated lesions in the ostial left main and proximal circumflex.  Will plan for surgery consult.    Anterior hypokinesis from stunned myocardium.  I suspect LV function would return with revascularization.   D/w Dr. Ellyn Hack.  Will move to 2 Heart.  Restart heparin.     I Coronary Findings   Diagnostic  Dominance: Right  Left Main  Ost LM lesion 75% stenosed  Ost LM lesion is 75% stenosed. The lesion is eccentric, irregular and ulcerative.  Left Anterior Descending  Prox LAD to Mid  LAD lesion 25% stenosed  Prox LAD to Mid LAD lesion is 25% stenosed.  Left Circumflex  Prox Cx lesion 90% stenosed  Prox Cx lesion is 90% stenosed.  Intervention   No interventions have been documented.  Wall Motion   Resting               Left Heart   Left Ventricle The left ventricular size is normal. The left ventricular systolic function is normal. LV end diastolic pressure is normal. The left ventricular ejection fraction is 35-45% by visual estimate. There are LV function abnormalities due to segmental dysfunction.  Aortic Valve There is no aortic valve stenosis.  Coronary Diagrams   Diagnostic  Dominance: Right    ECHOCARDIOGRAM REPORT       Patient Name:   GUSTAF MCCARTER Date of Exam: 12/07/2018 Medical Rec #:  [id number]          Height:       71.0 in Accession #:    [id number]         Weight:       221.0 lb Date of Birth:  July 29, 1951          BSA:          2.20 m Patient Age:    62 years           BP:           137/81 mmHg Patient Gender: M                  HR:           71 bpm. Exam Location:  Inpatient    Procedure: 2D  Echo  Indications:    Acute myocardial infarction 410   History:        Patient has no prior history of Echocardiogram examinations.                 NSTEMI Risk Factors: Dyslipidemia. Obstructive sleep apnea.   Sonographer:    Talmage Coin Referring Phys: [id number] [place]    1. The left ventricle has normal systolic function, with an ejection fraction of 55-60%. The cavity size was normal. Left ventricular diastolic Doppler parameters are consistent with impaired relaxation.  2. Mild hypokinesis of the left ventricular, entire inferolateral wall.  3. Mild hypokinesis of the left ventricular, apical lateral wall and inferior wall.  4. The right ventricle has normal systolic function. The cavity was normal. There is no increase in right ventricular wall thickness.  FINDINGS  Left Ventricle: The left ventricle has normal systolic function, with an ejection fraction of 55-60%. The cavity size was normal. There is no increase in left ventricular wall thickness. Left ventricular diastolic Doppler parameters are consistent with  impaired relaxation. Mild hypokinesis of the left ventricular, entire inferolateral wall. Mild hypokinesis of the left ventricular, apical lateral wall and inferior wall. LV Wall Scoring: The basal and mid inferolateral wall, apical lateral segment, and apical inferior segment are hypokinetic.   Right Ventricle: The right ventricle has normal systolic function. The cavity was normal. There is no increase in right ventricular wall thickness. Left Atrium: left atrial size was normal in size Left Atrial Appendage: Right Atrium: right atrial size was normal in size. Right atrial pressure is estimated at 3 mmHg. Interatrial Septum: No atrial level shunt detected by color flow Doppler. Pericardium: There is no evidence of pericardial effusion. Mitral Valve: The mitral valve is normal in structure. Mitral valve regurgitation is  mild by color  flow Doppler. Tricuspid Valve: The tricuspid valve is normal in structure. Tricuspid valve regurgitation is trivial by color flow Doppler. Aortic Valve: The aortic valve is normal in structure. Aortic valve regurgitation is trivial by color flow Doppler. Pulmonic Valve: The pulmonic valve was grossly normal. Pulmonic valve regurgitation is trivial by color flow Doppler. Venous: The inferior vena cava is normal in size with greater than 50% respiratory variability.   LEFT VENTRICLE PLAX 2D LVIDd:         4.82 cm   Diastology LVIDs:         3.46 cm   LV e' lateral:   9.46 cm/s LV PW:         0.83 cm   LV E/e' lateral: 5.2 LV IVS:        0.81 cm   LV e' medial:    8.16 cm/s LVOT diam:     2.60 cm   LV E/e' medial:  6.0 LV SV:         59 ml LV SV Index:   26.07 LVOT Area:     5.31 cm  RIGHT VENTRICLE TAPSE (M-mode): 3.0 cm  LEFT ATRIUM             Index       RIGHT ATRIUM           Index LA diam:        4.20 cm 1.91 cm/m  RA Pressure: 3 mmHg LA Vol (A2C):   62.4 ml 28.36 ml/m RA Area:     17.30 cm LA Vol (A4C):   63.0 ml 28.64 ml/m RA Volume:   46.50 ml  21.14 ml/m LA Biplane Vol: 69.9 ml 31.77 ml/m  AORTIC VALVE LVOT Vmax:   94.00 cm/s LVOT Vmean:  61.200 cm/s LVOT VTI:    0.206 m   AORTA Ao Root diam: 3.30 cm  MITRAL VALVE MV Area (PHT): 2.32 cm   SHUNTS MV PHT:        94.83 msec Systemic VTI:  0.21 m MV Decel Time: 327 msec   Systemic Diam: 2.60 cm MV E velocity: 49.30 cm/s MV A velocity: 75.00 cm/s MV E/A ratio:  0.66    Cherlynn Kaiser MD Electronically signed by Cherlynn Kaiser MD Signature Date/Time: 12/07/2018/10:55:13 AM      Assessment / Plan: 1 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 Echo shows normal EFx(see full report) 2 Hyperlipidemia with poor triglyceride to HDL ratio and metabolic dysfunction due to borderline diabetes/insulin resistant.  He is willing to work on lifestyle and nutrition changes.  He also does meet  criteria for obesity by BMI. 3 OSA 4 other HX as above   Plan: CABG  With symptomatic left main and high grade cx disease I have  Recommended CABG to patient. Risks and options discussed in detail.  The goals risks and alternatives of the planned surgical procedure CABG   have been discussed with the patient in detail. The risks of the procedure including death, infection, stroke, myocardial infarction, bleeding, blood transfusion have all been discussed specifically.  I have quoted [name] a 2 % of perioperative mortality and a complication rate as high as 40  %. The patient's questions have been answered.IZZY COURVILLE is willing  to proceed with the planned procedure. I have also called patients wife and as conference discussed plans with him and her.   Grace Isaac MD      [address] [place] [address] Office [phone]   Beeper [phone]

## 2018-12-07 NOTE — Progress Notes (Signed)
TCTS consulted for CABG evaluation. °

## 2018-12-07 NOTE — Progress Notes (Signed)
[place] for Heparin Indication: chest pain/ACS  Allergies  Allergen Reactions  . Sulfonamide Derivatives     REACTION: rash Because of a history of documented adverse serious drug reaction;Medi Alert bracelet  is recommended    Patient Measurements: Height: 5\' 11"  (180.3 cm) Weight: 221 lb (100.2 kg) IBW/kg (Calculated) : 75.3 Heparin Dosing Weight: 96.5 kg  Vital Signs: Temp: 97.9 F (36.6 C) (03/31 0523) Temp Source: Oral (03/31 0523) BP: 119/80 (03/31 1140) Pulse Rate: 64 (03/31 1140)  Labs: Recent Labs    12/06/18 1628 12/07/18 0045 12/07/18 0319  HGB 14.7  --  14.5  HCT 44.0  --  41.3  PLT 213  --  206  APTT 28  --   --   LABPROT 12.5  --   --   INR 0.9  --   --   HEPARINUNFRC  --  0.22*  --   CREATININE 0.90  --  1.04  TROPONINI 0.19*  --  0.44*    Estimated Creatinine Clearance: 85.4 mL/min (by C-G formula based on SCr of 1.04 mg/dL).   Medical History: Past Medical History:  Diagnosis Date  . BPH (benign prostatic hypertrophy)    Flomax helpful 2018/19  . Chronic pain of right knee    Left knee as well (osteoarthritis).  Dr. Durward Fortes  . Colon polyps 2013   NON-adenomatous 2013---recall 10 yrs.  . Elevated PSA 07/10/2016   Dr. Gaynelle Arabian saw him 07/15/16, did f/u PSA and it was 2.78: f/u was recommended but pt did not do this as of 07/2017.  Repeat PSA here 07/2017 was 3.1, with 19% free (free a little low).  Pt then followed up with Dr. Gaynelle Arabian and tx for BPH w/plan of repeat PSA 1 yr.  PSA 07/2018 down to 3.21.  [name] Erectile dysfunction    Urol rx'd sildenafil 20mg  tabs 11/2017.  [name] Fatigue    + excessive daytime somnolence  . GERD (gastroesophageal reflux disease)   . Hallux limitus 04/2016   1st MPJ joint R foot.  Diclofenac helpful.  Injected by podiatrist 11/2016.  [name] Hyperlipidemia   . Hypogonadism male   . IFG (impaired fasting glucose)    A1c 5.8% 2014   Assessment: 27 yoM [facility] ED [facility]  3/30 with chest pain.  Pharmacy is consulted to dose Heparin for ACS. Pt now s/p LHC with L main disease and myocardial stunning - to resume IV heparin 8hr after sheath pull (~1100) for CABG workup.   Goal of Therapy:  Heparin level 0.3-0.7 units/ml Monitor platelets by anticoagulation protocol: Yes   Plan:  -Resume heparin 1400 units/hr at 1900 -Check 8hr heparin level with am labs  Arrie Senate, PharmD, BCPS Clinical Pharmacist [phone] Please check AMION for all [facility] Pharmacy numbers 12/07/2018

## 2018-12-07 NOTE — H&P (View-Only) (Signed)
Progress Note  Patient Name: Tyler [name] Date of Encounter: 12/07/2018  Primary Cardiologist: Glenetta Hew, MD    Subjective   No chest pain overnight. Planned for cardiac cath today.  No further pain since arrival to the emergency room.  Inpatient Medications    Scheduled Meds: . aspirin EC  81 mg Oral Daily  . atorvastatin  80 mg Oral q1800  . lisinopril  5 mg Oral Daily  . metoprolol succinate  25 mg Oral Daily  . pantoprazole  40 mg Oral Daily  . sodium chloride flush  3 mL Intravenous Q12H   Continuous Infusions: . sodium chloride    . sodium chloride    . heparin 1,400 Units/hr (12/07/18 0141)   PRN Meds: sodium chloride, acetaminophen, nitroGLYCERIN, ondansetron (ZOFRAN) IV, sodium chloride flush   Vital Signs    Vitals:   12/06/18 1946 12/06/18 2021 12/06/18 2032 12/07/18 0523  BP: (!) 159/84 (!) 151/95  138/89  Pulse: 73 72  68  Resp: 17 20  13   Temp:  97.8 F (36.6 C)  97.9 F (36.6 C)  TempSrc:  Oral  Oral  SpO2: 99% 98%  95%  Weight:  100.6 kg 100.6 kg 100.2 kg  Height:  5\' 11"  (1.803 m) 5\' 11"  (1.803 m)     Intake/Output Summary (Last 24 hours) at 12/07/2018 0831 Last data filed at 12/07/2018 0600 Gross per 24 hour  Intake 432.3 ml  Output 400 ml  Net 32.3 ml   Last 3 Weights 12/07/2018 12/06/2018 12/06/2018  Weight (lbs) 221 lb 221 lb 12.5 oz 221 lb 12.5 oz  Weight (kg) 100.245 kg 100.6 kg 100.6 kg      Telemetry    NSR 60s-70s - Personally Reviewed  ECG    N/a (not yet done) - Personally Reviewed  Physical Exam   Physical Exam  Constitutional: He is oriented to person, place, and time. He appears well-developed and well-nourished. No distress.  HENT:  Head: Normocephalic and atraumatic.  Eyes: Pupils are equal, round, and reactive to light. Conjunctivae and EOM are normal.  Neck: Normal range of motion. Neck supple. No hepatojugular reflux and no JVD present. Carotid bruit is not present.  Cardiovascular: Normal rate,  regular rhythm, normal heart sounds, intact distal pulses and normal pulses.  No extrasystoles are present. PMI is not displaced. Exam reveals no gallop and no friction rub.  No murmur heard. Pulmonary/Chest: Effort normal and breath sounds normal. No respiratory distress. He has no wheezes. He has no rales.  Abdominal: Soft. Bowel sounds are normal.  Musculoskeletal: Normal range of motion.        General: No deformity or edema.  Neurological: He is alert and oriented to person, place, and time.  Skin: Skin is warm and dry. He is not diaphoretic.  Psychiatric: His behavior is normal. Judgment and thought content normal.  Nursing note and vitals reviewed.   Labs    Chemistry Recent Labs  Lab 12/06/18 1628 12/07/18 0319  NA 139 139  K 3.6 4.1  CL 110 107  CO2 22 23  GLUCOSE 154* 98  BUN 18 13  CREATININE 0.90 1.04  CALCIUM 8.5* 8.7*  GFRNONAA >60 >60  GFRAA >60 >60  ANIONGAP 7 9     Hematology Recent Labs  Lab 12/06/18 1628 12/07/18 0319  WBC 6.9 8.0  RBC 4.76 4.65  HGB 14.7 14.5  HCT 44.0 41.3  MCV 92.4 88.8  MCH 30.9 31.2  MCHC 33.4 35.1  RDW 12.5  12.7  PLT 213 206    Cardiac Enzymes Recent Labs  Lab 12/06/18 1628 12/07/18 0319  TROPONINI 0.19* 0.44*   No results for input(s): TROPIPOC in the last 168 hours.   BNPNo results for input(s): BNP, PROBNP in the last 168 hours.   DDimer No results for input(s): DDIMER in the last 168 hours.   Radiology    Dg Chest 2 View  Result Date: 12/06/2018 CLINICAL DATA:  Intermittent chest pain for 2 weeks EXAM: CHEST - 2 VIEW COMPARISON:  10/25/2007 FINDINGS: Normal heart size, mediastinal contours, and pulmonary vascularity. Mild RIGHT basilar atelectasis. Lungs otherwise clear. No infiltrate, pleural effusion or pneumothorax. Bones unremarkable. IMPRESSION: Mild RIGHT basilar atelectasis. Electronically Signed   By: Lavonia Dana M.D.   On: 12/06/2018 16:01    Cardiac Studies   TTE: pending  Patient Profile      67 y.o. male with PMH of HL, GERD, BPH who presented with intermittent chest pain and found to have elevated troponins -consistent with non-ST elevation MI.  Assessment & Plan    1. NSTEMI: Presented with onset of chest pain. Both exertional and rest symptoms for the past 2 weeks. Most recent was yesterday while sitting in his home office yesterday.  --> Symptoms concerning for ACS. Troponin up to 0.4 this morning.   Remains on IV heparin.   ASA, statin, BB, ACEi - BP stable  plan for cardiac cath today. The patient understands that risks included but are not limited to stroke (1 in 1000), death (1 in 61), kidney failure [usually temporary] (1 in 500), bleeding (1 in 200), allergic reaction [possibly serious] (1 in 200).   2. HL: on pravastatin PTA, switched to high dose atorvastatin on admission. LDL 115  3. GERD: on protonix  4. OSA: was compliant with Cpap prior to admission but recently not using 2/2 to issues with fitting. Will need to readdress at some point as an outpatient. -- needs to re-establish with Sleep Med MD.   For questions or updates, please contact [facility] Please consult [email or web address] for contact info under     Signed, Glenetta Hew, MD  12/07/2018, 8:31 AM    ATTENDING ATTESTATION  I have seen, examined and evaluated the patient this AM along with Reino Bellis, NP-C.  After reviewing all the available data and chart, we discussed the patients laboratory, study & physical findings as well as symptoms in detail. I agree with her findings, examination as well as impression recommendations as per our discussion.    Attending adjustments noted in italics.  Doing well stable after mild non-STEMI.  Plan for cardiac catheterization today.  Remains on heparin. Converted to high-dose statin, added low-dose beta-blocker to his ACE inhibitor -can titrate pending blood pressure follow-up  More plans based on cath results.  Anticipate discharge in the morning  if PCI.   Glenetta Hew, M.D., M.S. Interventional Cardiologist   Pager # [phone] Phone # [phone] [address] [place], [place] [address]

## 2018-12-08 ENCOUNTER — Inpatient Hospital Stay (HOSPITAL_COMMUNITY): Payer: Medicare HMO

## 2018-12-08 ENCOUNTER — Telehealth: Payer: Self-pay | Admitting: Cardiothoracic Surgery

## 2018-12-08 ENCOUNTER — Encounter (HOSPITAL_COMMUNITY)
Admission: EM | Disposition: A | Discharge status: Home / Self Care | Payer: Self-pay | Source: Home / Self Care | Attending: Cardiothoracic Surgery

## 2018-12-08 ENCOUNTER — Inpatient Hospital Stay (HOSPITAL_COMMUNITY): Payer: Medicare HMO | Admitting: Certified Registered Nurse Anesthetist

## 2018-12-08 DIAGNOSIS — I2510 Atherosclerotic heart disease of native coronary artery without angina pectoris: Secondary | ICD-10-CM | POA: Diagnosis present

## 2018-12-08 DIAGNOSIS — Z951 Presence of aortocoronary bypass graft: Secondary | ICD-10-CM

## 2018-12-08 DIAGNOSIS — I4891 Unspecified atrial fibrillation: Secondary | ICD-10-CM

## 2018-12-08 DIAGNOSIS — I214 Non-ST elevation (NSTEMI) myocardial infarction: Secondary | ICD-10-CM

## 2018-12-08 HISTORY — PX: TEE WITHOUT CARDIOVERSION: SHX5443

## 2018-12-08 HISTORY — PX: CORONARY ARTERY BYPASS GRAFT: SHX141

## 2018-12-08 HISTORY — DX: Unspecified atrial fibrillation: I48.91

## 2018-12-08 HISTORY — DX: Non-ST elevation (NSTEMI) myocardial infarction: I21.4

## 2018-12-08 LAB — POCT I-STAT 7, (LYTES, BLD GAS, ICA,H+H)
Acid-base deficit: 1 mmol/L (ref 0.0–2.0)
Acid-base deficit: 3 mmol/L — ABNORMAL HIGH (ref 0.0–2.0)
Acid-base deficit: 1 mmol/L (ref 0.0–2.0)
Acid-base deficit: 2 mmol/L (ref 0.0–2.0)
Acid-base deficit: 4 mmol/L — ABNORMAL HIGH (ref 0.0–2.0)
Bicarbonate: 25.2 mmol/L (ref 20.0–28.0)
Bicarbonate: 24.6 mmol/L (ref 20.0–28.0)
Bicarbonate: 22 mmol/L (ref 20.0–28.0)
Bicarbonate: 25 mmol/L (ref 20.0–28.0)
Bicarbonate: 24.6 mmol/L (ref 20.0–28.0)
Bicarbonate: 22.4 mmol/L (ref 20.0–28.0)
Calcium, Ion: 1.27 mmol/L (ref 1.15–1.40)
Calcium, Ion: 0.99 mmol/L — ABNORMAL LOW (ref 1.15–1.40)
Calcium, Ion: 1.12 mmol/L — ABNORMAL LOW (ref 1.15–1.40)
Calcium, Ion: 1.18 mmol/L (ref 1.15–1.40)
Calcium, Ion: 1.18 mmol/L (ref 1.15–1.40)
Calcium, Ion: 1.17 mmol/L (ref 1.15–1.40)
HCT: 38 % — ABNORMAL LOW (ref 39.0–52.0)
HCT: 32 % — ABNORMAL LOW (ref 39.0–52.0)
HCT: 29 % — ABNORMAL LOW (ref 39.0–52.0)
HCT: 38 % — ABNORMAL LOW (ref 39.0–52.0)
HCT: 39 % (ref 39.0–52.0)
HCT: 37 % — ABNORMAL LOW (ref 39.0–52.0)
Hemoglobin: 12.9 g/dL — ABNORMAL LOW (ref 13.0–17.0)
Hemoglobin: 10.9 g/dL — ABNORMAL LOW (ref 13.0–17.0)
Hemoglobin: 9.9 g/dL — ABNORMAL LOW (ref 13.0–17.0)
Hemoglobin: 12.9 g/dL — ABNORMAL LOW (ref 13.0–17.0)
Hemoglobin: 13.3 g/dL (ref 13.0–17.0)
Hemoglobin: 12.6 g/dL — ABNORMAL LOW (ref 13.0–17.0)
O2 Saturation: 100 %
O2 Saturation: 100 %
O2 Saturation: 100 %
O2 Saturation: 99 %
O2 Saturation: 100 %
O2 Saturation: 97 %
Patient temperature: 36.1
Patient temperature: 37
Patient temperature: 37.6
Potassium: 4.4 mmol/L (ref 3.5–5.1)
Potassium: 4.3 mmol/L (ref 3.5–5.1)
Potassium: 4.3 mmol/L (ref 3.5–5.1)
Potassium: 4.3 mmol/L (ref 3.5–5.1)
Potassium: 4.6 mmol/L (ref 3.5–5.1)
Potassium: 4.5 mmol/L (ref 3.5–5.1)
Sodium: 140 mmol/L (ref 135–145)
Sodium: 140 mmol/L (ref 135–145)
Sodium: 137 mmol/L (ref 135–145)
Sodium: 140 mmol/L (ref 135–145)
Sodium: 141 mmol/L (ref 135–145)
Sodium: 141 mmol/L (ref 135–145)
TCO2: 27 mmol/L (ref 22–32)
TCO2: 26 mmol/L (ref 22–32)
TCO2: 23 mmol/L (ref 22–32)
TCO2: 26 mmol/L (ref 22–32)
TCO2: 26 mmol/L (ref 22–32)
TCO2: 24 mmol/L (ref 22–32)
pCO2 arterial: 49.5 mmHg — ABNORMAL HIGH (ref 32.0–48.0)
pCO2 arterial: 41.1 mmHg (ref 32.0–48.0)
pCO2 arterial: 38.1 mmHg (ref 32.0–48.0)
pCO2 arterial: 45.3 mmHg (ref 32.0–48.0)
pCO2 arterial: 49.3 mmHg — ABNORMAL HIGH (ref 32.0–48.0)
pCO2 arterial: 44.5 mmHg (ref 32.0–48.0)
pH, Arterial: 7.315 — ABNORMAL LOW (ref 7.350–7.450)
pH, Arterial: 7.385 (ref 7.350–7.450)
pH, Arterial: 7.369 (ref 7.350–7.450)
pH, Arterial: 7.346 — ABNORMAL LOW (ref 7.350–7.450)
pH, Arterial: 7.306 — ABNORMAL LOW (ref 7.350–7.450)
pH, Arterial: 7.312 — ABNORMAL LOW (ref 7.350–7.450)
pO2, Arterial: 397 mmHg — ABNORMAL HIGH (ref 83.0–108.0)
pO2, Arterial: 321 mmHg — ABNORMAL HIGH (ref 83.0–108.0)
pO2, Arterial: 292 mmHg — ABNORMAL HIGH (ref 83.0–108.0)
pO2, Arterial: 134 mmHg — ABNORMAL HIGH (ref 83.0–108.0)
pO2, Arterial: 239 mmHg — ABNORMAL HIGH (ref 83.0–108.0)
pO2, Arterial: 100 mmHg (ref 83.0–108.0)

## 2018-12-08 LAB — POCT I-STAT 4, (NA,K, GLUC, HGB,HCT)
Glucose, Bld: 100 mg/dL — ABNORMAL HIGH (ref 70–99)
Glucose, Bld: 110 mg/dL — ABNORMAL HIGH (ref 70–99)
Glucose, Bld: 98 mg/dL (ref 70–99)
Glucose, Bld: 123 mg/dL — ABNORMAL HIGH (ref 70–99)
Glucose, Bld: 143 mg/dL — ABNORMAL HIGH (ref 70–99)
Glucose, Bld: 119 mg/dL — ABNORMAL HIGH (ref 70–99)
HCT: 38 % — ABNORMAL LOW (ref 39.0–52.0)
HCT: 30 % — ABNORMAL LOW (ref 39.0–52.0)
HCT: 37 % — ABNORMAL LOW (ref 39.0–52.0)
HCT: 26 % — ABNORMAL LOW (ref 39.0–52.0)
HCT: 28 % — ABNORMAL LOW (ref 39.0–52.0)
HCT: 37 % — ABNORMAL LOW (ref 39.0–52.0)
Hemoglobin: 12.9 g/dL — ABNORMAL LOW (ref 13.0–17.0)
Hemoglobin: 10.2 g/dL — ABNORMAL LOW (ref 13.0–17.0)
Hemoglobin: 12.6 g/dL — ABNORMAL LOW (ref 13.0–17.0)
Hemoglobin: 8.8 g/dL — ABNORMAL LOW (ref 13.0–17.0)
Hemoglobin: 9.5 g/dL — ABNORMAL LOW (ref 13.0–17.0)
Hemoglobin: 12.6 g/dL — ABNORMAL LOW (ref 13.0–17.0)
Potassium: 4.4 mmol/L (ref 3.5–5.1)
Potassium: 4.3 mmol/L (ref 3.5–5.1)
Potassium: 4.4 mmol/L (ref 3.5–5.1)
Potassium: 4.8 mmol/L (ref 3.5–5.1)
Potassium: 4.3 mmol/L (ref 3.5–5.1)
Potassium: 4.1 mmol/L (ref 3.5–5.1)
Sodium: 139 mmol/L (ref 135–145)
Sodium: 139 mmol/L (ref 135–145)
Sodium: 140 mmol/L (ref 135–145)
Sodium: 133 mmol/L — ABNORMAL LOW (ref 135–145)
Sodium: 137 mmol/L (ref 135–145)
Sodium: 139 mmol/L (ref 135–145)

## 2018-12-08 LAB — PLATELET COUNT: Platelets: 139 10*3/uL — ABNORMAL LOW (ref 150–400)

## 2018-12-08 LAB — CBC
HCT: 43.8 % (ref 39.0–52.0)
HCT: 39.1 % (ref 39.0–52.0)
HCT: 39.3 % (ref 39.0–52.0)
HEMOGLOBIN: 14.9 g/dL (ref 13.0–17.0)
Hemoglobin: 13.3 g/dL (ref 13.0–17.0)
Hemoglobin: 13.2 g/dL (ref 13.0–17.0)
MCH: 30.9 pg (ref 26.0–34.0)
MCH: 30.6 pg (ref 26.0–34.0)
MCH: 30.3 pg (ref 26.0–34.0)
MCHC: 34 g/dL (ref 30.0–36.0)
MCHC: 34 g/dL (ref 30.0–36.0)
MCHC: 33.6 g/dL (ref 30.0–36.0)
MCV: 90.9 fL (ref 80.0–100.0)
MCV: 89.9 fL (ref 80.0–100.0)
MCV: 90.1 fL (ref 80.0–100.0)
Platelets: 217 10*3/uL (ref 150–400)
Platelets: 156 10*3/uL (ref 150–400)
Platelets: 171 10*3/uL (ref 150–400)
RBC: 4.82 MIL/uL (ref 4.22–5.81)
RBC: 4.35 MIL/uL (ref 4.22–5.81)
RBC: 4.36 MIL/uL (ref 4.22–5.81)
RDW: 13.1 % (ref 11.5–15.5)
RDW: 12.6 % (ref 11.5–15.5)
RDW: 12.6 % (ref 11.5–15.5)
WBC: 8.4 10*3/uL (ref 4.0–10.5)
WBC: 17.5 10*3/uL — ABNORMAL HIGH (ref 4.0–10.5)
WBC: 17.6 10*3/uL — ABNORMAL HIGH (ref 4.0–10.5)
nRBC: 0 % (ref 0.0–0.2)
nRBC: 0 % (ref 0.0–0.2)
nRBC: 0 % (ref 0.0–0.2)

## 2018-12-08 LAB — HEMOGLOBIN AND HEMATOCRIT, BLOOD
HCT: 27.3 % — ABNORMAL LOW (ref 39.0–52.0)
Hemoglobin: 9.3 g/dL — ABNORMAL LOW (ref 13.0–17.0)

## 2018-12-08 LAB — PROTIME-INR
INR: 1.3 — ABNORMAL HIGH (ref 0.8–1.2)
Prothrombin Time: 16.3 seconds — ABNORMAL HIGH (ref 11.4–15.2)

## 2018-12-08 LAB — ECHO INTRAOPERATIVE TEE
Height: 71 in
Weight: 3520.31 oz

## 2018-12-08 LAB — BASIC METABOLIC PANEL
Anion gap: 8 (ref 5–15)
Anion gap: 8 (ref 5–15)
BUN: 14 mg/dL (ref 8–23)
BUN: 11 mg/dL (ref 8–23)
CO2: 21 mmol/L — ABNORMAL LOW (ref 22–32)
CO2: 21 mmol/L — ABNORMAL LOW (ref 22–32)
Calcium: 9.4 mg/dL (ref 8.9–10.3)
Calcium: 7.9 mg/dL — ABNORMAL LOW (ref 8.9–10.3)
Chloride: 110 mmol/L (ref 98–111)
Chloride: 110 mmol/L (ref 98–111)
Creatinine, Ser: 1.04 mg/dL (ref 0.61–1.24)
Creatinine, Ser: 0.82 mg/dL (ref 0.61–1.24)
GFR calc Af Amer: >60 mL/min (ref >60)
GFR calc Af Amer: >60 mL/min (ref >60)
GFR calc non Af Amer: >60 mL/min (ref >60)
GFR calc non Af Amer: >60 mL/min (ref >60)
Glucose, Bld: 104 mg/dL — ABNORMAL HIGH (ref 70–99)
Glucose, Bld: 143 mg/dL — ABNORMAL HIGH (ref 70–99)
Potassium: 4.3 mmol/L (ref 3.5–5.1)
Potassium: 4.4 mmol/L (ref 3.5–5.1)
Sodium: 139 mmol/L (ref 135–145)
Sodium: 139 mmol/L (ref 135–145)

## 2018-12-08 LAB — APTT: aPTT: 28 seconds (ref 24–36)

## 2018-12-08 LAB — GLUCOSE, CAPILLARY
Glucose-Capillary: 117 mg/dL — ABNORMAL HIGH (ref 70–99)
Glucose-Capillary: 89 mg/dL (ref 70–99)
Glucose-Capillary: 104 mg/dL — ABNORMAL HIGH (ref 70–99)
Glucose-Capillary: 120 mg/dL — ABNORMAL HIGH (ref 70–99)
Glucose-Capillary: 128 mg/dL — ABNORMAL HIGH (ref 70–99)
Glucose-Capillary: 134 mg/dL — ABNORMAL HIGH (ref 70–99)
Glucose-Capillary: 141 mg/dL — ABNORMAL HIGH (ref 70–99)

## 2018-12-08 LAB — MAGNESIUM: Magnesium: 2.9 mg/dL — ABNORMAL HIGH (ref 1.7–2.4)

## 2018-12-08 SURGERY — CORONARY ARTERY BYPASS GRAFTING (CABG)
Anesthesia: General | Site: Chest

## 2018-12-08 MED ORDER — SODIUM CHLORIDE 0.9 % IV SOLN: INTRAVENOUS | Status: DC | PRN

## 2018-12-08 MED ORDER — FENTANYL CITRATE (PF) 250 MCG/5ML IJ SOLN
INTRAMUSCULAR | Status: AC
  Filled 2018-12-08: qty 25

## 2018-12-08 MED ORDER — ROCURONIUM BROMIDE 10 MG/ML (PF) SYRINGE
PREFILLED_SYRINGE | INTRAVENOUS | Status: DC | PRN
  Administered 2018-12-08 (×5): 50 mg via INTRAVENOUS

## 2018-12-08 MED ORDER — LACTATED RINGERS IV SOLN: INTRAVENOUS | Status: DC

## 2018-12-08 MED ORDER — HEMOSTATIC AGENTS (NO CHARGE) OPTIME
TOPICAL | Status: DC | PRN
  Administered 2018-12-08: 1 via TOPICAL

## 2018-12-08 MED ORDER — DEXMEDETOMIDINE HCL IN NACL 200 MCG/50ML IV SOLN
0.0000 ug/kg/h | INTRAVENOUS | Status: DC
  Administered 2018-12-08: 18:00:00 0.1 ug/kg/h via INTRAVENOUS
  Administered 2018-12-08: 16:00:00 0.4 ug/kg/h via INTRAVENOUS
  Administered 2018-12-08: 14:00:00 0.7 ug/kg/h via INTRAVENOUS

## 2018-12-08 MED ORDER — DOCUSATE SODIUM 100 MG PO CAPS
200.0000 mg | ORAL_CAPSULE | Freq: Every day | ORAL | Status: DC
  Administered 2018-12-09 – 2018-12-13 (×4): 200 mg via ORAL
  Filled 2018-12-08 (×4): qty 2

## 2018-12-08 MED ORDER — OXYCODONE HCL 5 MG PO TABS: 5.0000 mg | ORAL_TABLET | ORAL | Status: DC | PRN

## 2018-12-08 MED ORDER — SODIUM CHLORIDE 0.45 % IV SOLN
INTRAVENOUS | Status: DC | PRN
  Administered 2018-12-08: 14:00:00 via INTRAVENOUS

## 2018-12-08 MED ORDER — PROTAMINE SULFATE 10 MG/ML IV SOLN
INTRAVENOUS | Status: AC
  Filled 2018-12-08: qty 5

## 2018-12-08 MED ORDER — INSULIN ASPART 100 UNIT/ML ~~LOC~~ SOLN
0.0000 [IU] | SUBCUTANEOUS | Status: DC
  Administered 2018-12-08 – 2018-12-09 (×3): 2 [IU] via SUBCUTANEOUS

## 2018-12-08 MED ORDER — BISACODYL 10 MG RE SUPP: 10.0000 mg | Freq: Every day | RECTAL | Status: DC

## 2018-12-08 MED ORDER — SUCCINYLCHOLINE CHLORIDE 20 MG/ML IJ SOLN
INTRAMUSCULAR | Status: DC | PRN
  Administered 2018-12-08: 100 mg via INTRAVENOUS

## 2018-12-08 MED ORDER — PROTAMINE SULFATE 10 MG/ML IV SOLN
INTRAVENOUS | Status: AC
  Filled 2018-12-08: qty 25

## 2018-12-08 MED ORDER — SODIUM CHLORIDE 0.9 % IV SOLN: 250.0000 mL | INTRAVENOUS | Status: DC

## 2018-12-08 MED ORDER — SODIUM CHLORIDE 0.9 % IV SOLN: INTRAVENOUS | Status: DC

## 2018-12-08 MED ORDER — LACTATED RINGERS IV SOLN: 500.0000 mL | Freq: Once | INTRAVENOUS | Status: DC | PRN

## 2018-12-08 MED ORDER — 0.9 % SODIUM CHLORIDE (POUR BTL) OPTIME
TOPICAL | Status: DC | PRN
  Administered 2018-12-08: 10:00:00 6000 mL

## 2018-12-08 MED ORDER — INSULIN REGULAR(HUMAN) IN NACL 100-0.9 UT/100ML-% IV SOLN
INTRAVENOUS | Status: DC
  Administered 2018-12-08: 14:00:00 1.2 [IU]/h via INTRAVENOUS

## 2018-12-08 MED ORDER — CHLORHEXIDINE GLUCONATE 0.12 % MT SOLN
15.0000 mL | OROMUCOSAL | Status: AC
  Administered 2018-12-08: 15 mL via OROMUCOSAL

## 2018-12-08 MED ORDER — PHENYLEPHRINE HCL 10 MG/ML IJ SOLN
INTRAMUSCULAR | Status: DC | PRN
  Administered 2018-12-08 (×2): 120 ug via INTRAVENOUS

## 2018-12-08 MED ORDER — FAMOTIDINE 20 MG IN NS 100 ML IVPB
20.0000 mg | Freq: Two times a day (BID) | INTRAVENOUS | Status: AC
  Administered 2018-12-08 (×2): 20 mg via INTRAVENOUS
  Filled 2018-12-08 (×2): qty 100

## 2018-12-08 MED ORDER — SODIUM CHLORIDE 0.9% FLUSH
10.0000 mL | Freq: Two times a day (BID) | INTRAVENOUS | Status: DC
  Administered 2018-12-09: 20 mL
  Administered 2018-12-09: 10 mL
  Administered 2018-12-10: 09:00:00 20 mL

## 2018-12-08 MED ORDER — MIDAZOLAM HCL (PF) 10 MG/2ML IJ SOLN
INTRAMUSCULAR | Status: AC
  Filled 2018-12-08: qty 2

## 2018-12-08 MED ORDER — MAGNESIUM SULFATE 4 GM/100ML IV SOLN
4.0000 g | Freq: Once | INTRAVENOUS | Status: AC
  Administered 2018-12-08: 15:00:00 4 g via INTRAVENOUS
  Filled 2018-12-08: qty 100

## 2018-12-08 MED ORDER — FENTANYL CITRATE (PF) 100 MCG/2ML IJ SOLN
INTRAMUSCULAR | Status: DC | PRN
  Administered 2018-12-08: 100 ug via INTRAVENOUS
  Administered 2018-12-08 (×2): 50 ug via INTRAVENOUS
  Administered 2018-12-08 (×3): 100 ug via INTRAVENOUS
  Administered 2018-12-08: 50 ug via INTRAVENOUS
  Administered 2018-12-08: 450 ug via INTRAVENOUS
  Administered 2018-12-08: 50 ug via INTRAVENOUS

## 2018-12-08 MED ORDER — METOPROLOL TARTRATE 5 MG/5ML IV SOLN
2.5000 mg | INTRAVENOUS | Status: DC | PRN
  Filled 2018-12-08: qty 5

## 2018-12-08 MED ORDER — ALBUMIN HUMAN 5 % IV SOLN
250.0000 mL | INTRAVENOUS | Status: AC | PRN
  Administered 2018-12-08 – 2018-12-09 (×3): 12.5 g via INTRAVENOUS
  Filled 2018-12-08: qty 500

## 2018-12-08 MED ORDER — VANCOMYCIN HCL IN DEXTROSE 1-5 GM/200ML-% IV SOLN
1000.0000 mg | Freq: Once | INTRAVENOUS | Status: AC
  Administered 2018-12-08: 20:00:00 1000 mg via INTRAVENOUS
  Filled 2018-12-08: qty 200

## 2018-12-08 MED ORDER — ACETAMINOPHEN 500 MG PO TABS
1000.0000 mg | ORAL_TABLET | Freq: Four times a day (QID) | ORAL | Status: DC
  Administered 2018-12-08 – 2018-12-13 (×17): 1000 mg via ORAL
  Filled 2018-12-08 (×14): qty 2

## 2018-12-08 MED ORDER — ASPIRIN 81 MG PO CHEW: 324.0000 mg | CHEWABLE_TABLET | Freq: Every day | ORAL | Status: DC

## 2018-12-08 MED ORDER — ROCURONIUM BROMIDE 50 MG/5ML IV SOSY
PREFILLED_SYRINGE | INTRAVENOUS | Status: AC
  Filled 2018-12-08: qty 15

## 2018-12-08 MED ORDER — POTASSIUM CHLORIDE 10 MEQ/50ML IV SOLN: 10.0000 meq | INTRAVENOUS | Status: AC

## 2018-12-08 MED ORDER — ROCURONIUM BROMIDE 50 MG/5ML IV SOSY
PREFILLED_SYRINGE | INTRAVENOUS | Status: AC
  Filled 2018-12-08: qty 5

## 2018-12-08 MED ORDER — ASPIRIN EC 325 MG PO TBEC
325.0000 mg | DELAYED_RELEASE_TABLET | Freq: Every day | ORAL | Status: DC
  Administered 2018-12-09 – 2018-12-13 (×5): 325 mg via ORAL
  Filled 2018-12-08 (×5): qty 1

## 2018-12-08 MED ORDER — INSULIN REGULAR BOLUS VIA INFUSION
0.0000 [IU] | Freq: Three times a day (TID) | INTRAVENOUS | Status: DC
  Filled 2018-12-08: qty 10

## 2018-12-08 MED ORDER — ONDANSETRON HCL 4 MG/2ML IJ SOLN
4.0000 mg | Freq: Four times a day (QID) | INTRAMUSCULAR | Status: DC | PRN
  Administered 2018-12-09: 4 mg via INTRAVENOUS
  Filled 2018-12-08 (×2): qty 2

## 2018-12-08 MED ORDER — ACETAMINOPHEN 160 MG/5ML PO SOLN
650.0000 mg | Freq: Once | ORAL | Status: AC
  Administered 2018-12-08: 650 mg
  Filled 2018-12-08: qty 20.3

## 2018-12-08 MED ORDER — PROPOFOL 10 MG/ML IV BOLUS
INTRAVENOUS | Status: AC
  Filled 2018-12-08: qty 20

## 2018-12-08 MED ORDER — TRAMADOL HCL 50 MG PO TABS: 50.0000 mg | ORAL_TABLET | ORAL | Status: DC | PRN

## 2018-12-08 MED ORDER — LACTATED RINGERS IV SOLN
INTRAVENOUS | Status: DC | PRN
  Administered 2018-12-08: 08:00:00 via INTRAVENOUS

## 2018-12-08 MED ORDER — SODIUM CHLORIDE (PF) 0.9 % IJ SOLN
OROMUCOSAL | Status: DC | PRN
  Administered 2018-12-08 (×3): via TOPICAL

## 2018-12-08 MED ORDER — PROPOFOL 10 MG/ML IV BOLUS
INTRAVENOUS | Status: DC | PRN
  Administered 2018-12-08 (×3): 10 mg via INTRAVENOUS
  Administered 2018-12-08: 70 mg via INTRAVENOUS

## 2018-12-08 MED ORDER — NITROGLYCERIN IN D5W 200-5 MCG/ML-% IV SOLN: 0.0000 ug/min | INTRAVENOUS | Status: DC

## 2018-12-08 MED ORDER — SUCCINYLCHOLINE CHLORIDE 200 MG/10ML IV SOSY
PREFILLED_SYRINGE | INTRAVENOUS | Status: AC
  Filled 2018-12-08: qty 10

## 2018-12-08 MED ORDER — HEPARIN SODIUM (PORCINE) 1000 UNIT/ML IJ SOLN
INTRAMUSCULAR | Status: DC | PRN
  Administered 2018-12-08: 35000 [IU] via INTRAVENOUS

## 2018-12-08 MED ORDER — CHLORHEXIDINE GLUCONATE CLOTH 2 % EX PADS
6.0000 | MEDICATED_PAD | Freq: Every day | CUTANEOUS | Status: DC
  Administered 2018-12-09 – 2018-12-13 (×4): 6 via TOPICAL

## 2018-12-08 MED ORDER — ACETAMINOPHEN 160 MG/5ML PO SOLN: 1000.0000 mg | Freq: Four times a day (QID) | ORAL | Status: DC

## 2018-12-08 MED ORDER — MIDAZOLAM HCL 2 MG/2ML IJ SOLN: 2.0000 mg | INTRAMUSCULAR | Status: DC | PRN

## 2018-12-08 MED ORDER — MORPHINE SULFATE (PF) 2 MG/ML IV SOLN
1.0000 mg | INTRAVENOUS | Status: DC | PRN
  Administered 2018-12-08 (×2): 2 mg via INTRAVENOUS
  Administered 2018-12-08: 18:00:00 1 mg via INTRAVENOUS
  Administered 2018-12-09: 2 mg via INTRAVENOUS
  Filled 2018-12-08 (×5): qty 1

## 2018-12-08 MED ORDER — SODIUM CHLORIDE 0.9% FLUSH: 3.0000 mL | Freq: Two times a day (BID) | INTRAVENOUS | Status: DC

## 2018-12-08 MED ORDER — DEXMEDETOMIDINE HCL IN NACL 200 MCG/50ML IV SOLN
INTRAVENOUS | Status: DC | PRN
  Administered 2018-12-08: .3 ug/kg/h via INTRAVENOUS

## 2018-12-08 MED ORDER — LACTATED RINGERS IV SOLN
INTRAVENOUS | Status: DC
  Administered 2018-12-08 (×2): via INTRAVENOUS

## 2018-12-08 MED ORDER — ORAL CARE MOUTH RINSE
15.0000 mL | Freq: Two times a day (BID) | OROMUCOSAL | Status: DC
  Administered 2018-12-09 – 2018-12-11 (×4): 15 mL via OROMUCOSAL

## 2018-12-08 MED ORDER — PHENYLEPHRINE HCL-NACL 20-0.9 MG/250ML-% IV SOLN
0.0000 ug/min | INTRAVENOUS | Status: DC
  Administered 2018-12-08: 14:00:00 20 ug/min via INTRAVENOUS
  Administered 2018-12-09: 01:00:00 40 ug/min via INTRAVENOUS
  Filled 2018-12-08: qty 250

## 2018-12-08 MED ORDER — SODIUM CHLORIDE 0.9% FLUSH: 3.0000 mL | INTRAVENOUS | Status: DC | PRN

## 2018-12-08 MED ORDER — EPHEDRINE SULFATE 50 MG/ML IJ SOLN
INTRAMUSCULAR | Status: DC | PRN
  Administered 2018-12-08: 5 mg via INTRAVENOUS

## 2018-12-08 MED ORDER — BISACODYL 5 MG PO TBEC
10.0000 mg | DELAYED_RELEASE_TABLET | Freq: Every day | ORAL | Status: DC
  Administered 2018-12-09 – 2018-12-13 (×4): 10 mg via ORAL
  Filled 2018-12-08 (×4): qty 2

## 2018-12-08 MED ORDER — SODIUM CHLORIDE 0.9 % IV SOLN
INTRAVENOUS | Status: DC | PRN
  Administered 2018-12-08: 25 ug/min via INTRAVENOUS

## 2018-12-08 MED ORDER — METOPROLOL TARTRATE 12.5 MG HALF TABLET
12.5000 mg | ORAL_TABLET | Freq: Two times a day (BID) | ORAL | Status: DC
  Administered 2018-12-09: 12.5 mg via ORAL
  Filled 2018-12-08 (×2): qty 1

## 2018-12-08 MED ORDER — ROCURONIUM BROMIDE 50 MG/5ML IV SOSY
PREFILLED_SYRINGE | INTRAVENOUS | Status: AC
  Filled 2018-12-08: qty 10

## 2018-12-08 MED ORDER — ACETAMINOPHEN 650 MG RE SUPP: 650.0000 mg | Freq: Once | RECTAL | Status: AC

## 2018-12-08 MED ORDER — METOPROLOL TARTRATE 25 MG/10 ML ORAL SUSPENSION: 12.5000 mg | Freq: Two times a day (BID) | ORAL | Status: DC

## 2018-12-08 MED ORDER — SODIUM CHLORIDE 0.9 % IV SOLN
INTRAVENOUS | Status: DC | PRN
  Administered 2018-12-08: 15 ug/min via INTRAVENOUS

## 2018-12-08 MED ORDER — MIDAZOLAM HCL 5 MG/5ML IJ SOLN
INTRAMUSCULAR | Status: DC | PRN
  Administered 2018-12-08 (×3): 1 mg via INTRAVENOUS
  Administered 2018-12-08: 2 mg via INTRAVENOUS
  Administered 2018-12-08 (×2): 1 mg via INTRAVENOUS
  Administered 2018-12-08: 2 mg via INTRAVENOUS
  Administered 2018-12-08: 1 mg via INTRAVENOUS

## 2018-12-08 MED ORDER — SODIUM CHLORIDE 0.9% FLUSH: 10.0000 mL | INTRAVENOUS | Status: DC | PRN

## 2018-12-08 MED ORDER — PROTAMINE SULFATE 10 MG/ML IV SOLN
INTRAVENOUS | Status: DC | PRN
  Administered 2018-12-08: 10 mg via INTRAVENOUS
  Administered 2018-12-08: 340 mg via INTRAVENOUS

## 2018-12-08 MED ORDER — LEVOFLOXACIN IN D5W 750 MG/150ML IV SOLN
750.0000 mg | INTRAVENOUS | Status: AC
  Administered 2018-12-09: 10:00:00 750 mg via INTRAVENOUS
  Filled 2018-12-08: qty 150

## 2018-12-08 SURGICAL SUPPLY — 66 items
BAG DECANTER FOR FLEXI CONT (MISCELLANEOUS) ×3 IMPLANT
BANDAGE ACE 4X5 VEL STRL LF (GAUZE/BANDAGES/DRESSINGS) ×3 IMPLANT
BANDAGE ACE 6X5 VEL STRL LF (GAUZE/BANDAGES/DRESSINGS) ×3 IMPLANT
BANDAGE ELASTIC 4 VELCRO ST LF (GAUZE/BANDAGES/DRESSINGS) ×3 IMPLANT
BANDAGE ELASTIC 6 VELCRO ST LF (GAUZE/BANDAGES/DRESSINGS) ×3 IMPLANT
BLADE STERNUM SYSTEM 6 (BLADE) ×3 IMPLANT
BNDG GAUZE ELAST 4 BULKY (GAUZE/BANDAGES/DRESSINGS) ×3 IMPLANT
CANISTER SUCT 3000ML PPV (MISCELLANEOUS) ×3 IMPLANT
CATH CPB KIT GERHARDT (MISCELLANEOUS) ×3 IMPLANT
CATH THORACIC 28FR (CATHETERS) ×3 IMPLANT
COVER WAND RF STERILE (DRAPES) ×3 IMPLANT
CRADLE DONUT ADULT HEAD (MISCELLANEOUS) ×3 IMPLANT
DERMABOND ADVANCED (GAUZE/BANDAGES/DRESSINGS) ×1
DERMABOND ADVANCED .7 DNX12 (GAUZE/BANDAGES/DRESSINGS) ×2 IMPLANT
DRAIN CHANNEL 28F RND 3/8 FF (WOUND CARE) ×3 IMPLANT
DRAPE CARDIOVASCULAR INCISE (DRAPES) ×1
DRAPE SLUSH/WARMER DISC (DRAPES) ×3 IMPLANT
DRAPE SRG 135X102X78XABS (DRAPES) ×2 IMPLANT
DRSG AQUACEL AG ADV 3.5X14 (GAUZE/BANDAGES/DRESSINGS) ×3 IMPLANT
ELECT BLADE 4.0 EZ CLEAN MEGAD (MISCELLANEOUS) ×3
ELECT REM PT RETURN 9FT ADLT (ELECTROSURGICAL) ×6
ELECTRODE BLDE 4.0 EZ CLN MEGD (MISCELLANEOUS) ×2 IMPLANT
ELECTRODE REM PT RTRN 9FT ADLT (ELECTROSURGICAL) ×4 IMPLANT
FELT TEFLON 1X6 (MISCELLANEOUS) ×3 IMPLANT
GAUZE SPONGE 4X4 12PLY STRL (GAUZE/BANDAGES/DRESSINGS) ×6 IMPLANT
GAUZE SPONGE 4X4 12PLY STRL LF (GAUZE/BANDAGES/DRESSINGS) ×6 IMPLANT
GLOVE BIO SURGEON STRL SZ 6.5 (GLOVE) ×21 IMPLANT
GOWN STRL REUS W/ TWL LRG LVL3 (GOWN DISPOSABLE) ×20 IMPLANT
GOWN STRL REUS W/TWL LRG LVL3 (GOWN DISPOSABLE) ×10
HEMOSTAT POWDER SURGIFOAM 1G (HEMOSTASIS) ×9 IMPLANT
HEMOSTAT SURGICEL 2X14 (HEMOSTASIS) ×3 IMPLANT
KIT BASIN OR (CUSTOM PROCEDURE TRAY) ×3 IMPLANT
KIT CATH SUCT 8FR (CATHETERS) ×3 IMPLANT
KIT SUCTION CATH 14FR (SUCTIONS) ×6 IMPLANT
KIT TURNOVER KIT B (KITS) ×3 IMPLANT
KIT VASOVIEW HEMOPRO 2 VH 4000 (KITS) ×3 IMPLANT
LEAD PACING MYOCARDI (MISCELLANEOUS) ×3 IMPLANT
MARKER GRAFT CORONARY BYPASS (MISCELLANEOUS) ×9 IMPLANT
NS IRRIG 1000ML POUR BTL (IV SOLUTION) ×18 IMPLANT
PACK E OPEN HEART (SUTURE) ×3 IMPLANT
PACK OPEN HEART (CUSTOM PROCEDURE TRAY) ×3 IMPLANT
PAD ARMBOARD 7.5X6 YLW CONV (MISCELLANEOUS) ×3 IMPLANT
PAD ELECT DEFIB RADIOL ZOLL (MISCELLANEOUS) ×3 IMPLANT
PENCIL BUTTON HOLSTER BLD 10FT (ELECTRODE) ×3 IMPLANT
SET CARDIOPLEGIA MPS 5001102 (MISCELLANEOUS) ×3 IMPLANT
SPONGE LAP 18X18 RF (DISPOSABLE) ×6 IMPLANT
SUT BONE WAX W31G (SUTURE) ×3 IMPLANT
SUT MNCRL AB 4-0 PS2 18 (SUTURE) ×3 IMPLANT
SUT PROLENE 3 0 SH1 36 (SUTURE) ×6 IMPLANT
SUT PROLENE 4 0 TF (SUTURE) ×6 IMPLANT
SUT PROLENE 6 0 CC (SUTURE) ×9 IMPLANT
SUT PROLENE 7 0 BV1 MDA (SUTURE) ×3 IMPLANT
SUT STEEL 6MS V (SUTURE) ×3 IMPLANT
SUT STEEL SZ 6 DBL 3X14 BALL (SUTURE) ×3 IMPLANT
SUT VIC AB 1 CTX 18 (SUTURE) ×6 IMPLANT
SUT VIC AB 2-0 CT1 27 (SUTURE) ×1
SUT VIC AB 2-0 CT1 TAPERPNT 27 (SUTURE) ×2 IMPLANT
SYSTEM SAHARA CHEST DRAIN ATS (WOUND CARE) ×3 IMPLANT
TAPE CLOTH SURG 4X10 WHT LF (GAUZE/BANDAGES/DRESSINGS) ×6 IMPLANT
TAPE PAPER 2X10 WHT MICROPORE (GAUZE/BANDAGES/DRESSINGS) ×6 IMPLANT
TOWEL GREEN STERILE (TOWEL DISPOSABLE) ×3 IMPLANT
TOWEL GREEN STERILE FF (TOWEL DISPOSABLE) ×3 IMPLANT
TRAY FOLEY SLVR 16FR TEMP STAT (SET/KITS/TRAYS/PACK) ×3 IMPLANT
TUBING LAP HI FLOW INSUFFLATIO (TUBING) ×3 IMPLANT
UNDERPAD 30X30 (UNDERPADS AND DIAPERS) ×3 IMPLANT
WATER STERILE IRR 1000ML POUR (IV SOLUTION) ×6 IMPLANT

## 2018-12-08 NOTE — Telephone Encounter (Signed)
Patients wife and updated surgery done and patient in icu stable

## 2018-12-08 NOTE — Procedures (Signed)
Extubation Procedure Note  Patient Details:   Name: Tyler [name] DOB: May 24, 1951 MRN: [id number]   Airway Documentation:    Vent end date: 12/08/18 Vent end time: 1816   Evaluation  O2 sats: stable throughout Complications: No apparent complications Patient did tolerate procedure well. Bilateral Breath Sounds: Clear, Diminished   Yes   Patient was extubated to 2 LPM nasal cannula. Had positive cuff leak. Pt was anxious meds given, NIF was -12 but attempted on vent with trigger of -20 and pt had 500-600 VT. VC was 1L. Patient had a strong productive cough, no stridor noted, BBS clear; diminished. Pt able to say name. Vitals stable. RN at bedside.   Deidrea Gaetz M 12/08/2018, 6:21 PM

## 2018-12-08 NOTE — Brief Op Note (Signed)
12/06/2018 - 12/08/2018  11:53 AM  PATIENT:  Tyler [name]  68 y.o. male  PRE-OPERATIVE DIAGNOSIS:  CAD WITH LEFT MAIN DISEASE  POST-OPERATIVE DIAGNOSIS:  CAD WITH LEFT MAIN DISEASE  PROCEDURE: TRANSESOPHAGEAL ECHOCARDIOGRAM (TEE), MEDIAN STERNOTOMY for CORONARY ARTERY BYPASS GRAFTING (CABG) x 2 (LIMA TO LAD and SVG to OM)  USING LEFT INTERNAL MAMMARY ARTERY AND RIGHT GREATER SAPHENOUS VEIN HARVESTED ENDOSCOPICALLY   SURGEON:  Surgeon(s) and Role:    Grace Isaac, MD - Primary  PHYSICIAN ASSISTANT: Lars Pinks PA-C  ASSISTANTS: Vernie Murders RNFA   ANESTHESIA:   general  EBL:  Per anesthesia and perfusion record   DRAINS: Chest tubes placed in the mediastinal and pleural spaces   COUNTS CORRECT:  YES  DICTATION: .Dragon Dictation  PLAN OF CARE: Admit to inpatient   PATIENT DISPOSITION:  ICU - intubated and hemodynamically stable.   Delay start of Pharmacological VTE agent (>24hrs) due to surgical blood loss or risk of bleeding: yes  BASELINE WEIGHT: 99.8 kg

## 2018-12-08 NOTE — Anesthesia Postprocedure Evaluation (Signed)
Anesthesia Post Note  Patient: Tyler [name]  Procedure(s) Performed: CORONARY ARTERY BYPASS GRAFTING (CABG) x 2, SVG TO  OM1, LIMA TO LAD, USING LEFT INTERNAL MAMMARY ARTERY AND RIGHT GREATER SAPHENOUS VEIN HARVESTED ENDOSCOPICALLY (N/A Chest) TRANSESOPHAGEAL ECHOCARDIOGRAM (TEE) (N/A )     Patient location during evaluation: SICU Anesthesia Type: General Level of consciousness: sedated Pain management: pain level controlled Vital Signs Assessment: post-procedure vital signs reviewed and stable Respiratory status: patient remains intubated per anesthesia plan Cardiovascular status: stable Postop Assessment: no apparent nausea or vomiting Anesthetic complications: no    Last Vitals:  Vitals:   12/08/18 1715 12/08/18 1716  BP:    Pulse: 90 89  Resp: (!) 27 15  Temp: 36.5 C 36.6 C  SpO2: 93% 100%    Last Pain:  Vitals:   12/08/18 1600  TempSrc: Core  PainSc:                  Tiajuana Amass

## 2018-12-08 NOTE — Progress Notes (Signed)
      [address]       [place] [address]             [phone]    Pre Procedure note for inpatients:   Tyler [name] has been scheduled for Procedure(s): CORONARY ARTERY BYPASS GRAFTING (CABG) (N/A) TRANSESOPHAGEAL ECHOCARDIOGRAM (TEE) (N/A) today. The various methods of treatment have been discussed with the patient. After consideration of the risks, benefits and treatment options the patient has consented to the planned procedure.   The patient has been seen and labs reviewed. There are no changes in the patient's condition to prevent proceeding with the planned procedure today.  Recent labs:  Lab Results  Component Value Date   WBC 8.4 12/08/2018   HGB 14.9 12/08/2018   HCT 43.8 12/08/2018   PLT 217 12/08/2018   GLUCOSE 104 (H) 12/08/2018   CHOL 196 12/07/2018   TRIG 182 (H) 12/07/2018   HDL 45 12/07/2018   LDLDIRECT 116.0 07/14/2018   LDLCALC 115 (H) 12/07/2018   ALT 30 12/07/2018   AST 27 12/07/2018   NA 139 12/08/2018   K 4.3 12/08/2018   CL 110 12/08/2018   CREATININE 1.04 12/08/2018   BUN 14 12/08/2018   CO2 21 (L) 12/08/2018   TSH 2.41 07/09/2016   PSA 3.21 07/14/2018   INR 0.9 12/06/2018   HGBA1C 5.5 12/07/2018    Grace Isaac, MD 12/08/2018 8:34 AM

## 2018-12-08 NOTE — Anesthesia Procedure Notes (Signed)
Arterial Line Insertion Start/End4/09/2018 7:45 AM, 12/08/2018 8:00 AM Performed by: Glynda Jaeger, CRNA, CRNA  Preanesthetic checklist: patient identified, IV checked, site marked, risks and benefits discussed, surgical consent, monitors and equipment checked, pre-op evaluation, timeout performed and anesthesia consent Lidocaine 1% used for infiltration and patient sedated Left, radial was placed Catheter size: 20 G Hand hygiene performed  and maximum sterile barriers used  Allen's test indicative of satisfactory collateral circulation Attempts: 2 Procedure performed without using ultrasound guided technique. Following insertion, dressing applied and Biopatch. Post procedure assessment: normal  Patient tolerated the procedure well with no immediate complications.

## 2018-12-08 NOTE — Progress Notes (Signed)
CT surgery p.m. Rounds  Patient hemodynamically stable after multivessel CABG Vent weaning in progress Postop laboratory values satisfactory

## 2018-12-08 NOTE — Transfer of Care (Signed)
Immediate Anesthesia Transfer of Care Note  Patient: Tyler [name]  Procedure(s) Performed: CORONARY ARTERY BYPASS GRAFTING (CABG) x 2, SVG TO  OM1, LIMA TO LAD, USING LEFT INTERNAL MAMMARY ARTERY AND RIGHT GREATER SAPHENOUS VEIN HARVESTED ENDOSCOPICALLY (N/A Chest) TRANSESOPHAGEAL ECHOCARDIOGRAM (TEE) (N/A )  Patient Location: SICU  Anesthesia Type:General  Level of Consciousness: Patient remains intubated per anesthesia plan  Airway & Oxygen Therapy: Patient remains intubated per anesthesia plan and Patient placed on Ventilator (see vital sign flow sheet for setting)  Post-op Assessment: Post -op Vital signs reviewed and stable  Post vital signs: Reviewed and stable  Last Vitals:  Vitals Value Taken Time  BP    Temp 36.1 C 12/08/2018  1:52 PM  Pulse 80 12/08/2018  1:52 PM  Resp 12 12/08/2018  1:52 PM  SpO2 99 % 12/08/2018  1:52 PM  Vitals shown include unvalidated device data.  Last Pain:  Vitals:   12/08/18 0440  TempSrc: Oral  PainSc:          Complications: No apparent anesthesia complications

## 2018-12-08 NOTE — Anesthesia Procedure Notes (Signed)
Procedure Name: Intubation Date/Time: 12/08/2018 8:42 AM Performed by: Glynda Jaeger, CRNA Pre-anesthesia Checklist: Patient identified, Patient being monitored, Timeout performed, Emergency Drugs available and Suction available Patient Re-evaluated:Patient Re-evaluated prior to induction Oxygen Delivery Method: Circle System Utilized Preoxygenation: Pre-oxygenation with 100% oxygen Induction Type: IV induction Laryngoscope Size: Mac and 4 Grade View: Grade III Tube type: Oral Tube size: 8.0 mm Number of attempts: 1 Airway Equipment and Method: Stylet Placement Confirmation: ETT inserted through vocal cords under direct vision,  positive ETCO2 and breath sounds checked- equal and bilateral Secured at: 22 cm Tube secured with: Tape Dental Injury: Teeth and Oropharynx as per pre-operative assessment  Comments: RSI

## 2018-12-08 NOTE — Anesthesia Procedure Notes (Addendum)
Central Venous Catheter Insertion Performed by: Suzette Battiest, MD, anesthesiologist Start/End4/09/2018 7:45 AM, 12/08/2018 8:00 AM Patient location: Pre-op. Preanesthetic checklist: patient identified, IV checked, site marked, risks and benefits discussed, surgical consent, monitors and equipment checked, pre-op evaluation, timeout performed and anesthesia consent Position: Trendelenburg Lidocaine 1% used for infiltration and patient sedated Hand hygiene performed , maximum sterile barriers used  and Seldinger technique used Catheter size: 9 Fr Total catheter length 10. Central line and PA cath was placed.MAC introducer Swan type:thermodilution PA Cath depth:50 Procedure performed using ultrasound guided technique. Ultrasound Notes:anatomy identified, needle tip was noted to be adjacent to the nerve/plexus identified, no ultrasound evidence of intravascular and/or intraneural injection and image(s) printed for medical record Attempts: 1 Following insertion, line sutured, dressing applied and Biopatch. Post procedure assessment: blood return through all ports, free fluid flow and no air  Patient tolerated the procedure well with no immediate complications.

## 2018-12-08 NOTE — Anesthesia Procedure Notes (Addendum)
Central Venous Catheter Insertion Performed by: Suzette Battiest, MD, anesthesiologist Start/End4/09/2018 7:45 AM, 12/08/2018 8:00 AM Patient location: Pre-op. Preanesthetic checklist: patient identified, IV checked, site marked, risks and benefits discussed, surgical consent, monitors and equipment checked, pre-op evaluation, timeout performed and anesthesia consent Hand hygiene performed  and maximum sterile barriers used  PA cath was placed.Swan type:thermodilution PA Cath depth:50 Procedure performed without using ultrasound guided technique. Attempts: 1 Patient tolerated the procedure well with no immediate complications.

## 2018-12-09 ENCOUNTER — Inpatient Hospital Stay (HOSPITAL_COMMUNITY): Payer: Medicare HMO

## 2018-12-09 ENCOUNTER — Encounter (HOSPITAL_COMMUNITY): Payer: Self-pay | Admitting: Cardiothoracic Surgery

## 2018-12-09 DIAGNOSIS — I25110 Atherosclerotic heart disease of native coronary artery with unstable angina pectoris: Secondary | ICD-10-CM

## 2018-12-09 LAB — GLUCOSE, CAPILLARY
Glucose-Capillary: 105 mg/dL — ABNORMAL HIGH (ref 70–99)
Glucose-Capillary: 117 mg/dL — ABNORMAL HIGH (ref 70–99)
Glucose-Capillary: 123 mg/dL — ABNORMAL HIGH (ref 70–99)
Glucose-Capillary: 124 mg/dL — ABNORMAL HIGH (ref 70–99)
Glucose-Capillary: 142 mg/dL — ABNORMAL HIGH (ref 70–99)
Glucose-Capillary: 146 mg/dL — ABNORMAL HIGH (ref 70–99)

## 2018-12-09 LAB — BASIC METABOLIC PANEL
Anion gap: 5 (ref 5–15)
Anion gap: 4 — ABNORMAL LOW (ref 5–15)
BUN: 10 mg/dL (ref 8–23)
BUN: 12 mg/dL (ref 8–23)
CO2: 23 mmol/L (ref 22–32)
CO2: 25 mmol/L (ref 22–32)
Calcium: 7.6 mg/dL — ABNORMAL LOW (ref 8.9–10.3)
Calcium: 8.1 mg/dL — ABNORMAL LOW (ref 8.9–10.3)
Chloride: 107 mmol/L (ref 98–111)
Chloride: 107 mmol/L (ref 98–111)
Creatinine, Ser: 0.86 mg/dL (ref 0.61–1.24)
Creatinine, Ser: 0.86 mg/dL (ref 0.61–1.24)
GFR calc Af Amer: >60 mL/min (ref >60)
GFR calc Af Amer: >60 mL/min (ref >60)
GFR calc non Af Amer: >60 mL/min (ref >60)
GFR calc non Af Amer: >60 mL/min (ref >60)
Glucose, Bld: 122 mg/dL — ABNORMAL HIGH (ref 70–99)
Glucose, Bld: 140 mg/dL — ABNORMAL HIGH (ref 70–99)
Potassium: 4.2 mmol/L (ref 3.5–5.1)
Potassium: 4.2 mmol/L (ref 3.5–5.1)
Sodium: 135 mmol/L (ref 135–145)
Sodium: 136 mmol/L (ref 135–145)

## 2018-12-09 LAB — CBC
HCT: 36.1 % — ABNORMAL LOW (ref 39.0–52.0)
HCT: 34.7 % — ABNORMAL LOW (ref 39.0–52.0)
Hemoglobin: 12 g/dL — ABNORMAL LOW (ref 13.0–17.0)
Hemoglobin: 11.3 g/dL — ABNORMAL LOW (ref 13.0–17.0)
MCH: 30.2 pg (ref 26.0–34.0)
MCH: 30 pg (ref 26.0–34.0)
MCHC: 33.2 g/dL (ref 30.0–36.0)
MCHC: 32.6 g/dL (ref 30.0–36.0)
MCV: 90.7 fL (ref 80.0–100.0)
MCV: 92 fL (ref 80.0–100.0)
Platelets: 176 10*3/uL (ref 150–400)
Platelets: 124 10*3/uL — ABNORMAL LOW (ref 150–400)
RBC: 3.98 MIL/uL — ABNORMAL LOW (ref 4.22–5.81)
RBC: 3.77 MIL/uL — ABNORMAL LOW (ref 4.22–5.81)
RDW: 12.7 % (ref 11.5–15.5)
RDW: 13 % (ref 11.5–15.5)
WBC: 14.4 10*3/uL — ABNORMAL HIGH (ref 4.0–10.5)
WBC: 13.4 10*3/uL — ABNORMAL HIGH (ref 4.0–10.5)
nRBC: 0 % (ref 0.0–0.2)
nRBC: 0 % (ref 0.0–0.2)

## 2018-12-09 LAB — MAGNESIUM
Magnesium: 2.4 mg/dL (ref 1.7–2.4)
Magnesium: 2.2 mg/dL (ref 1.7–2.4)

## 2018-12-09 LAB — POCT I-STAT 7, (LYTES, BLD GAS, ICA,H+H)
Acid-base deficit: 3 mmol/L — ABNORMAL HIGH (ref 0.0–2.0)
Bicarbonate: 24.3 mmol/L (ref 20.0–28.0)
Calcium, Ion: 1.18 mmol/L (ref 1.15–1.40)
HCT: 38 % — ABNORMAL LOW (ref 39.0–52.0)
Hemoglobin: 12.9 g/dL — ABNORMAL LOW (ref 13.0–17.0)
O2 Saturation: 100 %
Patient temperature: 36.9
Potassium: 4.6 mmol/L (ref 3.5–5.1)
Sodium: 141 mmol/L (ref 135–145)
TCO2: 26 mmol/L (ref 22–32)
pCO2 arterial: 49.2 mmHg — ABNORMAL HIGH (ref 32.0–48.0)
pH, Arterial: 7.301 — ABNORMAL LOW (ref 7.350–7.450)
pO2, Arterial: 219 mmHg — ABNORMAL HIGH (ref 83.0–108.0)

## 2018-12-09 NOTE — Progress Notes (Signed)
      [address]       [place] [address]             [phone]      In bed, eating dinner  BP 127/79   Pulse 83   Temp 98.6 F (37 C) (Oral)   Resp (!) 22   Ht 5\' 11"  (1.803 m)   Wt 103.9 kg   SpO2 94%   BMI 31.95 kg/m   959/555  Hct= 35  Doing well POD # 1  Mubashir Mallek C. Roxan Hockey, MD [facility] [phone]

## 2018-12-09 NOTE — Progress Notes (Signed)
    Patient briefly seen and evaluated this morning.  Looks great sitting up in the chair post CABG.  No chest pain or dyspnea.  Just expected postop symptoms.  Currently doing well.  On aspirin and statin and low-dose beta-blocker.   We will continue to follow along peripherally and prior to DC.   Glenetta Hew, MD

## 2018-12-09 NOTE — Progress Notes (Addendum)
[facility] DAILY ICU PROGRESS NOTE                   [address]            [place] [address]          [phone]   1 Day Post-Op Procedure(s) (LRB): CORONARY ARTERY BYPASS GRAFTING (CABG) x 2, SVG TO  OM1, LIMA TO LAD, USING LEFT INTERNAL MAMMARY ARTERY AND RIGHT GREATER SAPHENOUS VEIN HARVESTED ENDOSCOPICALLY (N/A) TRANSESOPHAGEAL ECHOCARDIOGRAM (TEE) (N/A)  Total Length of Stay:  LOS: 3 days   Subjective: Patient awake and alert this am. He does not have any specific complaints.  Objective: Vital signs in last 24 hours: Temp:  [96.8 F (36 C)-100 F (37.8 C)] 100 F (37.8 C) (04/02 0600) Pulse Rate:  [52-92] 89 (04/02 0600) Cardiac Rhythm: Atrial paced;Normal sinus rhythm (04/02 0400) Resp:  [12-29] 23 (04/02 0600) BP: (86-132)/(48-87) 124/48 (04/02 0500) SpO2:  [93 %-100 %] 97 % (04/02 0600) Arterial Line BP: (72-118)/(49-107) 72/63 (04/02 0600) FiO2 (%):  [40 %-50 %] 40 % (04/01 1700) Weight:  [103.9 kg] 103.9 kg (04/02 0500)  Filed Weights   12/07/18 0523 12/08/18 0550 12/09/18 0500  Weight: 100.2 kg 99.8 kg 103.9 kg    Weight change: 4.1 kg   Hemodynamic parameters for last 24 hours: PAP: (16-39)/(3-21) 19/3 CO:  [3.8 L/min-6.5 L/min] 5.2 L/min CI:  [1.7 L/min/m2-3 L/min/m2] 2.3 L/min/m2  Intake/Output from previous day: 04/01 0701 - 04/02 0700 In: 0160.1 [P.O.:120; I.V.:3499; Blood:600; IV Piggyback:750.2] Out: 0932 [Urine:2915; Blood:800; Chest Tube:338]  Intake/Output this shift: No intake/output data recorded.  Current Meds: Scheduled Meds: . acetaminophen  1,000 mg Oral Q6H   Or  . acetaminophen (TYLENOL) oral liquid 160 mg/5 mL  1,000 mg Per Tube Q6H  . aspirin EC  325 mg Oral Daily   Or  . aspirin  324 mg Per Tube Daily  . atorvastatin  80 mg Oral q1800  . bisacodyl  10 mg Oral Daily   Or  . bisacodyl  10 mg Rectal Daily  . Chlorhexidine Gluconate Cloth  6 each Topical Daily  . docusate sodium  200 mg Oral Daily  . insulin  aspart  0-24 Units Subcutaneous Q4H  . mouth rinse  15 mL Mouth Rinse BID  . metoprolol tartrate  12.5 mg Oral BID   Or  . metoprolol tartrate  12.5 mg Per Tube BID  . mupirocin ointment  1 application Nasal BID  . pantoprazole  40 mg Oral Daily  . sodium chloride flush  10-40 mL Intracatheter Q12H   Continuous Infusions: . sodium chloride Stopped (12/08/18 2018)  . sodium chloride    . sodium chloride    . albumin human Stopped (12/09/18 0050)  . dexmedetomidine (PRECEDEX) IV infusion Stopped (12/08/18 1810)  . lactated ringers    . lactated ringers    . lactated ringers 20 mL/hr at 12/09/18 0600  . levofloxacin (LEVAQUIN) IV    . nitroGLYCERIN    . phenylephrine (NEO-SYNEPHRINE) Adult infusion 35 mcg/min (12/09/18 0600)   PRN Meds:.sodium chloride, albumin human, lactated ringers, metoprolol tartrate, midazolam, morphine injection, ondansetron (ZOFRAN) IV, oxyCODONE, sodium chloride flush, sodium chloride flush, traMADol  General appearance: alert, cooperative and no distress Neurologic: intact Heart: RRR Lungs: Diminshed basilar breath sounds Abdomen: Soft, non tender, sporadic bowel sounds present Extremities: Mild LE edema Wound: Aquacel intact and RLE wound is clean and dry  Lab Results: CBC: Recent Labs    12/08/18  2005 12/08/18 2018 12/09/18 0314  WBC 17.6*  --  14.4*  HGB 13.2 12.6* 12.0*  HCT 39.3 37.0* 36.1*  PLT 171  --  176   BMET:  Recent Labs    12/08/18 2005 12/08/18 2018 12/09/18 0314  NA 139 141 135  K 4.4 4.5 4.2  CL 110  --  107  CO2 21*  --  23  GLUCOSE 143*  --  122*  BUN 11  --  10  CREATININE 0.82  --  0.86  CALCIUM 7.9*  --  7.6*    CMET: Lab Results  Component Value Date   WBC 14.4 (H) 12/09/2018   HGB 12.0 (L) 12/09/2018   HCT 36.1 (L) 12/09/2018   PLT 176 12/09/2018   GLUCOSE 122 (H) 12/09/2018   CHOL 196 12/07/2018   TRIG 182 (H) 12/07/2018   HDL 45 12/07/2018   LDLDIRECT 116.0 07/14/2018   LDLCALC 115 (H) 12/07/2018    ALT 30 12/07/2018   AST 27 12/07/2018   NA 135 12/09/2018   K 4.2 12/09/2018   CL 107 12/09/2018   CREATININE 0.86 12/09/2018   BUN 10 12/09/2018   CO2 23 12/09/2018   TSH 2.41 07/09/2016   PSA 3.21 07/14/2018   INR 1.3 (H) 12/08/2018   HGBA1C 5.5 12/07/2018      PT/INR:  Recent Labs    12/08/18 1357  LABPROT 16.3*  INR 1.3*   Radiology: Dg Chest Port 1 View  Result Date: 12/08/2018 CLINICAL DATA:  Status post coronary bypass graft. EXAM: PORTABLE CHEST 1 VIEW COMPARISON:  Radiographs of December 06, 2018. FINDINGS: Stable cardiomediastinal silhouette. Endotracheal tube is seen projected over tracheal air shadow and in grossly good position. Nasogastric tube is seen entering stomach. Right internal jugular Swan-Ganz catheter is noted with tip in expected position of main pulmonary artery. Left-sided chest tube is noted without pneumothorax. Right lung is clear. Bony thorax is unremarkable. IMPRESSION: Left-sided chest tube is noted without definite evidence of pneumothorax. Endotracheal and nasogastric tubes in grossly good position. Electronically Signed   By: Marijo Conception, M.D.   On: 12/08/2018 14:10     Assessment/Plan: S/P Procedure(s) (LRB): CORONARY ARTERY BYPASS GRAFTING (CABG) x 2, SVG TO  OM1, LIMA TO LAD, USING LEFT INTERNAL MAMMARY ARTERY AND RIGHT GREATER SAPHENOUS VEIN HARVESTED ENDOSCOPICALLY (N/A) TRANSESOPHAGEAL ECHOCARDIOGRAM (TEE) (N/A) 1. CV-CO/CI 5.2/2.3. SR. On Neo Synephrine drip and Lopressor (with parameters) 2. Pulmonary-Chest tubes with 338 since surgery. CXR this am appears to show patient is rotated to the right, bibasilar atelectasis and small pleural effusions.  Will remove both tubes this am. Encourage incentive spirometer 3. Volume overload-will diurese once off Neo 4. Expected blood loss anemia-H and H this am 12 and 36.1 5. CBGs 128/134/141. Pre op HGA1C 5.5. Will stop Insulin drip. Will stop accu checks and SS PRN once tolerating po well. 6.  Please see progression orders     Donielle Liston Alba PA-C 12/09/2018 7:09 AM  I have seen and examined [name] and agree with the above assessment  and plan.  Grace Isaac MD Beeper [phone] Office [phone] 12/09/2018 6:40 PM

## 2018-12-10 ENCOUNTER — Inpatient Hospital Stay (HOSPITAL_COMMUNITY): Payer: Medicare HMO

## 2018-12-10 LAB — CBC
HCT: 31 % — ABNORMAL LOW (ref 39.0–52.0)
Hemoglobin: 10.1 g/dL — ABNORMAL LOW (ref 13.0–17.0)
MCH: 30 pg (ref 26.0–34.0)
MCHC: 32.6 g/dL (ref 30.0–36.0)
MCV: 92 fL (ref 80.0–100.0)
Platelets: 106 10*3/uL — ABNORMAL LOW (ref 150–400)
RBC: 3.37 MIL/uL — ABNORMAL LOW (ref 4.22–5.81)
RDW: 13.1 % (ref 11.5–15.5)
WBC: 10.2 10*3/uL (ref 4.0–10.5)
nRBC: 0 % (ref 0.0–0.2)

## 2018-12-10 LAB — BASIC METABOLIC PANEL
Anion gap: 5 (ref 5–15)
BUN: 11 mg/dL (ref 8–23)
CO2: 26 mmol/L (ref 22–32)
Calcium: 8.2 mg/dL — ABNORMAL LOW (ref 8.9–10.3)
Chloride: 105 mmol/L (ref 98–111)
Creatinine, Ser: 0.87 mg/dL (ref 0.61–1.24)
GFR calc Af Amer: >60 mL/min (ref >60)
GFR calc non Af Amer: >60 mL/min (ref >60)
Glucose, Bld: 112 mg/dL — ABNORMAL HIGH (ref 70–99)
Potassium: 4 mmol/L (ref 3.5–5.1)
Sodium: 136 mmol/L (ref 135–145)

## 2018-12-10 LAB — GLUCOSE, CAPILLARY
Glucose-Capillary: 103 mg/dL — ABNORMAL HIGH (ref 70–99)
Glucose-Capillary: 154 mg/dL — ABNORMAL HIGH (ref 70–99)
Glucose-Capillary: 118 mg/dL — ABNORMAL HIGH (ref 70–99)
Glucose-Capillary: 98 mg/dL (ref 70–99)

## 2018-12-10 MED ORDER — MOVING RIGHT ALONG BOOK
Freq: Once | Status: AC
  Administered 2018-12-10: 15:00:00

## 2018-12-10 MED ORDER — SODIUM CHLORIDE 0.9% FLUSH
3.0000 mL | Freq: Two times a day (BID) | INTRAVENOUS | Status: DC
  Administered 2018-12-11 – 2018-12-12 (×3): 3 mL via INTRAVENOUS

## 2018-12-10 MED ORDER — FUROSEMIDE 10 MG/ML IJ SOLN
40.0000 mg | Freq: Once | INTRAMUSCULAR | Status: AC
  Administered 2018-12-10: 40 mg via INTRAVENOUS
  Filled 2018-12-10: qty 4

## 2018-12-10 MED ORDER — POTASSIUM CHLORIDE CRYS ER 20 MEQ PO TBCR
20.0000 meq | EXTENDED_RELEASE_TABLET | Freq: Every day | ORAL | Status: DC
  Administered 2018-12-10 – 2018-12-11 (×2): 20 meq via ORAL
  Filled 2018-12-10 (×3): qty 1

## 2018-12-10 MED ORDER — METOPROLOL TARTRATE 25 MG/10 ML ORAL SUSPENSION
25.0000 mg | Freq: Two times a day (BID) | ORAL | Status: DC
  Filled 2018-12-10 (×4): qty 10

## 2018-12-10 MED ORDER — AMIODARONE HCL IN DEXTROSE 360-4.14 MG/200ML-% IV SOLN
30.0000 mg/h | INTRAVENOUS | Status: DC
  Administered 2018-12-10 (×2): 30 mg/h via INTRAVENOUS
  Filled 2018-12-10: qty 200

## 2018-12-10 MED ORDER — AMIODARONE HCL IN DEXTROSE 360-4.14 MG/200ML-% IV SOLN
60.0000 mg/h | INTRAVENOUS | Status: AC
  Administered 2018-12-10 (×2): 60 mg/h via INTRAVENOUS
  Filled 2018-12-10 (×2): qty 200

## 2018-12-10 MED ORDER — METOPROLOL TARTRATE 25 MG PO TABS
25.0000 mg | ORAL_TABLET | Freq: Two times a day (BID) | ORAL | Status: DC
  Administered 2018-12-10 – 2018-12-13 (×7): 25 mg via ORAL
  Filled 2018-12-10 (×7): qty 1

## 2018-12-10 MED ORDER — AMIODARONE HCL 200 MG PO TABS: 200.0000 mg | ORAL_TABLET | Freq: Every day | ORAL | Status: DC

## 2018-12-10 MED ORDER — FUROSEMIDE 40 MG PO TABS
40.0000 mg | ORAL_TABLET | Freq: Every day | ORAL | Status: DC
  Administered 2018-12-11: 09:00:00 40 mg via ORAL
  Filled 2018-12-10 (×2): qty 1

## 2018-12-10 MED ORDER — SODIUM CHLORIDE 0.9 % IV SOLN: 250.0000 mL | INTRAVENOUS | Status: DC | PRN

## 2018-12-10 MED ORDER — SODIUM CHLORIDE 0.9% FLUSH: 3.0000 mL | INTRAVENOUS | Status: DC | PRN

## 2018-12-10 MED ORDER — AMIODARONE HCL 200 MG PO TABS: 200.0000 mg | ORAL_TABLET | Freq: Two times a day (BID) | ORAL | Status: DC

## 2018-12-10 MED ORDER — AMIODARONE LOAD VIA INFUSION
150.0000 mg | Freq: Once | INTRAVENOUS | Status: AC
  Administered 2018-12-10: 10:00:00 150 mg via INTRAVENOUS
  Filled 2018-12-10: qty 83.34

## 2018-12-10 MED FILL — Dexmedetomidine HCl in NaCl 0.9% IV Soln 400 MCG/100ML: INTRAVENOUS | Qty: 100 | Status: AC

## 2018-12-10 NOTE — Progress Notes (Signed)
  Amiodarone Drug - Drug Interaction Consult Note  Recommendations: Watch HR with concomitant Beta blocker Counsel patients to report any muscle pain or weakness immediately (statin therapy).    Amiodarone is metabolized by the cytochrome P450 system and therefore has the potential to cause many drug interactions. Amiodarone has an average plasma half-life of 50 days (range 20 to 100 days).   There is potential for drug interactions to occur several weeks or months after stopping treatment and the onset of drug interactions may be slow after initiating amiodarone.   []  Statins: Increased risk of myopathy. Simvastatin- restrict dose to 20mg  daily. Other statins: counsel patients to report any muscle pain or weakness immediately.  []  Anticoagulants: Amiodarone can increase anticoagulant effect. Consider warfarin dose reduction. Patients should be monitored closely and the dose of anticoagulant altered accordingly, remembering that amiodarone levels take several weeks to stabilize.  []  Antiepileptics: Amiodarone can increase plasma concentration of phenytoin, the dose should be reduced. Note that small changes in phenytoin dose can result in large changes in levels. Monitor patient and counsel on signs of toxicity.  [x]  Beta blockers: increased risk of bradycardia, AV block and myocardial depression. Sotalol - avoid concomitant use.  []   Calcium channel blockers (diltiazem and verapamil): increased risk of bradycardia, AV block and myocardial depression.  []   Cyclosporine: Amiodarone increases levels of cyclosporine. Reduced dose of cyclosporine is recommended.  []  Digoxin dose should be halved when amiodarone is started.  []  Diuretics: increased risk of cardiotoxicity if hypokalemia occurs.  []  Oral hypoglycemic agents (glyburide, glipizide, glimepiride): increased risk of hypoglycemia. Patient's glucose levels should be monitored closely when initiating amiodarone therapy.   []  Drugs that  prolong the QT interval:  Torsades de pointes risk may be increased with concurrent use - avoid if possible.  Monitor QTc, also keep magnesium/potassium WNL if concurrent therapy can't be avoided. [name] Antibiotics: e.g. fluoroquinolones, erythromycin. . Antiarrhythmics: e.g. quinidine, procainamide, disopyramide, sotalol. . Antipsychotics: e.g. phenothiazines, haloperidol.  . Lithium, tricyclic antidepressants, and methadone. Thank You,   Harrietta Guardian, PharmD PGY1 Pharmacy Resident 12/10/2018    9:59 AM Please check AMION for all [place] numbers

## 2018-12-10 NOTE — Discharge Instructions (Signed)

## 2018-12-10 NOTE — Progress Notes (Addendum)
[facility] DAILY ICU PROGRESS NOTE                   [address]            [place] [address]          [phone]   2 Days Post-Op Procedure(s) (LRB): CORONARY ARTERY BYPASS GRAFTING (CABG) x 2, SVG TO  OM1, LIMA TO LAD, USING LEFT INTERNAL MAMMARY ARTERY AND RIGHT GREATER SAPHENOUS VEIN HARVESTED ENDOSCOPICALLY (N/A) TRANSESOPHAGEAL ECHOCARDIOGRAM (TEE) (N/A)  Total Length of Stay:  LOS: 4 days   Subjective: Patient awake and alert this am. He is sitting in the chair. He is asking for right neck "IV" to come out  Objective: Vital signs in last 24 hours: Temp:  [98 F (36.7 C)-99.7 F (37.6 C)] 98.2 F (36.8 C) (04/03 0400) Pulse Rate:  [72-130] 92 (04/03 0700) Cardiac Rhythm: Normal sinus rhythm (04/03 0400) Resp:  [9-28] 22 (04/03 0700) BP: (96-135)/(58-82) 133/70 (04/03 0700) SpO2:  [91 %-99 %] 92 % (04/03 0700) Weight:  [102.9 kg] 102.9 kg (04/03 0500)  Filed Weights   12/08/18 0550 12/09/18 0500 12/10/18 0500  Weight: 99.8 kg 103.9 kg 102.9 kg    Weight change: -1 kg   Hemodynamic parameters for last 24 hours:    Intake/Output from previous day: 04/02 0701 - 04/03 0700 In: 1179.7 [P.O.:910; I.V.:119.6; IV Piggyback:150] Out: 1330 [Urine:1330]  Intake/Output this shift: No intake/output data recorded.  Current Meds: Scheduled Meds: . acetaminophen  1,000 mg Oral Q6H   Or  . acetaminophen (TYLENOL) oral liquid 160 mg/5 mL  1,000 mg Per Tube Q6H  . aspirin EC  325 mg Oral Daily   Or  . aspirin  324 mg Per Tube Daily  . atorvastatin  80 mg Oral q1800  . bisacodyl  10 mg Oral Daily   Or  . bisacodyl  10 mg Rectal Daily  . Chlorhexidine Gluconate Cloth  6 each Topical Daily  . docusate sodium  200 mg Oral Daily  . mouth rinse  15 mL Mouth Rinse BID  . metoprolol tartrate  12.5 mg Oral BID   Or  . metoprolol tartrate  12.5 mg Per Tube BID  . mupirocin ointment  1 application Nasal BID  . pantoprazole  40 mg Oral Daily  . sodium chloride  flush  10-40 mL Intracatheter Q12H   Continuous Infusions: . sodium chloride Stopped (12/08/18 2018)  . sodium chloride    . sodium chloride    . dexmedetomidine (PRECEDEX) IV infusion Stopped (12/08/18 1810)  . lactated ringers    . lactated ringers    . lactated ringers Stopped (12/09/18 1300)  . nitroGLYCERIN    . phenylephrine (NEO-SYNEPHRINE) Adult infusion Stopped (12/09/18 0846)   PRN Meds:.sodium chloride, lactated ringers, metoprolol tartrate, midazolam, morphine injection, ondansetron (ZOFRAN) IV, oxyCODONE, sodium chloride flush, sodium chloride flush, traMADol  General appearance: alert, cooperative and no distress Heart: RRR Lungs: Slightly diminshed basilar breath sounds Abdomen: Soft, non tender, sporadic bowel sounds present Extremities: Mild LE edema Wound: Aquacel intact and RLE wound is clean and dry  Lab Results: CBC: Recent Labs    12/09/18 1708 12/10/18 0441  WBC 13.4* 10.2  HGB 11.3* 10.1*  HCT 34.7* 31.0*  PLT 124* 106*   BMET:  Recent Labs    12/09/18 1708 12/10/18 0441  NA 136 136  K 4.2 4.0  CL 107 105  CO2 25 26  GLUCOSE 140* 112*  BUN 12 11  CREATININE 0.86 0.87  CALCIUM 8.1* 8.2*    CMET: Lab Results  Component Value Date   WBC 10.2 12/10/2018   HGB 10.1 (L) 12/10/2018   HCT 31.0 (L) 12/10/2018   PLT 106 (L) 12/10/2018   GLUCOSE 112 (H) 12/10/2018   CHOL 196 12/07/2018   TRIG 182 (H) 12/07/2018   HDL 45 12/07/2018   LDLDIRECT 116.0 07/14/2018   LDLCALC 115 (H) 12/07/2018   ALT 30 12/07/2018   AST 27 12/07/2018   NA 136 12/10/2018   K 4.0 12/10/2018   CL 105 12/10/2018   CREATININE 0.87 12/10/2018   BUN 11 12/10/2018   CO2 26 12/10/2018   TSH 2.41 07/09/2016   PSA 3.21 07/14/2018   INR 1.3 (H) 12/08/2018   HGBA1C 5.5 12/07/2018      PT/INR:  Recent Labs    12/08/18 1357  LABPROT 16.3*  INR 1.3*   Radiology: No results found.   Assessment/Plan: S/P Procedure(s) (LRB): CORONARY ARTERY BYPASS GRAFTING  (CABG) x 2, SVG TO  OM1, LIMA TO LAD, USING LEFT INTERNAL MAMMARY ARTERY AND RIGHT GREATER SAPHENOUS VEIN HARVESTED ENDOSCOPICALLY (N/A) TRANSESOPHAGEAL ECHOCARDIOGRAM (TEE) (N/A)   1. CV- SR. On Lopressor 12.5 mg bid but will increase for better HR and BP control.  Will consider low dose ACE in am if BP will tolerate 2. Pulmonary-On room air this am. CXR this am appears stable ( to show patient is rotated to the right, bibasilar atelectasis and small pleural effusions.)  Will remove both tubes this am. Encourage incentive spirometer 3. Volume overload-will give Lasix 40 mg IV today 4. Expected blood loss anemia-H and H this am 10.1 and 31 5. CBGs 123/105/103. Pre op HGA1C 5.5. Stop accu checks and SS PRN  6. Thrombocytopenia-platelets this am 106,000 7. Remove EPW in am 8. Remove right IJ transducer 9. Transfer to 4E    Donielle Liston Alba PA-C 12/10/2018 7:04 AM   patient walked around unit this am  To stepdown today poss home Sunday, Monday I have seen and examined [name] and agree with the above assessment  and plan.  Grace Isaac MD Beeper [phone] Office [phone] 12/10/2018 7:52 AM

## 2018-12-10 NOTE — Discharge Summary (Addendum)
Physician Discharge Summary       [address]       [place] [address]             [phone]    Patient ID: MARIAN GRANDT MRN: [id number] DOB/AGE: 15-Apr-1951 68 y.o.  Admit date: 12/06/2018 Discharge date: 12/13/2018  Admission Diagnoses: 1. NSTEMI (non-ST elevated myocardial infarction) ([place]) 2. CAD (coronary artery disease)  Discharge Diagnoses:  1. S/p CABG x 2 2. Expected ABL anemia 3. History of hyperlipidemia 4. History of IFG (impaired fasting glucose) 5. History of GERD (gastroesophageal reflux disease) 6. History of BPH (benign prostatic hypertrophy) 7. History of chronic pain of right knee 8. History of erectile dysfunction 9. History of colon polyps 10. History of obesity  Procedure (s):  Jettie Booze, MD (Primary)    Procedures   LEFT HEART CATH AND CORONARY ANGIOGRAPHY by Dr. Irish Lack on 12/07/2018:  Conclusion     Ost LM lesion is 75% stenosed.  Prox Cx lesion is 90% stenosed.  Prox LAD to Mid LAD lesion is 25% stenosed.  The left ventricular systolic function is normal.  LV end diastolic pressure is normal. LVEDP 10 mm Hg.  The left ventricular ejection fraction is 35-45% by visual estimate.  There is no aortic valve stenosis.   Ulcerated lesions in the ostial left main and proximal circumflex.  Will plan for surgery consult.    Anterior hypokinesis from stunned myocardium.  I suspect LV function would return with revascularization.   D/w Dr. Ellyn Hack.  Will move to 2 Heart.  Restart heparin.     CABG x 2 (LIMA to LAD and SVG to OM) using left internal mammary artery and right greater saphenous vein harvested endoscopically by Dr. Servando Snare on 12/08/2018.  History of Presenting Illness: The patient is a 68 year old male who presented on 12/06/2018 to the emergency department with a chief complaint of chest pain.  The patient describes intermittent substernal chest discomfort radiating to both arms  episodically for the past 2 weeks.  He would primarily in the right arm and when chest pain dissipated he would continue to have right bicep region pain for a couple minutes longer.  The has had both exertional and rest symptoms.  At the time of presentation he had an episode in which she was going to go for a walk but almost immediately had significant chest discomfort that stopped after resting approximately 5 minutes.  He had additional episodes and then presented to the emergency department for further evaluation and treatment.  His initial ECG showed mild inferolateral ST depression.  His initial troponin I was 0.19.  Peak troponin was 0.44.  He was started on intravenous heparin and admitted to [facility] for further evaluation and treatment to include cardiac catheterization.  Catheterization revealed significant left main as well as circumflex disease.  Please see the full report details below.  We are asked to see the patient in cardiothoracic surgical consultation to evaluate candidacy for coronary artery surgical revascularization.  The patient does have a cardiac risk factors including hyperlipidemia and erectile dysfunction.  He has been on Pravachol therapy.  His most recent lipid study done 12/07/2018 shows a poor triglyceride to HDL ratio with an LDL of 115.  He has no history of tobacco abuse.  He does not have a history of diabetes but he states he has been "borderline" for a few years.[name]  He is obese with a BMI of 30.82. He is on CPAP for sleep  apnea but says he has not used in approximately past 6 months due to discomfort related to the mask.  Brief Hospital Course:  The patient was extubated the evening of surgery without difficulty. He remained afebrile and hemodynamically stable. He was weaned off Neo Synephrine drip. Gordy Councilman, a line, chest tubes, and foley were removed early in the post operative course. Lopressor was started and titrated accordingly. As discussed with Dr. Servando Snare,  enteric coated aspirin was decreased to 81 mg and he was started on Plavix 75 mg daily. He was volume over loaded and diuresed. He had ABL anemia. He did not require a post op transfusion. Last H and H was 10.3/31. He was weaned off the insulin drip.  He has a history of IFG (impaired fasting glucose)   The patient's glucose remained well controlled. The patient's HGA1C pre op was 5.5.  The patient developed Atrial Fibrillation.  He was treated with IV Amiodarone with successful conversion to NSR.  This was transitioned to an oral regimen of Amiodarone. The patient was felt surgically stable for transfer from the ICU to PCTU for further convalescence on 04/04. He continues to progress with cardiac rehab. He was ambulating on room air. He has been tolerating a diet and has had a bowel movement. Epicardial pacing wires were removed on 04/05. Chest tube sutures will be removed the day of discharge. The patient is felt surgically stable for discharge today.   Latest Vital Signs: Blood pressure 127/81, pulse 67, temperature 98.2 F (36.8 C), temperature source Oral, resp. rate 20, height 5\' 11"  (1.803 m), weight 99.5 kg, SpO2 97 %.  Physical Exam: Cardiovascular: RRR Pulmonary: Slightly diminished bibasilar breath sounds Abdomen: Soft, non tender, bowel sounds present. Extremities: Mild bilateral lower extremity edema. Ecchymosis right thigh. Wounds: Clean and dry.  No erythema or signs of infection. RLE wounds are clean and dry.  Discharge Condition:Stable and discharged to home.  Recent laboratory studies:  Lab Results  Component Value Date   WBC 7.3 12/12/2018   HGB 10.3 (L) 12/12/2018   HCT 30.1 (L) 12/12/2018   MCV 91.5 12/12/2018   PLT 173 12/12/2018   Lab Results  Component Value Date   NA 139 12/12/2018   K 3.9 12/12/2018   CL 107 12/12/2018   CO2 26 12/12/2018   CREATININE 1.03 12/12/2018   GLUCOSE 96 12/12/2018   Diagnostic Studies: Dg Chest 2 View  Result Date: 12/11/2018  CLINICAL DATA:  Followup pleural effusion. CABG surgery on 12/08/2018. EXAM: CHEST - 2 VIEW COMPARISON:  12/10/2018 and older studies. FINDINGS: Bilateral pleural effusions are similar to the prior exam. Lung base atelectasis has improved. Remainder of the lungs is clear. No pneumothorax. Right internal jugular introducer sheath has been removed. There Is No remaining support apparatus. Cardiac silhouette is normal in size.  No mediastinal widening. IMPRESSION: 1. No acute findings or evidence of an operative complication. 2. Improved lung base atelectasis. Persistent small pleural effusions. No evidence of pulmonary edema, mediastinal widening or of a pneumothorax. Electronically Signed   By: Lajean Manes M.D.   On: 12/11/2018 06:06    Vas US Doppler Pre Cabg  Result Date: 12/08/2018 PREOPERATIVE VASCULAR EVALUATION  Indications: PRE CABG. Performing Technologist: Abram Sander RVS  Examination Guidelines: A complete evaluation includes B-mode imaging, spectral Doppler, color Doppler, and power Doppler as needed of all accessible portions of each vessel. Bilateral testing is considered an integral part of a complete examination. Limited examinations for reoccurring indications may be performed as  noted.  Right Carotid Findings: +----------+--------+--------+--------+-----------+--------+           PSV cm/sEDV cm/sStenosisDescribe   Comments +----------+--------+--------+--------+-----------+--------+ CCA Prox  88      9               homogeneous         +----------+--------+--------+--------+-----------+--------+ CCA Distal90      19              homogeneous         +----------+--------+--------+--------+-----------+--------+ ICA Prox  80      23      40-59%  homogeneous         +----------+--------+--------+--------+-----------+--------+ ICA Distal85      28                                  +----------+--------+--------+--------+-----------+--------+ ECA       97                                           +----------+--------+--------+--------+-----------+--------+ Portions of this table do not appear on this page. +----------+--------+-------+--------+------------+           PSV cm/sEDV cmsDescribeArm Pressure +----------+--------+-------+--------+------------+ Subclavian77                     135          +----------+--------+-------+--------+------------+ +---------+--------+--+--------+--+---------+ VertebralPSV cm/s88EDV cm/s20Antegrade +---------+--------+--+--------+--+---------+ Left Carotid Findings: +----------+--------+--------+--------+-----------+--------+           PSV cm/sEDV cm/sStenosisDescribe   Comments +----------+--------+--------+--------+-----------+--------+ CCA Prox  138     22              homogeneous         +----------+--------+--------+--------+-----------+--------+ CCA Distal104     24              homogeneous         +----------+--------+--------+--------+-----------+--------+ ICA Prox  67      24      1-39%   homogeneous         +----------+--------+--------+--------+-----------+--------+ ICA Distal65      21                                  +----------+--------+--------+--------+-----------+--------+ ECA       68      7                                   +----------+--------+--------+--------+-----------+--------+ +----------+--------+--------+--------+------------+ SubclavianPSV cm/sEDV cm/sDescribeArm Pressure +----------+--------+--------+--------+------------+           104                     128          +----------+--------+--------+--------+------------+ +---------+--------+--+--------+-+---------+ VertebralPSV cm/s37EDV cm/s9Antegrade +---------+--------+--+--------+-+---------+  ABI Findings: +--------+------------------+-----+---------+--------+ Right   Rt Pressure (mmHg)IndexWaveform Comment  +--------+------------------+-----+---------+--------+  [id number]                    triphasic         +--------+------------------+-----+---------+--------+ PTA                            triphasic         +--------+------------------+-----+---------+--------+  DP                             triphasic         +--------+------------------+-----+---------+--------+ +--------+------------------+-----+---------+-------+ Left    Lt Pressure (mmHg)IndexWaveform Comment +--------+------------------+-----+---------+-------+ [id number]                    triphasic        +--------+------------------+-----+---------+-------+ PTA                            triphasic        +--------+------------------+-----+---------+-------+ DP                             triphasic        +--------+------------------+-----+---------+-------+  Right Doppler Findings: +--------+--------+-----+---------+--------+ Site    PressureIndexDoppler  Comments +--------+--------+-----+---------+--------+ [id number]          triphasic         +--------+--------+-----+---------+--------+ Radial               triphasic         +--------+--------+-----+---------+--------+ Ulnar                triphasic         +--------+--------+-----+---------+--------+  Left Doppler Findings: +--------+--------+-----+---------+--------+ Site    PressureIndexDoppler  Comments +--------+--------+-----+---------+--------+ [id number]          triphasic         +--------+--------+-----+---------+--------+ Radial               triphasic         +--------+--------+-----+---------+--------+ Ulnar                triphasic         +--------+--------+-----+---------+--------+  Summary: Right Carotid: Velocities in the right ICA are consistent with a 1-39% stenosis. Left Carotid: Velocities in the left ICA are consistent with a 1-39% stenosis. Vertebrals: Bilateral vertebral arteries demonstrate antegrade flow. Right Upper Extremity: Unable to  obtain Palmer Arch due to TR band. Left Upper Extremity: Doppler waveforms remain within normal limits with left radial compression. Doppler waveforms remain within normal limits with left ulnar compression.  Electronically signed by Servando Snare MD on 12/08/2018 at 1:04:35 PM.    Final    Discharge Instructions    Amb Referral to Cardiac Rehabilitation   Complete by:  As directed    Due to the safety and concern for the health and well-being of our patients during the National Pandemic Covid-19, the Cardiac rehab department staff are unable to provide face to face cardiac rehab phase 1 interaction through approximately April 6h. However, these patients who are affected will be contacted upon discharge at a later date.  Patients will be provided education on Nutrition, Individualized plan for Exercise and risk factor modification.  A referral to Outpatient Cardiac Rehab program will be provided.  Currently the [facility] Outpatient Cardiac Rehab program is closed to patients for group exercise.  Patients will be contacted for scheduling when permitted.  Maurice Small RN, BSN Cardiac and Pulmonary Rehab Nurse Navigator     Diagnosis:   CABG NSTEMI     CABG X ___:  2      Discharge Medications: Allergies as of 12/13/2018      Reactions   Sulfonamide Derivatives    REACTION: rash Because of a history of documented adverse  serious drug reaction;Medi Alert bracelet  is recommended      Medication List    STOP taking these medications   Advil 200 MG tablet Generic drug:  ibuprofen   pravastatin 40 MG tablet Commonly known as:  PRAVACHOL     TAKE these medications   amiodarone 200 MG tablet Commonly known as:  PACERONE Take 1 tablet (200 mg total) by mouth every 12 (twelve) hours. For one week then take 200 mg daily thereafter   aspirin 325 MG EC tablet Take 1 tablet (325 mg total) by mouth daily. What changed:    medication strength  how much to take   atorvastatin 80 MG  tablet Commonly known as:  LIPITOR Take 1 tablet (80 mg total) by mouth daily at 6 PM.   furosemide 40 MG tablet Commonly known as:  LASIX Take 1 tablet (40 mg total) by mouth daily. For 4 days then stop.   metoprolol tartrate 25 MG tablet Commonly known as:  LOPRESSOR Take 1 tablet (25 mg total) by mouth 2 (two) times daily.   omeprazole 20 MG capsule Commonly known as:  PRILOSEC Take 1 capsule (20 mg total) by mouth daily.   OVER THE COUNTER MEDICATION Take 1 capsule by mouth 2 (two) times daily. Super Beta Prostate -   potassium chloride SA 20 MEQ tablet Commonly known as:  K-DUR,KLOR-CON Take 1 tablet (20 mEq total) by mouth daily. For 4 days then stop   traMADol 50 MG tablet Commonly known as:  ULTRAM Take 1 tablet (50 mg total) by mouth every 4 (four) hours as needed for moderate pain.      The patient has been discharged on:   1.Beta Blocker:  Yes [ x  ]                              No   [   ]                              If No, reason:  2.Ace Inhibitor/ARB: Yes [   ]                                     No  [ x   ]                                     If No, reason:Labile BP  3.Statin:   Yes [ x  ]                  No  [   ]                  If No, reason:  4.Ecasa:  Yes  [  x ]                  No   [   ]                  If No, reason:  Follow Up Appointments: Follow-up Information    Grace Isaac, MD. Go on 01/13/2019.   Specialty:  Cardiothoracic Surgery Why:  PA/LAT CXR to be taken (at [facility] which is in the same building  as Dr. Everrett Coombe office) on 05/07 at 11:30 am;Appointment time is at 12:00 pm Contact information: [place] [address] [phone]        Lendon Colonel, NP. Go on 12/30/2018.   Specialties:  Nurse Practitioner, Radiology, Cardiology Why:  Appointment time is at 8:30 am Contact information: [address] [place] [address] [phone]            Signed: Sharalyn Ink [facility] 12/13/2018, 9:39 AM

## 2018-12-10 NOTE — Progress Notes (Signed)
Patient returning from ambulating to the bathroom when his HR was noted to be 140-150, afib with rvr. Patient asymptomatic. PO metoprolol had been given about 15 minutes prior. Dr. Servando Snare notified. New orders received. Transfer to 4E cancelled. Will monitor.  Joellen Jersey, RN

## 2018-12-10 NOTE — Plan of Care (Signed)
  Problem: Education: ?Goal: Will demonstrate proper wound care and an understanding of methods to prevent future damage ?Outcome: Progressing ?Goal: Knowledge of disease or condition will improve ?Outcome: Progressing ?Goal: Knowledge of the prescribed therapeutic regimen will improve ?Outcome: Progressing ?  ?Problem: Activity: ?Goal: Risk for activity intolerance will decrease ?Outcome: Progressing ?  ?Problem: Cardiac: ?Goal: Will achieve and/or maintain hemodynamic stability ?Outcome: Progressing ?  ?Problem: Clinical Measurements: ?Goal: Postoperative complications will be avoided or minimized ?Outcome: Progressing ?  ?Problem: Respiratory: ?Goal: Respiratory status will improve ?Outcome: Progressing ?  ?Problem: Skin Integrity: ?Goal: Wound healing without signs and symptoms of infection ?Outcome: Progressing ?Goal: Risk for impaired skin integrity will decrease ?Outcome: Progressing ?  ?Problem: Urinary Elimination: ?Goal: Ability to achieve and maintain adequate renal perfusion and functioning will improve ?Outcome: Progressing ?  ?

## 2018-12-11 ENCOUNTER — Inpatient Hospital Stay (HOSPITAL_COMMUNITY): Payer: Medicare HMO

## 2018-12-11 LAB — BASIC METABOLIC PANEL
Anion gap: 7 (ref 5–15)
BUN: 13 mg/dL (ref 8–23)
CO2: 26 mmol/L (ref 22–32)
Calcium: 8.4 mg/dL — ABNORMAL LOW (ref 8.9–10.3)
Chloride: 105 mmol/L (ref 98–111)
Creatinine, Ser: 0.96 mg/dL (ref 0.61–1.24)
GFR calc Af Amer: >60 mL/min (ref >60)
GFR calc non Af Amer: >60 mL/min (ref >60)
Glucose, Bld: 108 mg/dL — ABNORMAL HIGH (ref 70–99)
Potassium: 3.9 mmol/L (ref 3.5–5.1)
Sodium: 138 mmol/L (ref 135–145)

## 2018-12-11 LAB — CBC
HCT: 29.6 % — ABNORMAL LOW (ref 39.0–52.0)
Hemoglobin: 10.1 g/dL — ABNORMAL LOW (ref 13.0–17.0)
MCH: 31.1 pg (ref 26.0–34.0)
MCHC: 34.1 g/dL (ref 30.0–36.0)
MCV: 91.1 fL (ref 80.0–100.0)
Platelets: 130 10*3/uL — ABNORMAL LOW (ref 150–400)
RBC: 3.25 MIL/uL — ABNORMAL LOW (ref 4.22–5.81)
RDW: 13.2 % (ref 11.5–15.5)
WBC: 9.5 10*3/uL (ref 4.0–10.5)
nRBC: 0 % (ref 0.0–0.2)

## 2018-12-11 MED ORDER — AMIODARONE HCL 200 MG PO TABS: 200.0000 mg | ORAL_TABLET | Freq: Two times a day (BID) | ORAL | Status: DC

## 2018-12-11 MED ORDER — AMIODARONE HCL 200 MG PO TABS
200.0000 mg | ORAL_TABLET | Freq: Two times a day (BID) | ORAL | Status: DC
  Administered 2018-12-11 – 2018-12-13 (×5): 200 mg via ORAL
  Filled 2018-12-11 (×5): qty 1

## 2018-12-11 MED ORDER — SORBITOL 70 % PO SOLN
30.0000 mL | Freq: Once | ORAL | Status: AC
  Administered 2018-12-11: 30 mL via ORAL
  Filled 2018-12-11: qty 30

## 2018-12-11 NOTE — Progress Notes (Signed)
3 Days Post-Op Procedure(s) (LRB): CORONARY ARTERY BYPASS GRAFTING (CABG) x 2, SVG TO  OM1, LIMA TO LAD, USING LEFT INTERNAL MAMMARY ARTERY AND RIGHT GREATER SAPHENOUS VEIN HARVESTED ENDOSCOPICALLY (N/A) TRANSESOPHAGEAL ECHOCARDIOGRAM (TEE) (N/A) Subjective: Patient has converted to normal sinus rhythm on IV amiodarone Amiodarone has been transitioned to oral dosing Patient waiting for transfer to progressive care unit Chest x-ray is clear, patient ambulating well.  Objective: Vital signs in last 24 hours: Temp:  [98.1 F (36.7 C)-98.9 F (37.2 C)] 98.9 F (37.2 C) (04/04 0738) Pulse Rate:  [64-78] 75 (04/04 0900) Cardiac Rhythm: Normal sinus rhythm (04/04 0700) Resp:  [16-25] 20 (04/04 0900) BP: (85-123)/(60-87) 117/72 (04/04 0900) SpO2:  [93 %-98 %] 96 % (04/04 0900)  Hemodynamic parameters for last 24 hours:   Stable Intake/Output from previous day: 04/03 0701 - 04/04 0700 In: 1492.8 [P.O.:960; I.V.:532.8] Out: 975 [Urine:975] Intake/Output this shift: Total I/O In: 33.3 [I.V.:33.3] Out: 75 [Urine:75]       Exam    General- alert and comfortable    Neck- no JVD, no cervical adenopathy palpable, no carotid bruit   Lungs- clear without rales, wheezes   Cor- regular rate and rhythm, no murmur , gallop   Abdomen- soft, non-tender   Extremities - warm, non-tender, minimal edema   Neuro- oriented, appropriate, no focal weakness   Lab Results: Recent Labs    12/10/18 0441 12/11/18 0504  WBC 10.2 9.5  HGB 10.1* 10.1*  HCT 31.0* 29.6*  PLT 106* 130*   BMET:  Recent Labs    12/10/18 0441 12/11/18 0504  NA 136 138  K 4.0 3.9  CL 105 105  CO2 26 26  GLUCOSE 112* 108*  BUN 11 13  CREATININE 0.87 0.96  CALCIUM 8.2* 8.4*    PT/INR:  Recent Labs    12/08/18 1357  LABPROT 16.3*  INR 1.3*   ABG    Component Value Date/Time   PHART 7.312 (L) 12/08/2018 2018   HCO3 22.4 12/08/2018 2018   TCO2 24 12/08/2018 2018   ACIDBASEDEF 4.0 (H) 12/08/2018 2018   O2SAT 97.0 12/08/2018 2018   CBG (last 3)  Recent Labs    12/10/18 0757 12/10/18 1156 12/10/18 1548  GLUCAP 154* 118* 98    Assessment/Plan: S/P Procedure(s) (LRB): CORONARY ARTERY BYPASS GRAFTING (CABG) x 2, SVG TO  OM1, LIMA TO LAD, USING LEFT INTERNAL MAMMARY ARTERY AND RIGHT GREATER SAPHENOUS VEIN HARVESTED ENDOSCOPICALLY (N/A) TRANSESOPHAGEAL ECHOCARDIOGRAM (TEE) (N/A) Mobilize Diuresis Plan for transfer to step-down: see transfer orders Hold oral anticoagulation for postop A. fib for now  LOS: 5 days    Tyler [name] 12/11/2018

## 2018-12-12 LAB — BASIC METABOLIC PANEL
Anion gap: 6 (ref 5–15)
BUN: 14 mg/dL (ref 8–23)
CO2: 26 mmol/L (ref 22–32)
Calcium: 8.6 mg/dL — ABNORMAL LOW (ref 8.9–10.3)
Chloride: 107 mmol/L (ref 98–111)
Creatinine, Ser: 1.03 mg/dL (ref 0.61–1.24)
GFR calc Af Amer: >60 mL/min (ref >60)
GFR calc non Af Amer: >60 mL/min (ref >60)
Glucose, Bld: 96 mg/dL (ref 70–99)
Potassium: 3.9 mmol/L (ref 3.5–5.1)
Sodium: 139 mmol/L (ref 135–145)

## 2018-12-12 LAB — CBC
HCT: 30.1 % — ABNORMAL LOW (ref 39.0–52.0)
Hemoglobin: 10.3 g/dL — ABNORMAL LOW (ref 13.0–17.0)
MCH: 31.3 pg (ref 26.0–34.0)
MCHC: 34.2 g/dL (ref 30.0–36.0)
MCV: 91.5 fL (ref 80.0–100.0)
Platelets: 173 10*3/uL (ref 150–400)
RBC: 3.29 MIL/uL — ABNORMAL LOW (ref 4.22–5.81)
RDW: 13.2 % (ref 11.5–15.5)
WBC: 7.3 10*3/uL (ref 4.0–10.5)
nRBC: 0 % (ref 0.0–0.2)

## 2018-12-12 NOTE — Progress Notes (Signed)
Pt ambulated around unit x 470 feet will continue to monitor

## 2018-12-12 NOTE — Progress Notes (Signed)
4 Days Post-Op Procedure(s) (LRB): CORONARY ARTERY BYPASS GRAFTING (CABG) x 2, SVG TO  OM1, LIMA TO LAD, USING LEFT INTERNAL MAMMARY ARTERY AND RIGHT GREATER SAPHENOUS VEIN HARVESTED ENDOSCOPICALLY (N/A) TRANSESOPHAGEAL ECHOCARDIOGRAM (TEE) (N/A) Subjective: Maintaining sinus rhythm on oral amiodarone We will remove EP W's today Prepare for discharge Monday a.m.  Objective: Vital signs in last 24 hours: Temp:  [97.9 F (36.6 C)-99 F (37.2 C)] 98.3 F (36.8 C) (04/05 1123) Pulse Rate:  [67-85] 74 (04/05 1123) Cardiac Rhythm: Normal sinus rhythm (04/05 0800) Resp:  [11-21] 17 (04/05 1123) BP: (107-135)/(61-76) 129/76 (04/05 1106) SpO2:  [95 %-99 %] 96 % (04/05 1123) Weight:  [99.2 kg] 99.2 kg (04/05 0514)  Hemodynamic parameters for last 24 hours:    Intake/Output from previous day: 04/04 0701 - 04/05 0700 In: 513.3 [P.O.:480; I.V.:33.3] Out: 775 [Urine:775] Intake/Output this shift: Total I/O In: 250 [P.O.:250] Out: -   Exam  Alert and comfortable Lungs clear Sternal incision clean and dry Abdomen soft Extremities warm with minimal edema  Lab Results: Recent Labs    12/11/18 0504 12/12/18 0405  WBC 9.5 7.3  HGB 10.1* 10.3*  HCT 29.6* 30.1*  PLT 130* 173   BMET:  Recent Labs    12/11/18 0504 12/12/18 0405  NA 138 139  K 3.9 3.9  CL 105 107  CO2 26 26  GLUCOSE 108* 96  BUN 13 14  CREATININE 0.96 1.03  CALCIUM 8.4* 8.6*    PT/INR: No results for input(s): LABPROT, INR in the last 72 hours. ABG    Component Value Date/Time   PHART 7.312 (L) 12/08/2018 2018   HCO3 22.4 12/08/2018 2018   TCO2 24 12/08/2018 2018   ACIDBASEDEF 4.0 (H) 12/08/2018 2018   O2SAT 97.0 12/08/2018 2018   CBG (last 3)  Recent Labs    12/10/18 0757 12/10/18 1156 12/10/18 1548  GLUCAP 154* 118* 98    Assessment/Plan: S/P Procedure(s) (LRB): CORONARY ARTERY BYPASS GRAFTING (CABG) x 2, SVG TO  OM1, LIMA TO LAD, USING LEFT INTERNAL MAMMARY ARTERY AND RIGHT GREATER  SAPHENOUS VEIN HARVESTED ENDOSCOPICALLY (N/A) TRANSESOPHAGEAL ECHOCARDIOGRAM (TEE) (N/A) Mobilize Diuresis d/c pacing wires Plan for discharge: see discharge orders DC in a.m. Monday  LOS: 6 days    Tharon Aquas Trigt III 12/12/2018

## 2018-12-12 NOTE — Progress Notes (Signed)
Removed epicardial wires per order. 3 intact.  Pt tolerated procedure well.  Pt instructed to remain on bedrest for one hour.  Frequent vitals will be taken and documented. Pt resting with call bell within reach. Dani Danis McClintock, RN   

## 2018-12-12 NOTE — Progress Notes (Signed)
Pt ambulated around the unit x 470 feet pt tolerated well

## 2018-12-13 MED ORDER — AMIODARONE HCL 200 MG PO TABS: 200.0000 mg | ORAL_TABLET | Freq: Two times a day (BID) | ORAL | 1 refills | Status: DC

## 2018-12-13 MED ORDER — FUROSEMIDE 40 MG PO TABS
40.0000 mg | ORAL_TABLET | Freq: Every day | ORAL | Status: DC
  Administered 2018-12-13: 40 mg via ORAL
  Filled 2018-12-13: qty 1

## 2018-12-13 MED ORDER — ATORVASTATIN CALCIUM 80 MG PO TABS: 80.0000 mg | ORAL_TABLET | Freq: Every day | ORAL | 0 refills | Status: DC

## 2018-12-13 MED ORDER — METOPROLOL TARTRATE 25 MG PO TABS: 25.0000 mg | ORAL_TABLET | Freq: Two times a day (BID) | ORAL | 1 refills | Status: DC

## 2018-12-13 MED ORDER — ASPIRIN 325 MG PO TBEC: 325.0000 mg | DELAYED_RELEASE_TABLET | Freq: Every day | ORAL | 0 refills | Status: DC

## 2018-12-13 MED ORDER — TRAMADOL HCL 50 MG PO TABS: 50.0000 mg | ORAL_TABLET | ORAL | 0 refills | Status: DC | PRN

## 2018-12-13 MED ORDER — FUROSEMIDE 40 MG PO TABS: 40.0000 mg | ORAL_TABLET | Freq: Every day | ORAL | 0 refills | Status: DC

## 2018-12-13 MED ORDER — POTASSIUM CHLORIDE CRYS ER 20 MEQ PO TBCR
20.0000 meq | EXTENDED_RELEASE_TABLET | Freq: Every day | ORAL | Status: DC
  Administered 2018-12-13: 08:00:00 20 meq via ORAL
  Filled 2018-12-13: qty 1

## 2018-12-13 MED ORDER — POTASSIUM CHLORIDE CRYS ER 20 MEQ PO TBCR: 20.0000 meq | EXTENDED_RELEASE_TABLET | Freq: Every day | ORAL | 0 refills | Status: DC

## 2018-12-13 NOTE — Progress Notes (Signed)
CARDIOLOGY RECOMMENDATIONS:  Cath: 12/07/18   Ost LM lesion is 75% stenosed.  Prox Cx lesion is 90% stenosed.  Prox LAD to Mid LAD lesion is 25% stenosed.  The left ventricular systolic function is normal.  LV end diastolic pressure is normal. LVEDP 10 mm Hg.  The left ventricular ejection fraction is 35-45% by visual estimate.  There is no aortic valve stenosis.   Ulcerated lesions in the ostial left main and proximal circumflex.  Will plan for surgery consult.    Diagnostic  Dominance: Right    IMPRESSIONS    1. The left ventricle has normal systolic function, with an ejection fraction of 55-60%. The cavity size was normal. Left ventricular diastolic Doppler parameters are consistent with impaired relaxation.  2. Mild hypokinesis of the left ventricular, entire inferolateral wall.  3. Mild hypokinesis of the left ventricular, apical lateral wall and inferior wall.  4. The right ventricle has normal systolic function. The cavity was normal. There is no increase in right ventricular wall thickness.  Discharge is anticipated in the next 48 hours. Recommendations for medications and follow up:  Discharge Medications: ASA (on full dose), statin, BB, lasix with K+, Amiodarone 200mg  BID (started on 12/11/18) Continue medications as they are currently listed in the [facility]. Exceptions to the above:  Reduce amiodarone at outpatient follow up  Follow Up: The patient's Primary Cardiologist is Tyler Hew, MD  Follow up in the office in 2 week(s). Tele-health virtual visit scheduled.   Signed,  Reino Bellis, NP  8:48 AM 12/13/2018  [facility]

## 2018-12-13 NOTE — Progress Notes (Addendum)
      [address]       [place] [address]             [phone]        5 Days Post-Op Procedure(s) (LRB): CORONARY ARTERY BYPASS GRAFTING (CABG) x 2, SVG TO  OM1, LIMA TO LAD, USING LEFT INTERNAL MAMMARY ARTERY AND RIGHT GREATER SAPHENOUS VEIN HARVESTED ENDOSCOPICALLY (N/A) TRANSESOPHAGEAL ECHOCARDIOGRAM (TEE) (N/A)  Subjective: Patient without complaints this am. He hopes to go home.  Objective: Vital signs in last 24 hours: Temp:  [98.2 F (36.8 C)-98.6 F (37 C)] 98.2 F (36.8 C) (04/06 0357) Pulse Rate:  [64-77] 64 (04/06 0357) Cardiac Rhythm: Normal sinus rhythm (04/06 0357) Resp:  [16-23] 21 (04/06 0357) BP: (113-139)/(60-78) 114/69 (04/06 0357) SpO2:  [92 %-98 %] 92 % (04/06 0357) Weight:  [99.5 kg] 99.5 kg (04/06 0357)  Pre op weight 99.8 kg Current Weight  12/13/18 99.5 kg      Intake/Output from previous day: 04/05 0701 - 04/06 0700 In: 870 [P.O.:870] Out: -    Physical Exam:  Cardiovascular: RRR Pulmonary: Slightly diminished bibasilar breath sounds Abdomen: Soft, non tender, bowel sounds present. Extremities: Mild bilateral lower extremity edema. Ecchymosis right thigh. Wounds: Clean and dry.  No erythema or signs of infection. RLE wounds are clean and dry.  Lab Results: CBC: Recent Labs    12/11/18 0504 12/12/18 0405  WBC 9.5 7.3  HGB 10.1* 10.3*  HCT 29.6* 30.1*  PLT 130* 173   BMET:  Recent Labs    12/11/18 0504 12/12/18 0405  NA 138 139  K 3.9 3.9  CL 105 107  CO2 26 26  GLUCOSE 108* 96  BUN 13 14  CREATININE 0.96 1.03  CALCIUM 8.4* 8.6*    PT/INR:  Lab Results  Component Value Date   INR 1.3 (H) 12/08/2018   INR 0.9 12/06/2018   ABG:  INR: Will add last result for INR, ABG once components are confirmed Will add last 4 CBG results once components are confirmed  Assessment/Plan:  1. CV - Previous a fib. Continues to maintain SR in the 60's. On Amiodarone 200 mg bid and Lopressor 25 mg bid.   2.  Pulmonary - On room air. Encourage incentive spirometer. 3.  Acute blood loss anemia - H and H yesterday stable at 10.3 and 30.1 4. Mild volume overload-continue daily Lasix and K for a few more days 5. Remove chest tube sutures and discharge  Donielle M ZimmermanPA-C 12/13/2018,7:12 AM [phone]  Plan d/c today  I have seen and examined Tyler [name] and agree with the above assessment  and plan.  Grace Isaac MD Beeper [phone] Office [phone] 12/13/2018 9:03 AM

## 2018-12-13 NOTE — Progress Notes (Signed)
Pt provided discharge instructions and educated. IV removed and intact. Pt denies any complaints. Vitals stable. Pt has all belongings. Pt tx by volunteers via wheelchair to meet wife @ [facility] [address]. Jerald Kief, RN

## 2018-12-13 NOTE — Progress Notes (Addendum)
Due to the safety and concern for the health and well-being of our patients during the National Pandemic Covid-19, the Cardiac rehab department staff are unable to provide face to face cardiac rehab phase 1 interaction. However, these patients who are affected will be contacted upon discharge at a later date.  Patients will be provided education on Nutrition, Individualized plan for Exercise and risk factor modification.  A referral to Outpatient Cardiac Rehab program will be provided. Currently the [facility] Outpatient Cardiac Rehab program is closed to patients for group exercise.  Patients will be contacted for scheduling when permitted.  Cherre Huger, BSN Cardiac and Training and development officer

## 2018-12-13 NOTE — Progress Notes (Signed)
Pt chest tube sutures removed. Steri strips applied. Incisions appear clean and dry, no drainage. Pt tolerated well. Jerald Kief, RN

## 2018-12-14 ENCOUNTER — Telehealth (HOSPITAL_COMMUNITY): Payer: Self-pay

## 2018-12-14 MED FILL — Mannitol IV Soln 20%: INTRAVENOUS | Qty: 500 | Status: AC

## 2018-12-14 MED FILL — Lidocaine HCl(Cardiac) IV PF Soln Pref Syr 100 MG/5ML (2%): INTRAVENOUS | Qty: 5 | Status: AC

## 2018-12-14 MED FILL — Heparin Sodium (Porcine) Inj 1000 Unit/ML: INTRAMUSCULAR | Qty: 10 | Status: AC

## 2018-12-14 MED FILL — Sodium Chloride IV Soln 0.9%: INTRAVENOUS | Qty: 2000 | Status: AC

## 2018-12-14 MED FILL — Sodium Bicarbonate IV Soln 8.4%: INTRAVENOUS | Qty: 50 | Status: AC

## 2018-12-14 MED FILL — Electrolyte-R (PH 7.4) Solution: INTRAVENOUS | Qty: 3000 | Status: AC

## 2018-12-14 NOTE — Op Note (Signed)
NAME: Tyler [name], Tyler [name] MEDICAL RECORD HC:[id number] ACCOUNT [id number] DATE OF BIRTH:February 22, 1951 FACILITY: [facility] LOCATION: [place], [place]  OPERATIVE REPORT  DATE OF PROCEDURE:  12/07/2018  PREOPERATIVE DIAGNOSES:  Critical left main coronary occlusive disease.  POSTOPERATIVE DIAGNOSES:  Critical left main coronary occlusive disease.  SURGICAL PROCEDURE:  Coronary artery bypass grafting x2 with the left internal mammary to the left anterior descending coronary artery and reverse saphenous vein graft to the circumflex coronary artery with right thigh greater saphenous endoscopic vein  harvesting.  SURGEON:  Lanelle Bal, MD  FIRST ASSISTANT:  Lars Pinks, PA  BRIEF HISTORY:  The patient is a 68 year old male with little previous cardiac history who presented with approximately 2 weeks of progressive new onset of anginal symptoms.  It easily came on when walking, but because of the symptoms, he sought medical  attention and underwent cardiac catheterization the day prior to surgery with the patient's critical greater than 80% left main obstruction and greater than 90% blockage in the proximal circumflex coronary artery.  Coronary artery bypass grafting was  recommended to the patient, who agreed and signed informed consent.  The right coronary artery was without disease.  Overall, ventricular function was preserved.  Risks and options were discussed with the patient in detail, and he was willing to proceed.  DESCRIPTION OF PROCEDURE:  With Swan-Ganz and arterial line monitors placed, the patient underwent general endotracheal anesthesia without incident.  The skin, the chest and legs were prepped with Betadine, draped in the usual sterile manner.   Appropriate timeout was performed, and we proceeded with endoscopic vein harvesting of a segment of vein from the right thigh, right greater saphenous.  Median sternotomy was performed.  Left internal mammary  artery was dissected down as a pedicle graft.   The distal artery was divided and had good free flow.  The vessel was hydrostatically dilated with heparinized saline.  The pericardium was then opened.  Overall, ventricular function appeared preserved.  The patient was systemically heparinized.  The  ascending aorta was cannulated.  The right atrium was cannulated.  An aortic root vent cardioplegia needle was introduced into the ascending aorta.  The patient was placed on cardiopulmonary bypass 2.4 L/min per sq m.  The sites of anastomosis were  selected and dissected at the epicardium.  The patient's body temperature was cooled to 32 degrees.  Aortic crossclamp was applied, and 600 mL of cold blood potassium cardioplegia was administered antegrade with rapid diastolic arrest of the heart.   Myocardial septal temperature was monitored throughout the crossclamp.  We first turned our attention to the circumflex coronary artery.  The heart was elevated, and the major supplier of the lateral wall was partially intramyocardial.  The vessel was  opened and admitted a 1.5 mm probe distally.  Using a running 7-0 Prolene, distal anastomosis was performed with a segment of reverse saphenous vein graft.  Additional cold blood cardioplegia was administered down the vein graft.  Attention was then  turned to the left anterior descending coronary artery, which was partially intramyocardial.  The vessel was dissected free and it was identified.  The vessel was opened and admitted a 1.5 mm probe distally.  Using a running 8-0 Prolene, the left  internal mammary artery was anastomosed to the left anterior descending coronary artery.  With crossclamp still in place, a single punch aortotomy was performed, and the vein graft to the obtuse marginal was anastomosed to the ascending aorta.  Bulldog  was removed from  the mammary artery with prompt rise in myocardial septal temperature.  Heart was allowed to passively fill and  deair, and the proximal anastomosis was completed.  Total aortic crossclamp was removed with total crossclamp time of 50  minutes.  The patient spontaneously converted to a sinus rhythm.  Sites of anastomosis were inspected and free of bleeding.  Atrial and ventricular pacing wires were applied with the patient's body temperature rewarmed to 37 degrees.  He was then  ventilated and weaned from cardiopulmonary bypass without difficulty.  He remained hemodynamically stable, was decannulated in the usual fashion.  Protamine sulfate was administered with operative field hemostatic.  A graft marker was applied.  A left  pleural tube and Blake mediastinal drain were left in place.  Pericardium was loosely reapproximated.  Sternum was closed with #6 stainless steel wire.  Fascia was closed with interrupted 0 Vicryl, running 3-0 Vicryl subcutaneous tissue, 4-0 subcuticular  stitch in skin edges.  Dry dressings were applied.  Sponge and needle count was reported as correct at completion of procedure.  The patient tolerated the procedure without obvious complication.  Total pump time was 72 minutes.  He did not require any  blood bank blood products during the operative procedure.  RF tag scanning was completed and reported clear code at the completion of the case.  The patient was then transferred to this cardiac care unit still intubated.  LN/NUANCE  D:12/14/2018 T:12/14/2018 JOB:[id number]

## 2018-12-15 ENCOUNTER — Telehealth (HOSPITAL_COMMUNITY): Payer: Self-pay | Admitting: *Deleted

## 2018-12-19 ENCOUNTER — Encounter: Payer: Self-pay | Admitting: Family Medicine

## 2018-12-29 ENCOUNTER — Telehealth: Payer: Self-pay | Admitting: Adult Health

## 2018-12-29 NOTE — Telephone Encounter (Signed)
Smartphone/ virtual consent/ my chart/ pre reg completed °

## 2018-12-30 ENCOUNTER — Encounter: Payer: Self-pay | Admitting: *Deleted

## 2018-12-30 ENCOUNTER — Telehealth (INDEPENDENT_AMBULATORY_CARE_PROVIDER_SITE_OTHER): Payer: Medicare HMO | Admitting: Adult Health

## 2018-12-30 ENCOUNTER — Telehealth: Payer: Self-pay | Admitting: Physician Assistant

## 2018-12-30 ENCOUNTER — Telehealth: Payer: Self-pay | Admitting: *Deleted

## 2018-12-30 VITALS — Ht 71.0 in | Wt 221.0 lb

## 2018-12-30 DIAGNOSIS — E782 Mixed hyperlipidemia: Secondary | ICD-10-CM

## 2018-12-30 DIAGNOSIS — Z79899 Other long term (current) drug therapy: Secondary | ICD-10-CM | POA: Diagnosis not present

## 2018-12-30 DIAGNOSIS — I2510 Atherosclerotic heart disease of native coronary artery without angina pectoris: Secondary | ICD-10-CM

## 2018-12-30 DIAGNOSIS — E7800 Pure hypercholesterolemia, unspecified: Secondary | ICD-10-CM

## 2018-12-30 MED ORDER — METOPROLOL TARTRATE 25 MG PO TABS: 25.0000 mg | ORAL_TABLET | Freq: Two times a day (BID) | ORAL | 0 refills | Status: DC

## 2018-12-30 MED ORDER — ATORVASTATIN CALCIUM 80 MG PO TABS: 80.0000 mg | ORAL_TABLET | Freq: Every day | ORAL | 0 refills | Status: DC

## 2018-12-30 NOTE — Patient Instructions (Signed)
Medication Instructions:  NO CHANGES- Your physician recommends that you continue on your current medications as directed. Please refer to the Current Medication list given to you today. If you need a refill on your cardiac medications before your next appointment, please call your pharmacy.  Labwork: FAST LIPID,LFT, CBC AND BMET-THE WEEK BEFORE FOLLOW UP APPT HERE IN OUR OFFICE AT [facility]  You will need to fast. DO NOT EAT OR DRINK PAST MIDNIGHT.     You will NOT need to fast   Take the provided lab slips with you to the lab for your blood draw.   When you have your labs (blood work) drawn today and your tests are completely normal, you will receive your results only by MyChart Message (if you have MyChart) -OR-  A paper copy in the mail.  If you have any lab test that is abnormal or we need to change your treatment, we will call you to review these results.  Special Instructions: CALL WITH ANY QUESTIONS OR CONCERNS   Follow-Up: You will need a follow up appointment in 3 months- WITH Glenetta Hew, MD or one of the following Advanced Practice Providers on your designated Care Team:  Rosaria Ferries, PA-C  Jory Sims, DNP, ANP    At [facility], you and your health needs are our priority.  As part of our continuing mission to provide you with exceptional heart care, we have created designated Provider Care Teams.  These Care Teams include your primary Cardiologist (physician) and Advanced Practice Providers (APPs -  Physician Assistants and Nurse Practitioners) who all work together to provide you with the care you need, when you need it.  Thank you for choosing [facility] at [facility]!!

## 2018-12-30 NOTE — Telephone Encounter (Signed)
      [address]       [place] [address]             [phone]     CARDIOTHORACIC SURGERY TELEPHONE VIRTUAL OFFICE NOTE  Primary Cardiologist is Glenetta Hew, MD PCP is McGowen, Adrian Blackwater, MD   HPI:  I spoke with Tyler [name] (DOB Nov 17, 1950 ) via telephone on 12/30/2018 at 11:44 AM and verified that I was speaking with the correct person.  Tyler [name] is s/p NSTEMI and coronary artery bypass grafting surgery x 2 on 12/07/2018 by Dr. Servando Snare. We discussed the reason for conducting our visit virtually instead of in-person.  The patient expressed understanding the circumstances and agreed to proceed as described. Tyler [name] states he has been walking a minimum of 15 minutes and up to 45 minutes daily and sometimes more. He denies shortness of breath, chest pain, fever, or cough. He states his incisions are healing well and there is no drainage. He still has some steri strips on chest tube sites.   Current Outpatient Medications  Medication Sig Dispense Refill  . amiodarone (PACERONE) 200 MG tablet Take 1 tablet (200 mg total) by mouth every 12 (twelve) hours. For one week then take 200 mg daily thereafter 60 tablet 1  . aspirin EC 325 MG EC tablet Take 1 tablet (325 mg total) by mouth daily. 30 tablet 0  . atorvastatin (LIPITOR) 80 MG tablet Take 1 tablet (80 mg total) by mouth daily at 6 PM. 90 tablet 0  . metoprolol tartrate (LOPRESSOR) 25 MG tablet Take 1 tablet (25 mg total) by mouth 2 (two) times daily. 180 tablet 0  . omeprazole (PRILOSEC) 20 MG capsule Take 20 mg by mouth daily.     Diagnostic Tests: None    Impression: Surgically stable and recovering well from coronary artery bypass grafting surgery.   Plan: Patient had a virtual visit with Jory Sims NP from cardiology earlier today. He does not require any refill on his medications at this time. Hopefully, Amiodarone will be stopped at his next office visit. He is not taking any  narcotic for pain, but I instructed him no driving until after next appointment. He was instructed he may be a passenger and he would use a pillow under his seat belt. He was instructed to continue with sternal precautions (I.e. no lifting more than 10 pounds) until instructed otherwise. He has a follow up with Dr. Servando Snare on 01/13/2019 and was instructed if he does not hear from the office by 05/06 to call as appointment may be virtual if COVID-19 restrictions are still in place. He was instructed to call for any further questions or concerns in the interim.      I discussed limitations of evaluation and management via telephone.  The patient was advised to call back for repeat telephone consultation or to seek an in-person evaluation if questions arise or the patient's clinical condition changes in any significant manner.  I spent in excess of 15 minutes of non-face-to-face time during the conduct of this telephone virtual office consultation.  Level 1  (99441)             5-10 minutes Level 2  (99442)            11-20 minutes Level 3  (99443)            21-30 minutes   Lars Pinks PA-C 12/30/2018 11:44 AM

## 2018-12-30 NOTE — Progress Notes (Signed)
Virtual Visit via Video Note   This visit type was conducted due to national recommendations for restrictions regarding the COVID-19 Pandemic (e.g. social distancing) in an effort to limit this patient's exposure and mitigate transmission in our community.  Due to his co-morbid illnesses, this patient is at least at moderate risk for complications without adequate follow up.  This format is felt to be most appropriate for this patient at this time.  All issues noted in this document were discussed and addressed.  A limited physical exam was performed with this format.  Please refer to the patient's chart for his consent to telehealth for [facility].   Evaluation Performed:  Follow-up visit  Date:  12/30/2018   ID:  Tyler [name], DOB Oct 28, 1950, MRN [id number]  Patient Location: Home Provider Location: Home  PCP:  Tammi Sou, MD  Cardiologist:  Glenetta Hew, MD  Electrophysiologist:  None   Chief Complaint:  [name]  History of Present Illness:    Tyler [name] is a 68 y.o. male we are following post hospitalization with new history of coronary artery disease after admission for intermittent chest pain on 12/06/2018.  The patient was having chest pain with exertion to some extent but also having some occurring at rest.  While taking a walk he has significant chest discomfort and presented to [facility] long emergency room for further evaluation.  His initial EKG revealed mild inferior lateral ST depression with elevated troponin at 0.19.  He was transferred to [facility] for cardiac catheterization.  He was seen by Dr. Glenetta Hew on consultation and diagnosed with non-ST elevation MI.  Risks and benefits of cardiac catheterization were explained and patient was willing to proceed.  The patient was also placed on high-dose atorvastatin prior to catheterization.  Cardiac catheterization by Dr. Irish Lack revealed ulcerated lesions in the ostial left  main and proximal circumflex, with ostial left main lesion of 75%, a proximal circumflex lesion 90% stenosis, proximal to mid LAD lesion 25% stenosis, LVEF of 35% to 45% by visual estimate.  The patient was referred to Dr. Servando Snare at the request of Dr. Ellyn Hack to be evaluated for coronary artery bypass grafting.  Patient did have CABG completed on 12/08/2018 by Dr. Gatha Mayer to LAD and reverse saphenous vein graft to the circumflex coronary artery with right thigh greater saphenous endoscopic vein harvesting.  The patient was discharged on 12/10/2018.  His hospital course was noted to include acute blood loss anemia.  He did not require transfusion.  The patient developed atrial fibrillation postoperatively and started with IV amiodarone and converted to normal sinus rhythm.  He was transitioned to oral amiodarone..  Other history noted hyperlipidemia, GERD, BPH, left knee osteoarthritis with chronic right knee pain.  He he is doing well today.  He is not exhibiting any symptoms of recurrent chest pain, dyspnea, edema, or significant fatigue.  He is now taking amiodarone 200 mg daily and is not experienced any irregular or rapid heart rhythm.  He is out walking daily.  He began slowly and has moved up in his time to approximately 30 minutes.  He is medically compliant.  The patient does not have symptoms concerning for COVID-19 infection (fever, chills, cough, or new shortness of breath).  The patient did ask if he was tested for the virus during hospitalization.  I do not find any records within the chart which documents that he was tested.  I have asked him to follow-up with his  PCP if he wishes to be tested.  He is currently without symptoms.   Past Medical History:  Diagnosis Date  . BPH (benign prostatic hypertrophy)    Flomax helpful 2018/19  . CAD (coronary artery disease) 12/2018   CABG 12/14/2018.  LV function normal  . Chronic pain of right knee    Left knee as well (osteoarthritis).  Dr.  Durward Fortes  . Colon polyps 2013   NON-adenomatous 2013---recall 10 yrs.  . Elevated PSA 07/10/2016   Dr. Gaynelle Arabian saw him 07/15/16, did f/u PSA and it was 2.78: f/u was recommended but pt did not do this as of 07/2017.  Repeat PSA here 07/2017 was 3.1, with 19% free (free a little low).  Pt then followed up with Dr. Gaynelle Arabian and tx for BPH w/plan of repeat PSA 1 yr.  PSA 07/2018 down to 3.21.  [name] Erectile dysfunction    Urol rx'd sildenafil 20mg  tabs 11/2017.  [name] Fatigue    + excessive daytime somnolence  . GERD (gastroesophageal reflux disease)   . Hallux limitus 04/2016   1st MPJ joint R foot.  Diclofenac helpful.  Injected by podiatrist 11/2016.  [name] Hyperlipidemia   . Hypogonadism male   . IFG (impaired fasting glucose)    A1c 5.8% 2014  . NSTEMI (non-ST elevated myocardial infarction) ([place]) 12/2018   CABG   Past Surgical History:  Procedure Laterality Date  . COLONOSCOPY  1998 & 2013    Dr [name]; 2 benign polyps 2013--recall 2023.  [name] CORONARY ARTERY BYPASS GRAFT N/A 12/08/2018   Procedure: CORONARY ARTERY BYPASS GRAFTING (CABG) x 2, SVG TO  OM1, LIMA TO LAD, USING LEFT INTERNAL MAMMARY ARTERY AND RIGHT GREATER SAPHENOUS VEIN HARVESTED ENDOSCOPICALLY;  Surgeon: Grace Isaac, MD;  Location: [facility];  Service: Open Heart Surgery;  Laterality: N/A;  . hydrocoelectomy    . LEFT HEART CATH AND CORONARY ANGIOGRAPHY N/A 12/07/2018   Procedure: LEFT HEART CATH AND CORONARY ANGIOGRAPHY;  Surgeon: Jettie Booze, MD;  Location: [facility] CV [facility];  Service: Cardiovascular;  Laterality: N/A;  . nocturnal polysomn  1990s   Sleep lab: no OSA (??)--Dr. [name] said he needs another sleep study as of 2013  . ORBITAL FRACTURE SURGERY    . TEE WITHOUT CARDIOVERSION N/A 12/08/2018   Procedure: TRANSESOPHAGEAL ECHOCARDIOGRAM (TEE);  Surgeon: Grace Isaac, MD;  Location: [facility];  Service: Open Heart Surgery;  Laterality: N/A;  . TONSILLECTOMY AND ADENOIDECTOMY    . UPPER GI ENDOSCOPY  2000 &  2011   dilation 2000     No outpatient medications have been marked as taking for the 12/30/18 encounter (Appointment) with Lendon Colonel, NP.     Allergies:   Sulfonamide derivatives   Social History   Tobacco Use  . Smoking status: Never Smoker  . Smokeless tobacco: Never Used  Substance Use Topics  . Alcohol use: Yes    Alcohol/week: 1.0 standard drinks    Types: 1 Cans of beer per week    Comment:  2 beers/ month  . Drug use: No     Family Hx: The patient's family history includes COPD in his mother; Cancer in his mother; Heart attack in his maternal grandfather and paternal grandfather; Heart attack (age of onset: 70) in his father. There is no history of Colon cancer, Stomach cancer, Diabetes, or Stroke.  ROS:   Please see the history of present illness.    Atorvastatin metoprolol all other systems reviewed and are negative.   Prior  CV studies:   The following studies were reviewed today: LEFT HEART CATH AND CORONARY ANGIOGRAPHY by Dr. Irish Lack on 12/07/2018:  Conclusion     Ost LM lesion is 75% stenosed.  Prox Cx lesion is 90% stenosed.  Prox LAD to Mid LAD lesion is 25% stenosed.  The left ventricular systolic function is normal.  LV end diastolic pressure is normal. LVEDP 10 mm Hg.  The left ventricular ejection fraction is 35-45% by visual estimate.  There is no aortic valve stenosis.  Ulcerated lesions in the ostial left main and proximal circumflex. Will plan for surgery consult.   Anterior hypokinesis from stunned myocardium. I suspect LV function would return with revascularization.   D/w Dr. Ellyn Hack. Will move to 2 Heart.  Restart heparin.    CABG x 2 (LIMA to LAD and SVG to OM) using left internal mammary artery and right greater saphenous vein harvested endoscopically by Dr. Servando Snare on 12/08/2018.    Labs/Other Tests and Data Reviewed:    EKG:  No ECG reviewed.  Recent Labs: 12/07/2018: ALT 30 12/09/2018: Magnesium  2.2 12/12/2018: BUN 14; Creatinine, Ser 1.03; Hemoglobin 10.3; Platelets 173; Potassium 3.9; Sodium 139   Recent Lipid Panel Lab Results  Component Value Date/Time   CHOL 196 12/07/2018 03:19 AM   TRIG 182 (H) 12/07/2018 03:19 AM   HDL 45 12/07/2018 03:19 AM   CHOLHDL 4.4 12/07/2018 03:19 AM   LDLCALC 115 (H) 12/07/2018 03:19 AM   LDLDIRECT 116.0 07/14/2018 09:05 AM    Wt Readings from Last 3 Encounters:  12/13/18 219 lb 5.7 oz (99.5 kg)  07/14/18 228 lb 2 oz (103.5 kg)  07/08/17 224 lb 12 oz (101.9 kg)     Objective:    Vital Signs:  There were no vitals taken for this visit.  General: Awake alert oriented no acute distress Respiratory: No dyspnea when speaking, standing, or walking in his kitchen. Musculoskeletal: Sternotomy incision is healing well without evidence of evisceration or infection.  Chest tube sites still have Steri-Strips, but no evidence of bleeding or infection.  Right leg SVG harvest site well-healed, some scabbing noted without evidence of bleeding or infection. Psych: Normal affect with appropriate responses.  ASSESSMENT & PLAN:    1.  Coronary artery disease: Status post cardiac catheterization with two-vessel disease requiring two-vessel coronary artery bypass grafting.  The patient is recovering well, is medically compliant, beginning a low-level walking program.  He is medically compliant.  He offers no new symptoms.  He will continue on metoprolol and atorvastatin as directed.  He continues on high-dose aspirin for now which will be reduced in 3 months to 81 mg daily.  He is no longer taking Lasix.  2.  Hypercholesterolemia: LDL level 115 during hospitalization.  He is now on atorvastatin 80 mg daily.  Follow-up lipids and LFTs in 3 months prior to follow-up appointment  3.  Postoperative atrial fibrillation: He remains on amiodarone with dose reduced to 200 mg daily.  Will likely have this discontinued on follow-up appointment with Dr. Servando Snare if not  should be discontinued in 6 weeks.  COVID-19 Education: The signs and symptoms of COVID-19 were discussed with the patient and how to seek care for testing (follow up with PCP or arrange E-visit).  The importance of social distancing was discussed today.  Time:   Today, I have spent 39minutes with the patient with telehealth technology discussing the above problems.     Medication Adjustments/Labs and Tests Ordered: Current medicines are reviewed at length  with the patient today.  Concerns regarding medicines are outlined above.   Tests Ordered: BMET, CBC, fasting lipids and LFTs in 3 months.  Medication Changes: None.  Refills provided on metoprolol and atorvastatin.  Disposition:  Follow up 3 months with Dr. Ellyn Hack unless symptomatic  Signed, Phill Myron. West Pugh, ANP, [facility]  12/30/2018 7:37 AM      [facility]

## 2018-12-30 NOTE — Telephone Encounter (Signed)
Virtual Visit Pre-Appointment Phone Call  "(Name), I am calling you today to discuss your upcoming appointment. We are currently trying to limit exposure to the virus that causes COVID-19 by seeing patients at home rather than in the office."  1. "What is the BEST phone number to call the day of the visit?" - include this in appointment notes  2. Do you have or have access to (through a family member/friend) a smartphone with video capability that we can use for your visit?" a. If yes - list this number in appt notes as cell (if different from BEST phone #) and list the appointment type as a VIDEO visit in appointment notes b. If no - list the appointment type as a PHONE visit in appointment notes  3. Confirm consent - "In the setting of the current Covid19 crisis, you are scheduled for a (phone or video) visit with your provider on (date) at (time).  Just as we do with many in-office visits, in order for you to participate in this visit, we must obtain consent.  If you'd like, I can send this to your mychart (if signed up) or email for you to review.  Otherwise, I can obtain your verbal consent now.  All virtual visits are billed to your insurance company just like a normal visit would be.  By agreeing to a virtual visit, we'd like you to understand that the technology does not allow for your provider to perform an examination, and thus may limit your provider's ability to fully assess your condition. If your provider identifies any concerns that need to be evaluated in person, we will make arrangements to do so.  Finally, though the technology is pretty good, we cannot assure that it will always work on either your or our end, and in the setting of a video visit, we may have to convert it to a phone-only visit.  In either situation, we cannot ensure that we have a secure connection.  Are you willing to proceed?" STAFF: Did the patient verbally acknowledge consent to telehealth visit? Document  YES/NO here: YES  4. Advise patient to be prepared - "Two hours prior to your appointment, go ahead and check your blood pressure, pulse, oxygen saturation, and your weight (if you have the equipment to check those) and write them all down. When your visit starts, your provider will ask you for this information. If you have an Apple Watch or Kardia device, please plan to have heart rate information ready on the day of your appointment. Please have a pen and paper handy nearby the day of the visit as well."  5. Give patient instructions for MyChart download to smartphone OR [email or web address] as below if video visit (depending on what platform provider is using)  6. Inform patient they will receive a phone call 15 minutes prior to their appointment time (may be from unknown caller ID) so they should be prepared to answer    TELEPHONE CALL NOTE  Tyler [name] has been deemed a candidate for a follow-up tele-health visit to limit community exposure during the Covid-19 pandemic. I spoke with the patient via phone to ensure availability of phone/video source, confirm preferred email & phone number, and discuss instructions and expectations.  I reminded Tyler [name] to be prepared with any vital sign and/or heart rhythm information that could potentially be obtained via home monitoring, at the time of his visit. I reminded Tyler [name] to expect a phone call prior to  his visit.  Tyler [name], CMA 12/30/2018 7:56 AM   INSTRUCTIONS FOR DOWNLOADING THE MYCHART APP TO SMARTPHONE  - The patient must first make sure to have activated MyChart and know their login information - If Apple, go to [employer] and type in MyChart in the search bar and download the app. If Android, ask patient to go to [employer] and type in [place] in the search bar and download the app. The app is free but as with any other app downloads, their phone may require them to verify saved payment  information or Apple/Android password.  - The patient will need to then log into the app with their MyChart username and password, and select [facility] as their healthcare provider to link the account. When it is time for your visit, go to the MyChart app, find appointments, and click Begin Video Visit. Be sure to Select Allow for your device to access the Microphone and Camera for your visit. You will then be connected, and your provider will be with you shortly.  **If they have any issues connecting, or need assistance please contact MyChart service desk [phone])**  **If using a computer, in order to ensure the best quality for their visit they will need to use either of the following Internet Browsers: [employer], or Google Chrome**  IF USING DOXIMITY or DOXY.ME - The patient will receive a link just prior to their visit by text.     FULL LENGTH CONSENT FOR TELE-HEALTH VISIT   I hereby voluntarily request, consent and authorize [facility] and its employed or contracted physicians, physician assistants, nurse practitioners or other licensed health care professionals (the Practitioner), to provide me with telemedicine health care services (the Services") as deemed necessary by the treating Practitioner. I acknowledge and consent to receive the Services by the Practitioner via telemedicine. I understand that the telemedicine visit will involve communicating with the Practitioner through live audiovisual communication technology and the disclosure of certain medical information by electronic transmission. I acknowledge that I have been given the opportunity to request an in-person assessment or other available alternative prior to the telemedicine visit and am voluntarily participating in the telemedicine visit.  I understand that I have the right to withhold or withdraw my consent to the use of telemedicine in the course of my care at any time, without affecting my right  to future care or treatment, and that the Practitioner or I may terminate the telemedicine visit at any time. I understand that I have the right to inspect all information obtained and/or recorded in the course of the telemedicine visit and may receive copies of available information for a reasonable fee.  I understand that some of the potential risks of receiving the Services via telemedicine include:   Delay or interruption in medical evaluation due to technological equipment failure or disruption;  Information transmitted may not be sufficient (e.g. poor resolution of images) to allow for appropriate medical decision making by the Practitioner; and/or   In rare instances, security protocols could fail, causing a breach of personal health information.  Furthermore, I acknowledge that it is my responsibility to provide information about my medical history, conditions and care that is complete and accurate to the best of my ability. I acknowledge that Practitioner's advice, recommendations, and/or decision may be based on factors not within their control, such as incomplete or inaccurate data provided by me or distortions of diagnostic images or specimens that may result from electronic transmissions.  I understand that the practice of medicine is not an exact science and that Practitioner makes no warranties or guarantees regarding treatment outcomes. I acknowledge that I will receive a copy of this consent concurrently upon execution via email to the email address I last provided but may also request a printed copy by calling the office of [facility].    I understand that my insurance will be billed for this visit.   I have read or had this consent read to me.  I understand the contents of this consent, which adequately explains the benefits and risks of the Services being provided via telemedicine.   I have been provided ample opportunity to ask questions regarding this consent and the Services  and have had my questions answered to my satisfaction.  I give my informed consent for the services to be provided through the use of telemedicine in my medical care  By participating in this telemedicine visit I agree to the above.        Cardiac Questionnaire:    Since your last visit or hospitalization:    1. Have you been having new or worsening chest pain? NO   2. Have you been having new or worsening shortness of breath? NO 3. Have you been having new or worsening leg swelling, wt gain, or increase in abdominal girth (pants fitting more tightly)? NO   4. Have you had any passing out spells? NO    *A YES to any of these questions would result in the appointment being kept. *If all the answers to these questions are NO, we should indicate that given the current situation regarding the worldwide coronarvirus pandemic, at the recommendation of the CDC, we are looking to limit gatherings in our waiting area, and thus will reschedule their appointment beyond four weeks from today.   _____________   COVID-19 Pre-Screening Questions:   Do you currently have a fever?NO  Have you recently travelled on a cruise, internationally, or to [place], [place], [place], [place], [place], or [place], [place] [employer])? NO  Have you been in contact with someone that is currently pending confirmation of Covid19 testing or has been confirmed to have the [place] virus? NO  Are you currently experiencing fatigue? NO   Spoke with patient and he gave consent to do the virtual video visit this morning with Jory Sims. We reveiwed the patient's ,medications, allergies, history, and pharmacy.

## 2019-01-03 ENCOUNTER — Encounter: Payer: Self-pay | Admitting: Family Medicine

## 2019-01-12 ENCOUNTER — Other Ambulatory Visit: Payer: Self-pay | Admitting: Cardiothoracic Surgery

## 2019-01-12 DIAGNOSIS — Z951 Presence of aortocoronary bypass graft: Secondary | ICD-10-CM

## 2019-01-13 ENCOUNTER — Other Ambulatory Visit: Payer: Self-pay

## 2019-01-13 ENCOUNTER — Ambulatory Visit
Admission: RE | Admit: 2019-01-13 | Discharge: 2019-01-13 | Disposition: A | Discharge status: Home / Self Care | Payer: Medicare HMO | Source: Ambulatory Visit | Attending: Cardiothoracic Surgery | Admitting: Cardiothoracic Surgery

## 2019-01-13 ENCOUNTER — Encounter: Payer: Self-pay | Admitting: Cardiothoracic Surgery

## 2019-01-13 ENCOUNTER — Ambulatory Visit (INDEPENDENT_AMBULATORY_CARE_PROVIDER_SITE_OTHER): Payer: Self-pay | Admitting: Cardiothoracic Surgery

## 2019-01-13 VITALS — BP 120/76 | HR 56 | Temp 97.9°F | Resp 16 | Ht 71.0 in | Wt 221.0 lb

## 2019-01-13 DIAGNOSIS — I2510 Atherosclerotic heart disease of native coronary artery without angina pectoris: Secondary | ICD-10-CM

## 2019-01-13 DIAGNOSIS — J9811 Atelectasis: Secondary | ICD-10-CM | POA: Diagnosis not present

## 2019-01-13 DIAGNOSIS — Z951 Presence of aortocoronary bypass graft: Secondary | ICD-10-CM

## 2019-01-13 NOTE — Patient Instructions (Signed)
    [address]       [place] [address]             [phone]       Coronary Artery Bypass Grafting  Care After  Refer to this sheet in the next few weeks. These instructions provide you with information on caring for yourself after your procedure. Your caregiver may also give you more specific instructions. Your treatment has been planned according to current medical practices, but problems sometimes occur. Call your caregiver if you have any problems or questions after your procedure.  Recovery from open heart surgery will be different for everyone. Some people feel well after 3 or 4 weeks, while for others it takes longer. After heart surgery, it may be normal to:  Not have an appetite, feel nauseated by the smell of food, or only want to eat a small amount.   Be constipated because of changes in your diet, activity, and medicines. Eat foods high in fiber. Add fresh fruits and vegetables to your diet. Stool softeners may be helpful.   Feel sad or unhappy. You may be frustrated or cranky. You may have good days and bad days. Do not give up. Talk to your caregiver if you do not feel better.   Feel weakness and fatigue. You many need physical therapy or cardiac rehabilitation to get your strength back.   Develop an irregular heartbeat called atrial fibrillation. Symptoms of atrial fibrillation are a fast, irregular heartbeat or feelings of fluttery heartbeats, shortness of breath, low blood pressure, and dizziness. If these symptoms develop, see your caregiver right away.  MEDICATION  Have a list of all the medicines you will be taking when you leave the hospital. For every medicine, know the following:   Name.   Exact dose.   Time of day to be taken.   How often it should be taken.   Why you are taking it.   Ask which medicines should or should not be taken together. If you take more than one heart medicine, ask if it is okay to take them together. Some  heart medicines should not be taken at the same time because they may lower your blood pressure too much.   Narcotic pain medicine can cause constipation. Eat fresh fruits and vegetables. Add fiber to your diet. Stool softener medicine may help relieve constipation.   Keep a copy of your medicines with you at all times.   Do not add or stop taking any medicine until you check with your caregiver.   Medicines can have side effects. Call your caregiver who prescribed the medicine if you:   Start throwing up, have diarrhea, or have stomach pain.   Feel dizzy or lightheaded when you stand up.   Feel your heart is skipping beats or is beating too fast or too slow.   Develop a rash.   Notice unusual bruising or bleeding.  HOME CARE INSTRUCTIONS  After heart surgery, it is important to learn how to take your pulse. Have your caregiver show you how to take your pulse.   Use your incentive spirometer. Ask your caregiver how long after surgery you need to use it.  Care of your chest incision  Tell your caregiver right away if you notice clicking in your chest (sternum).   Support your chest with a pillow or your arms when you take deep breaths and cough.   Follow your caregiver's instructions about when you can bathe or   swim.   Protect your incision from sunlight during the first year to keep the scar from getting dark.   Tell your caregiver if you notice:   Increased tenderness of your incision.   Increased redness or swelling around your incision.   Drainage or pus from your incision.  Care of your leg incision(s)  Avoid crossing your legs.   Avoid sitting for long periods of time. Change positions every half hour.   Elevate your leg(s) when you are sitting.   Check your leg(s) daily for swelling. Check the incisions for redness or drainage.   Diet is very important to heart health.   Eat plenty of fresh fruits and vegetables. Meats should be lean cut. Avoid canned,  processed, and fried foods.   Talk to a dietician. They can teach you how to make healthy food and drink choices.  Weight  Weigh yourself every day. This is important because it helps to know if you are retaining fluid that may make your heart and lungs work harder.   Use the same scale each time.   Weigh yourself every morning at the same time. You should do this after you go to the bathroom, but before you eat breakfast.   Your weight will be more accurate if you do not wear any clothes.   Record your weight.   Tell your caregiver if you have gained 2 pounds or more overnight.  Activity Stop any activity at once if you have chest pain, shortness of breath, irregular heartbeats, or dizziness. Get help right away if you have any of these symptoms.  Bathing.  Avoid soaking in a bath or hot tub until your incisions are healed.   Rest. You need a balance of rest and activity.   Exercise. Exercise per your caregiver's advice. You may need physical therapy or cardiac rehabilitation to help strengthen your muscles and build your endurance.   Climbing stairs. Unless your caregiver tells you not to climb stairs, go up stairs slowly and rest if you tire. Do not pull yourself up by the handrail.   Driving a car. Follow your caregiver's advice on when you may drive. You may ride as a passenger at any time. When traveling for long periods of time in a car, get out of the car and walk around for a few minutes every 2 hours.   Lifting. Avoid lifting, pushing, or pulling anything heavier than 10 pounds for 6 weeks after surgery or as told by your caregiver.   Returning to work. Check with your caregiver. People heal at different rates. Most people will be able to go back to work 6 to 12 weeks after surgery.   Sexual activity. You may resume sexual relations as told by your caregiver.  SEEK MEDICAL CARE IF:  Any of your incisions are red, painful, or have any type of drainage coming from them.     You have an oral temperature above 101.5 F .   You have ankle or leg swelling.   You have pain in your legs.   You have weight gain of 2 or more pounds a day.   You feel dizzy or lightheaded when you stand up.  SEEK IMMEDIATE MEDICAL CARE IF:  You have angina or chest pain that goes to your jaw or arms. Call your local emergency services right away.   You have shortness of breath at rest or with activity.   You have a fast or irregular heartbeat (arrhythmia).   There is   a "clicking" in your sternum when you move.   You have numbness or weakness in your arms or legs.  MAKE SURE YOU:  Understand these instructions.   Will watch your condition.   Will get help right away if you are not doing well or get worse.    No lifting over 25 lbs for 3 months 

## 2019-01-13 NOTE — Progress Notes (Signed)
[address]       [place] [address]             [phone]      Lamel S Alonzo  Medical Record #[id number] Date of Birth: [birth year]  Referring: Leonie Man, MD Primary Care: Tammi Sou, MD Primary Cardiologist: Glenetta Hew, MD   Chief Complaint:   POST OP FOLLOW UP OPERATIVE REPORT DATE OF PROCEDURE:  12/07/2018 PREOPERATIVE DIAGNOSES:  Critical left main coronary occlusive disease. POSTOPERATIVE DIAGNOSES:  Critical left main coronary occlusive disease. SURGICAL PROCEDURE:  Coronary artery bypass grafting x2 with the left internal mammary to the left anterior descending coronary artery and reverse saphenous vein graft to the circumflex coronary artery with right thigh greater saphenous endoscopic vein  harvesting. SURGEON:  Lanelle Bal, MD  History of Present Illness:     Patient returns to the office today and after his urgent coronary artery bypass grafting for critical left main disease done on March 31. Patient denies any of the chest discomfort or shortness of breath with exertion that he had been noticing prior to surgery.  He progressed well following surgery did not require blood transfusion, he did have a brief episode of atrial fibrillation postoperatively and was ultimately discharged home on p.o. amiodarone.  He has been increasing his physical activity appropriately, he is avoiding any heavy lifting.  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  He notes that he is not required to climb ladders or do heavy lifting.   Past Medical History:  Diagnosis Date  . BPH (benign prostatic hypertrophy)    Flomax helpful 2018/19  . CAD (coronary artery disease) 12/2018   CABG 12/14/2018.  LV function normal  . Chronic pain of right knee    Left knee as well (osteoarthritis).  Dr. Durward Fortes  . Colon polyps 2013   NON-adenomatous 2013---recall 10 yrs.  . Elevated PSA 07/10/2016   Dr. Gaynelle Arabian  saw him 07/15/16, did f/u PSA and it was 2.78: f/u was recommended but pt did not do this as of 07/2017.  Repeat PSA here 07/2017 was 3.1, with 19% free (free a little low).  Pt then followed up with Dr. Gaynelle Arabian and tx for BPH w/plan of repeat PSA 1 yr.  PSA 07/2018 down to 3.21.  [name] Erectile dysfunction    Urol rx'd sildenafil 20mg  tabs 11/2017.  [name] Fatigue    + excessive daytime somnolence  . GERD (gastroesophageal reflux disease)   . Hallux limitus 04/2016   1st MPJ joint R foot.  Diclofenac helpful.  Injected by podiatrist 11/2016.  [name] Hyperlipidemia   . Hypogonadism male   . IFG (impaired fasting glucose)    A1c 5.8% 2014  . NSTEMI (non-ST elevated myocardial infarction) ([facility]) 12/2018   CABG  . Postoperative atrial fibrillation ([facility]) 12/2018   Converted on amiodarone; amio likely to be only short term     Social History   Tobacco Use  Smoking Status Never Smoker  Smokeless Tobacco Never Used    Social History   Substance and Sexual Activity  Alcohol Use Yes  . Alcohol/week: 1.0 standard drinks  . Types: 1 Cans of beer per week   Comment:  2 beers/ month     Allergies  Allergen Reactions  . Sulfonamide Derivatives     REACTION: rash Because of a history of documented adverse serious drug reaction;Medi Alert bracelet  is recommended    Current Outpatient Medications  Medication Sig Dispense Refill  . amiodarone (PACERONE) 200 MG tablet Take 1 tablet (200 mg total) by mouth every 12 (twelve) hours. For one week then take 200 mg daily thereafter (Patient taking differently: Take 200 mg by mouth daily. For one week then take 200 mg daily thereafter) 60 tablet 1  . aspirin EC 325 MG EC tablet Take 1 tablet (325 mg total) by mouth daily. 30 tablet 0  . atorvastatin (LIPITOR) 80 MG tablet Take 1 tablet (80 mg total) by mouth daily at 6 PM. 90 tablet 0  . metoprolol tartrate (LOPRESSOR) 25 MG tablet Take 1 tablet (25 mg total) by mouth 2 (two) times daily. 180 tablet 0  .  omeprazole (PRILOSEC) 20 MG capsule Take 20 mg by mouth daily.     No current facility-administered medications for this visit.        Physical Exam: BP 120/76 (BP Location: Left Arm, Patient Position: Sitting, Cuff Size: Large)   Pulse (!) 56   Temp 97.9 F (36.6 C) (Skin)   Resp 16   Ht 5\' 11"  (1.803 m)   Wt 221 lb (100.2 kg)   SpO2 95% Comment: RA  BMI 30.82 kg/m   General appearance: alert, cooperative and no distress Neurologic: intact Heart: regular rate and rhythm, S1, S2 normal, no murmur, click, rub or gallop Lungs: clear to auscultation bilaterally Abdomen: soft, non-tender; bowel sounds normal; no masses,  no organomegaly Extremities: extremities normal, atraumatic, no cyanosis or edema and Homans sign is negative, no sign of DVT Wound: Sternum is stable and healing well   Diagnostic Studies & Laboratory data:     Recent Radiology Findings:   Dg Chest 2 View  Result Date: 01/13/2019 CLINICAL DATA:  CABG 12/08/2018.  Follow-up. EXAM: CHEST - 2 VIEW COMPARISON:  12/11/2018 FINDINGS: Previous median sternotomy and CABG. Heart size upper limits of normal. Mediastinal shadows are otherwise normal. Improved aeration at both lung bases. Mild persistent atelectasis or scarring right more than left. No visible effusion. No significant bone finding. IMPRESSION: Previous CABG. Improved aeration in both lower lobes. Mild residual atelectasis or scarring, right more than left. Electronically Signed   By: Nelson Chimes M.D.   On: 01/13/2019 11:59    I have independently reviewed the above radiology studies  and reviewed the findings with the patient.   Recent Lab Findings: Lab Results  Component Value Date   WBC 7.3 12/12/2018   HGB 10.3 (L) 12/12/2018   HCT 30.1 (L) 12/12/2018   PLT 173 12/12/2018   GLUCOSE 96 12/12/2018   CHOL 196 12/07/2018   TRIG 182 (H) 12/07/2018   HDL 45 12/07/2018   LDLDIRECT 116.0 07/14/2018   LDLCALC 115 (H) 12/07/2018   ALT 30 12/07/2018    AST 27 12/07/2018   NA 139 12/12/2018   K 3.9 12/12/2018   CL 107 12/12/2018   CREATININE 1.03 12/12/2018   BUN 14 12/12/2018   CO2 26 12/12/2018   TSH 2.41 07/09/2016   INR 1.3 (H) 12/08/2018   HGBA1C 5.5 12/07/2018      Assessment / Plan:   Patient making satisfactory progression following recent urgent coronary artery bypass grafting for critical left main disease.  He is slowly increasing his activity appropriately, he continues on 325 of aspirin, metoprolol and atorvastatin.  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  He was cautioned to avoid any lifting over 25 pounds for at least 3 months  Medication Changes: No orders of the defined types were placed in this encounter.     Grace Isaac MD      [address] [place] [address] Office [phone]   Beeper [phone]  01/13/2019 12:19 PM

## 2019-02-02 ENCOUNTER — Other Ambulatory Visit: Payer: Self-pay | Admitting: Physician Assistant

## 2019-02-03 ENCOUNTER — Telehealth: Payer: Self-pay | Admitting: Cardiology

## 2019-02-03 NOTE — Telephone Encounter (Signed)
The plan is usually 4 to 6 weeks post CABG, okay to stop.  He is probably close enough to this timeframe where he can stop if he has not had any signs or symptoms of A. fib.  Glenetta Hew, MD

## 2019-02-03 NOTE — Telephone Encounter (Signed)
Returned call to pt he states that he had an appt with Dr Servando Snare and he told him that he thought that the amiodarone was no longer needed. He states that he will continue to take until told to stop, please advise  PN per Servando Snare visit: He was started on amiodarone for brief episodes of atrial fibrillation postop, was discharged home in sinus rhythm on 200 mg a day of amiodarone-we will leave to cardiology to monitor for atrial fib and discontinuation of his amiodarone

## 2019-02-03 NOTE — Telephone Encounter (Signed)
New message    Pt c/o medication issue:  1. Name of Medication: amiodarone (PACERONE) 200 MG tablet  2. How are you currently taking this medication (dosage and times per day)? 1 time daily  3. Are you having a reaction (difficulty breathing--STAT)?n/a  4. What is your medication issue?patient wants to know if he should be taken off this medication? Please call to discuss.

## 2019-02-04 NOTE — Telephone Encounter (Signed)
Pt informed of providers result & recommendations. Pt verbalized understanding. No further questions . He will CB should any sx start/return.

## 2019-02-06 ENCOUNTER — Other Ambulatory Visit: Payer: Self-pay | Admitting: Physician Assistant

## 2019-02-17 ENCOUNTER — Telehealth (HOSPITAL_COMMUNITY): Payer: Self-pay

## 2019-02-17 NOTE — Telephone Encounter (Signed)
Phone pt regarding Virtual Cardiac Rehab program. Pt is not interested in Cardiac Rehab at all. Pt has a routine he is following and feels good. Pt also declined talking with dietician.   Carma Lair MS, ACSM CEP 1:49 PM 02/17/2019

## 2019-03-31 DIAGNOSIS — Z79899 Other long term (current) drug therapy: Secondary | ICD-10-CM | POA: Diagnosis not present

## 2019-03-31 LAB — BASIC METABOLIC PANEL
BUN/Creatinine Ratio: 15 (ref 10–24)
BUN: 15 mg/dL (ref 8–27)
CO2: 21 mmol/L (ref 20–29)
Calcium: 9.2 mg/dL (ref 8.6–10.2)
Chloride: 105 mmol/L (ref 96–106)
Creatinine, Ser: 1.02 mg/dL (ref 0.76–1.27)
GFR calc Af Amer: 89 mL/min/{1.73_m2} (ref >59)
GFR calc non Af Amer: 77 mL/min/{1.73_m2} (ref >59)
Glucose: 102 mg/dL — ABNORMAL HIGH (ref 65–99)
Potassium: 4.4 mmol/L (ref 3.5–5.2)
Sodium: 140 mmol/L (ref 134–144)

## 2019-03-31 LAB — CBC
Hematocrit: 43.4 % (ref 37.5–51.0)
Hemoglobin: 14.5 g/dL (ref 13.0–17.7)
MCH: 29.5 pg (ref 26.6–33.0)
MCHC: 33.4 g/dL (ref 31.5–35.7)
MCV: 88 fL (ref 79–97)
Platelets: 181 10*3/uL (ref 150–450)
RBC: 4.92 x10E6/uL (ref 4.14–5.80)
RDW: 12.6 % (ref 11.6–15.4)
WBC: 4.5 10*3/uL (ref 3.4–10.8)

## 2019-03-31 LAB — HEPATIC FUNCTION PANEL
ALT: 37 IU/L (ref 0–44)
AST: 26 IU/L (ref 0–40)
Albumin: 4.3 g/dL (ref 3.8–4.8)
Alkaline Phosphatase: 95 IU/L (ref 39–117)
Bilirubin Total: 0.3 mg/dL (ref 0.0–1.2)
Bilirubin, Direct: 0.09 mg/dL (ref 0.00–0.40)
Total Protein: 6.4 g/dL (ref 6.0–8.5)

## 2019-04-01 ENCOUNTER — Telehealth: Payer: Self-pay | Admitting: Cardiology

## 2019-04-01 NOTE — Telephone Encounter (Signed)

## 2019-04-04 ENCOUNTER — Ambulatory Visit (INDEPENDENT_AMBULATORY_CARE_PROVIDER_SITE_OTHER): Payer: Medicare HMO | Admitting: Cardiology

## 2019-04-04 ENCOUNTER — Other Ambulatory Visit: Payer: Self-pay

## 2019-04-04 ENCOUNTER — Encounter: Payer: Self-pay | Admitting: Cardiology

## 2019-04-04 VITALS — BP 120/70 | HR 65 | Temp 97.7°F | Ht 71.0 in | Wt 231.0 lb

## 2019-04-04 DIAGNOSIS — E785 Hyperlipidemia, unspecified: Secondary | ICD-10-CM

## 2019-04-04 DIAGNOSIS — G4733 Obstructive sleep apnea (adult) (pediatric): Secondary | ICD-10-CM | POA: Diagnosis not present

## 2019-04-04 DIAGNOSIS — Z951 Presence of aortocoronary bypass graft: Secondary | ICD-10-CM | POA: Diagnosis not present

## 2019-04-04 DIAGNOSIS — R5382 Chronic fatigue, unspecified: Secondary | ICD-10-CM | POA: Diagnosis not present

## 2019-04-04 DIAGNOSIS — I2510 Atherosclerotic heart disease of native coronary artery without angina pectoris: Secondary | ICD-10-CM

## 2019-04-04 NOTE — Patient Instructions (Signed)
Medication Instructions:  No changes   If you need a refill on your cardiac medications before your next appointment, please call your pharmacy.   Lab work: Lipids-- fasting If you have labs (blood work) drawn today and your tests are completely normal, you will receive your results only by: [name] MyChart Message (if you have MyChart) OR . A paper copy in the mail If you have any lab test that is abnormal or we need to change your treatment, we will call you to review the results.  Testing/Procedures:  Not needed Follow-Up: At [facility], you and your health needs are our priority.  As part of our continuing mission to provide you with exceptional heart care, we have created designated Provider Care Teams.  These Care Teams include your primary Cardiologist (physician) and Advanced Practice Providers (APPs -  Physician Assistants and Nurse Practitioners) who all work together to provide you with the care you need, when you need it. . You will need a follow up appointment in 6  Months JAN 2021.  Please call our office 2 months in advance to schedule this appointment.  You may see Tyler Hew, MD or one of the following Advanced Practice Providers on your designated Care Team:   . Tyler Ferries, PA-C . Tyler Sims, DNP, ANP  Any Other Special Instructions Will Be Listed Below (If Applicable).

## 2019-04-04 NOTE — Progress Notes (Signed)
PCP: Tyler Sou, MD  Clinic Note: Chief Complaint  Patient presents with   Follow-up    3 month  Meds reviewed verbally with pt.   Coronary Artery Disease    Recent CABG with left main disease seen --N STEMI    HPI: Tyler [name] is a 68 y.o. male with a PMH notable for recent NSTEMI - CAD-(Ost LM 75%, pCx 90%, EF 35-45%) --> CABG x 2 (LIMA-LAD, SVG-Cx - per-op ECho EF 50-55%) who presents for ~3 month f/u  Tyler [name] was last seen 4/23 - TeleHealth by Tyler Sims, DNP --> was doing well.  No chest pain or dyspnea.  No edema or fatigue.  Was still taking 200 mg of amiodarone with no recurrent A. fib.  Was walking daily up to 30 minutes a day.  No longer on Lasix.  Plan had been to reduce to 81 mg aspirin after 3 months.  Was also seen by Dr. Servando [name] 01/13/2019 -> no chest pain or dyspnea.  No further A. fib.  Increase physical activity.  Indicate desire to go back to work.  Cautioned to avoid lifting over 25 pounds for at least 3 months.  Reduced to 81 mg aspirin.  Also stopped amiodarone.   Recent Hospitalizations:  None since NSTEMI 12/07/2018  Studies Personally Reviewed - (if available, images/films reviewed: From Epic Chart or Care Everywhere)  Echo December 07, 2018: EF 55-60%.  GR 1 DD.  Mild inferior-inferolateral and apical lateral HK.  Normal RV.  Normal valves  Cath December 07, 2018: Ost LM 75%-ulcerated, pCx 90%, EF 35-45% --> referred for CABG  CABG x 2 December 08, 2018  (Dr. Servando [name]): (LIMA-LAD, SVG-Cx);  Intra-Op TEE: EF 50 to 55%.  Interval History: Tyler [name] presents here today for his first MD cardiology visit since his MI-CABG.  He is doing really well.  Says he occasionally feels a little bit of an uneasiness in the subxiphoid area --described as a "pinch type sensation that will catch every now and then ".  He says this will recur occasionally without any particular trigger.  Usually it is sitting up or bending down the makes it happen.  He is  doing a lot of walking now basically he enjoys splitting up his walks about anywhere from half an hour to up to an hour and a half.  Each walk sometimes a walker as many as 2 to 2-1/2 miles a day.  Doing this he denies any resting exertional chest pain/pressure or dyspnea.  No resting dyspnea.  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  He is working but not a significant amount.  No PND, orthopnea or edema.  -->  Uses CPAP for sleep apnea. No palpitations --nothing to suggest recurrence of A. fib., lightheadedness, dizziness, weakness or syncope/near syncope. No TIA/amaurosis fugax symptoms.  No claudication.  ROS: A comprehensive was performed. Review of Systems  Constitutional: Negative for malaise/fatigue.  HENT: Negative for congestion and nosebleeds.   Respiratory: Negative for cough and shortness of breath.   Cardiovascular:       Per HPI  Gastrointestinal: Negative for blood in stool and melena.  Genitourinary: Negative for hematuria.  Musculoskeletal: Negative for falls, joint pain and myalgias.  Neurological: Negative for dizziness, weakness and headaches.  Endo/Heme/Allergies: Bruises/bleeds easily (Much less with reduced dose of aspirin).  Psychiatric/Behavioral: Negative for depression and memory loss. The patient is not nervous/anxious and does not have insomnia.  All other systems reviewed and are negative.   I have reviewed and (if needed) personally updated the patient's problem list, medications, allergies, past medical and surgical history, social and family history.   Past Medical History:  Diagnosis Date   BPH (benign prostatic hypertrophy)    Flomax helpful 2018/19   Chronic pain of right knee    Left knee as well (osteoarthritis).  Tyler [name]   Colon polyps 2013   NON-adenomatous 2013---recall 10 yrs.   Elevated PSA 07/10/2016   Tyler [name] saw him 07/15/16, did f/u PSA and it was  2.78: f/u was recommended but pt did not do this as of 07/2017.  Repeat PSA here 07/2017 was 3.1, with 19% free (free a little low).  Pt then followed up with Tyler [name] and tx for BPH w/plan of repeat PSA 1 yr.  PSA 07/2018 down to 3.21.   Erectile dysfunction    Urol rx'd sildenafil 20mg  tabs 11/2017.   Fatigue    + excessive daytime somnolence   GERD (gastroesophageal reflux disease)    Hallux limitus 04/2016   1st MPJ joint R foot.  Diclofenac helpful.  Injected by podiatrist 11/2016.   Hyperlipidemia    Hypogonadism male    IFG (impaired fasting glucose)    A1c 5.8% 2014   Left main coronary artery disease 12/07/2018   NSTEMI:  ost LM ulcerated 75%, pCx 90%, EF 35-45% --> CABG x2 (LIMA-LAD, SVG-Cx) 12/14/2018.  LV function normal on Intra-Op TEE   NSTEMI (non-ST elevated myocardial infarction) ([place]) 12/2018   Critical left main and proximal circumflex disease--CABG x2 (Dr. Servando [name].  LIMA-LAD, SVG-LCx)   Postoperative atrial fibrillation ([place]) 12/2018   Converted on amiodarone; amio likely to be only short term    Past Surgical History:  Procedure Laterality Date   COLONOSCOPY  1998 & 2013    Dr Tyler [name]; 2 benign polyps 2013--recall 2023.   CORONARY ARTERY BYPASS GRAFT N/A 12/08/2018   Procedure: CORONARY ARTERY BYPASS GRAFTING (CABG) x 2, SVG TO  OM1, LIMA TO LAD, USING LEFT INTERNAL MAMMARY ARTERY AND RIGHT GREATER SAPHENOUS VEIN HARVESTED ENDOSCOPICALLY;  Surgeon: Tyler Isaac, MD;  Location: [facility];  Service: Open Heart Surgery;  Laterality: N/A;   hydrocoelectomy     LEFT HEART CATH AND CORONARY ANGIOGRAPHY N/A 12/07/2018   Procedure: LEFT HEART CATH AND CORONARY ANGIOGRAPHY;  Surgeon: Tyler Booze, MD;  Location: [facility];; Ost LM 75%-ulcerated, pCx 90%, EF 35-45% --> referred for CABG   nocturnal polysomn  1990s   Sleep lab: no OSA (??)--Tyler [name] said he needs another sleep study as of 2013   [place]     TEE WITHOUT  CARDIOVERSION N/A 12/08/2018   Procedure: TRANSESOPHAGEAL ECHOCARDIOGRAM (TEE);  Surgeon: Tyler Isaac, MD;  Location: [facility];  Service: Open Heart Surgery;  Laterality: N/A;   TONSILLECTOMY AND ADENOIDECTOMY     TRANSTHORACIC ECHOCARDIOGRAM  12/07/2018   Insetting of non-STEMI: EF 55-60%.  GR 1 DD.  Mild inferior-inferolateral and apical lateral HK.  Normal RV.  Normal valves   UPPER GI ENDOSCOPY  2000 & 2011   dilation 2000    Current Meds  Medication Sig   aspirin EC 81 MG tablet Take 81 mg by mouth daily.   atorvastatin (LIPITOR) 80 MG tablet Take 1 tablet (80 mg total) by mouth daily at 6 PM.   metoprolol tartrate (LOPRESSOR) 25 MG tablet Take 1 tablet (25 mg total) by mouth 2 (two) times daily.  omeprazole (PRILOSEC) 20 MG capsule Take 20 mg by mouth daily.    Allergies  Allergen Reactions   Sulfonamide Derivatives     REACTION: rash Because of a history of documented adverse serious drug reaction;Medi Alert bracelet  is recommended    Social History   Tobacco Use   Smoking status: Never Smoker   Smokeless tobacco: Never Used  Substance Use Topics   Alcohol use: Yes    Alcohol/week: 1.0 standard drinks    Types: 1 Cans of beer per week    Comment:  2 beers/ month   Drug use: No   Social History   Social History Narrative   Married, 1 son.   Lives in [place].   Product/process development scientist -self-employed   No tob, no alc, no drugs.       family history includes COPD in his mother; Cancer in his mother; Heart attack in his maternal grandfather and paternal grandfather; Heart attack (age of onset: 63) in his father.  Wt Readings from Last 3 Encounters:  04/04/19 231 lb (104.8 kg)  01/13/19 221 lb (100.2 kg)  12/30/18 221 lb (100.2 kg)  -- @ home ususally ~220 lb (feels like he has lost weight)  PHYSICAL EXAM BP 120/70 (BP Location: Left Arm, Patient Position: Sitting, Cuff Size: Normal)    Pulse 65    Temp 97.7 F (36.5 C)    Ht 5\' 11"   (1.803 m)    Wt 231 lb (104.8 kg)    SpO2 98%    BMI 32.22 kg/m  Physical Exam  Constitutional: He is oriented to person, place, and time. He appears well-developed and well-nourished. No distress.  Mildly obese.  Well-groomed.  Healthy-appearing.  HENT:  Head: Normocephalic and atraumatic.  Neck: Normal range of motion. Neck supple. No hepatojugular reflux and no JVD present. Carotid bruit is not present.  Cardiovascular: Normal rate, regular rhythm, S1 normal, S2 normal and intact distal pulses.  No extrasystoles are present. PMI is not displaced. Exam reveals distant heart sounds. Exam reveals no gallop and no friction rub.  No murmur heard. Pulmonary/Chest: Effort normal and breath sounds normal. No respiratory distress. He has no wheezes. He has no rales.  Abdominal: Soft. Bowel sounds are normal. He exhibits no distension. There is no abdominal tenderness. There is no rebound.  Musculoskeletal: Normal range of motion.        General: No edema.  Neurological: He is alert and oriented to person, place, and time.  Psychiatric: He has a normal mood and affect. His behavior is normal. Judgment and thought content normal.  Vitals reviewed.    Adult ECG Report  Rate: 65 ;  Rhythm: normal sinus rhythm and Normal axis, intervals and durations;   Narrative Interpretation: Normal EKG   Other studies Reviewed: Additional studies/ records that were reviewed today include:  Recent Labs:   Lab Results  Component Value Date   CREATININE 1.02 03/31/2019   BUN 15 03/31/2019   NA 140 03/31/2019   K 4.4 03/31/2019   CL 105 03/31/2019   CO2 21 03/31/2019   Lab Results  Component Value Date   CHOL 196 12/07/2018   HDL 45 12/07/2018   LDLCALC 115 (H) 12/07/2018   LDLDIRECT 116.0 07/14/2018   TRIG 182 (H) 12/07/2018   CHOLHDL 4.4 12/07/2018  --These were checked at the time of his MI.  Was started on atorvastatin at that time.  Lab Results  Component Value Date   WBC 4.5 03/31/2019  HGB 14.5 03/31/2019   HCT 43.4 03/31/2019   MCV 88 03/31/2019   PLT 181 03/31/2019   -- ? Lipids were not checked with July labs.   ASSESSMENT / PLAN: Problem List Items Addressed This Visit    S/P CABG x 2 (Chronic)   Relevant Orders   EKG 12-Lead (Completed)   Obstructive sleep apnea (Chronic)    Uses CPAP.  Doing well.      Relevant Orders   EKG 12-Lead (Completed)   Left Main CAD - s/p CABG x 2 - Primary (Chronic)    Left main and circumflex disease status post CABG.  Documented in the time a non-ST elevation MI.  Aspirin dose recently reduced back to 81 mg.  Is on low-dose beta-blocker with well-controlled pressures and heart rates.  With minimal symptoms, I am reluctant to add any additional therapy such as ARB.  He is on high intensity statin due for labs.      Relevant Medications   aspirin EC 81 MG tablet   Other Relevant Orders   EKG 12-Lead (Completed)   Lipid panel   Hyperlipidemia with target LDL less than 70 (Chronic)    Unfortunately, lipids were ordered, but not drawn at the time of his lab evaluation earlier this month.  We will order lipid panel now.  Continue high-dose atorvastatin, may require additional therapy.      Relevant Medications   aspirin EC 81 MG tablet   Other Relevant Orders   Lipid panel   Chronic fatigue (Chronic)    This seems to be relatively well controlled.  But as result I would be reluctant to push his beta-blocker.  Currently blood pressure looks okay so I would not add ARB, but would certainly not increase beta-blocker dose.         I spent a total of 37minutes with the patient and chart review. >  50% of the time was spent in direct patient consultation.   Current medicines are reviewed at length with the patient today.  (+/- concerns) none  Patient Instructions  Medication Instructions:  No changes   If you need a refill on your cardiac medications before your next appointment, please call your pharmacy.   Lab  work: Lipids-- fasting If you have labs (blood work) drawn today and your tests are completely normal, you will receive your results only by:  [place] (if you have MyChart) OR  A paper copy in the mail If you have any lab test that is abnormal or we need to change your treatment, we will call you to review the results.  Testing/Procedures:  Not needed Follow-Up: At [facility], you and your health needs are our priority.  As part of our continuing mission to provide you with exceptional heart care, we have created designated Provider Care Teams.  These Care Teams include your primary Cardiologist (physician) and Advanced Practice Providers (APPs -  Physician Assistants and Nurse Practitioners) who all work together to provide you with the care you need, when you need it.  You will need a follow up appointment in 6  Months JAN 2021.  Please call our office 2 months in advance to schedule this appointment.  You may see Glenetta Hew, MD or one of the following Advanced Practice Providers on your designated Care Team:    Rosaria Ferries, PA-C  Tyler Sims, DNP, ANP  Any Other Special Instructions Will Be Listed Below (If Applicable).    Studies Ordered:   Orders Placed This  Encounter  Procedures   Lipid panel   EKG 12-Lead      Glenetta Hew, M.D., M.S. Interventional Cardiologist   Pager # [phone] Phone # [phone] [address] [place], [place] [address]   Thank you for choosing Heartcare at [facility]!!

## 2019-04-05 ENCOUNTER — Encounter: Payer: Self-pay | Admitting: Cardiology

## 2019-04-05 NOTE — Assessment & Plan Note (Signed)
This seems to be relatively well controlled.  But as result I would be reluctant to push his beta-blocker.  Currently blood pressure looks okay so I would not add ARB, but would certainly not increase beta-blocker dose.

## 2019-04-05 NOTE — Assessment & Plan Note (Signed)
Left main and circumflex disease status post CABG.  Documented in the time a non-ST elevation MI.  Aspirin dose recently reduced back to 81 mg.  Is on low-dose beta-blocker with well-controlled pressures and heart rates.  With minimal symptoms, I am reluctant to add any additional therapy such as ARB.  He is on high intensity statin due for labs.

## 2019-04-05 NOTE — Assessment & Plan Note (Signed)
Uses CPAP.  Doing well.

## 2019-04-05 NOTE — Assessment & Plan Note (Signed)
Unfortunately, lipids were ordered, but not drawn at the time of his lab evaluation earlier this month.  We will order lipid panel now.  Continue high-dose atorvastatin, may require additional therapy.

## 2019-04-11 DIAGNOSIS — I2510 Atherosclerotic heart disease of native coronary artery without angina pectoris: Secondary | ICD-10-CM | POA: Diagnosis not present

## 2019-04-11 DIAGNOSIS — E785 Hyperlipidemia, unspecified: Secondary | ICD-10-CM | POA: Diagnosis not present

## 2019-04-11 LAB — LIPID PANEL
Chol/HDL Ratio: 3.4 ratio (ref 0.0–5.0)
Cholesterol, Total: 134 mg/dL (ref 100–199)
HDL: 40 mg/dL (ref >39)
LDL Calculated: 72 mg/dL (ref 0–99)
Triglycerides: 110 mg/dL (ref 0–149)
VLDL Cholesterol Cal: 22 mg/dL (ref 5–40)

## 2019-05-06 ENCOUNTER — Encounter: Payer: Self-pay | Admitting: Family Medicine

## 2019-06-03 ENCOUNTER — Ambulatory Visit (INDEPENDENT_AMBULATORY_CARE_PROVIDER_SITE_OTHER): Payer: Medicare HMO

## 2019-06-03 ENCOUNTER — Other Ambulatory Visit: Payer: Self-pay

## 2019-06-03 DIAGNOSIS — Z23 Encounter for immunization: Secondary | ICD-10-CM | POA: Diagnosis not present

## 2019-06-28 ENCOUNTER — Other Ambulatory Visit: Payer: Self-pay | Admitting: Adult Health

## 2019-07-18 ENCOUNTER — Other Ambulatory Visit: Payer: Self-pay

## 2019-07-18 ENCOUNTER — Encounter: Payer: Self-pay | Admitting: Family Medicine

## 2019-07-18 ENCOUNTER — Ambulatory Visit (INDEPENDENT_AMBULATORY_CARE_PROVIDER_SITE_OTHER): Payer: Medicare HMO | Admitting: Family Medicine

## 2019-07-18 VITALS — BP 105/63 | HR 59 | Temp 98.3°F | Resp 16 | Ht 71.0 in | Wt 233.0 lb

## 2019-07-18 DIAGNOSIS — I2510 Atherosclerotic heart disease of native coronary artery without angina pectoris: Secondary | ICD-10-CM | POA: Diagnosis not present

## 2019-07-18 DIAGNOSIS — E291 Testicular hypofunction: Secondary | ICD-10-CM | POA: Diagnosis not present

## 2019-07-18 DIAGNOSIS — R972 Elevated prostate specific antigen [PSA]: Secondary | ICD-10-CM | POA: Diagnosis not present

## 2019-07-18 DIAGNOSIS — Z0000 Encounter for general adult medical examination without abnormal findings: Secondary | ICD-10-CM

## 2019-07-18 DIAGNOSIS — R7301 Impaired fasting glucose: Secondary | ICD-10-CM

## 2019-07-18 DIAGNOSIS — Z23 Encounter for immunization: Secondary | ICD-10-CM

## 2019-07-18 LAB — COMPREHENSIVE METABOLIC PANEL
ALT: 28 U/L (ref 0–53)
AST: 19 U/L (ref 0–37)
Albumin: 4.3 g/dL (ref 3.5–5.2)
Alkaline Phosphatase: 87 U/L (ref 39–117)
BUN: 18 mg/dL (ref 6–23)
CO2: 24 mEq/L (ref 19–32)
Calcium: 9.3 mg/dL (ref 8.4–10.5)
Chloride: 109 mEq/L (ref 96–112)
Creatinine, Ser: 0.95 mg/dL (ref 0.40–1.50)
GFR: 79.28 mL/min (ref >60.00)
Glucose, Bld: 110 mg/dL — ABNORMAL HIGH (ref 70–99)
Potassium: 4.6 mEq/L (ref 3.5–5.1)
Sodium: 140 mEq/L (ref 135–145)
Total Bilirubin: 0.5 mg/dL (ref 0.2–1.2)
Total Protein: 6.5 g/dL (ref 6.0–8.3)

## 2019-07-18 LAB — CBC WITH DIFFERENTIAL/PLATELET
Basophils Absolute: 0 10*3/uL (ref 0.0–0.1)
Basophils Relative: 0.6 % (ref 0.0–3.0)
Eosinophils Absolute: 0.2 10*3/uL (ref 0.0–0.7)
Eosinophils Relative: 3.6 % (ref 0.0–5.0)
HCT: 42.9 % (ref 39.0–52.0)
Hemoglobin: 14.6 g/dL (ref 13.0–17.0)
Lymphocytes Relative: 32.6 % (ref 12.0–46.0)
Lymphs Abs: 2.1 10*3/uL (ref 0.7–4.0)
MCHC: 34 g/dL (ref 30.0–36.0)
MCV: 92.7 fl (ref 78.0–100.0)
Monocytes Absolute: 0.5 10*3/uL (ref 0.1–1.0)
Monocytes Relative: 8.6 % (ref 3.0–12.0)
Neutro Abs: 3.5 10*3/uL (ref 1.4–7.7)
Neutrophils Relative %: 54.6 % (ref 43.0–77.0)
Platelets: 182 10*3/uL (ref 150.0–400.0)
RBC: 4.63 Mil/uL (ref 4.22–5.81)
RDW: 13.6 % (ref 11.5–15.5)
WBC: 6.4 10*3/uL (ref 4.0–10.5)

## 2019-07-18 LAB — LIPID PANEL
Cholesterol: 133 mg/dL (ref 0–200)
HDL: 41.5 mg/dL (ref >39.00)
LDL Cholesterol: 65 mg/dL (ref 0–99)
NonHDL: 91.59
Total CHOL/HDL Ratio: 3
Triglycerides: 135 mg/dL (ref 0.0–149.0)
VLDL: 27 mg/dL (ref 0.0–40.0)

## 2019-07-18 LAB — PSA, MEDICARE: PSA: 2.13 ng/ml (ref 0.10–4.00)

## 2019-07-18 LAB — HEMOGLOBIN A1C: Hgb A1c MFr Bld: 5.8 % (ref 4.6–6.5)

## 2019-07-18 NOTE — Progress Notes (Signed)
Office Note 07/18/2019  CC:  Chief Complaint  Patient presents with  . Annual Exam    pt is fasting    HPI:  Tyler [name] is a 68 y.o. male who is here for annual health maintenance exam. Cardiology and CV surg have given him greenlight to do full activity w/out restriction. No CP, SOB, dizziness, LE swelling, orthopnea, or PND. He is pretty active but no formal exercise.   Says diet fair but with travel more lately for his job his choices have not been as good.   Past Medical History:  Diagnosis Date  . BPH (benign prostatic hypertrophy)    Flomax helpful 2018/19  . Chronic pain of right knee    Left knee as well (osteoarthritis).  Dr. Durward Fortes  . Colon polyps 2013   NON-adenomatous 2013---recall 10 yrs.  . Elevated PSA 07/10/2016   Dr. Gaynelle Arabian saw him 07/15/16, did f/u PSA and it was 2.78: f/u was recommended but pt did not do this as of 07/2017.  Repeat PSA here 07/2017 was 3.1, with 19% free (free a little low).  Pt then followed up with Dr. Gaynelle Arabian and tx for BPH w/plan of repeat PSA 1 yr.  PSA 07/2018 down to 3.21.  [name] Erectile dysfunction    Urol rx'd sildenafil 20mg  tabs 11/2017.  [name] Fatigue    + excessive daytime somnolence  . GERD (gastroesophageal reflux disease)   . Hallux limitus 04/2016   1st MPJ joint R foot.  Diclofenac helpful.  Injected by podiatrist 11/2016.  [name] Hyperlipidemia   . Hypogonadism male   . IFG (impaired fasting glucose)    A1c 5.8% 2014  . 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  LV function normal on Intra-Op TEE  . NSTEMI (non-ST elevated myocardial infarction) ([place]) 12/2018   Critical left main and proximal circumflex disease--CABG x2 (Dr. Servando Snare.  LIMA-LAD, SVG-LCx)  . Postoperative atrial fibrillation ([place]) 12/2018   Converted on amiodarone; amio likely to be only short term    Past Surgical History:  Procedure Laterality  Date  . COLONOSCOPY  1998 & 2013    Dr [name]; 2 benign polyps 2013--recall 2023.  [name] CORONARY ARTERY BYPASS GRAFT N/A 12/08/2018   Procedure: CORONARY ARTERY BYPASS GRAFTING (CABG) x 2, SVG TO  OM1, LIMA TO LAD, USING LEFT INTERNAL MAMMARY ARTERY AND RIGHT GREATER SAPHENOUS VEIN HARVESTED ENDOSCOPICALLY;  Surgeon: Grace Isaac, MD;  Location: [place] [facility];  Service: Open Heart Surgery;  Laterality: N/A;  . hydrocoelectomy    . LEFT HEART CATH AND CORONARY ANGIOGRAPHY N/A 12/07/2018   Procedure: LEFT HEART CATH AND CORONARY ANGIOGRAPHY;  Surgeon: Jettie Booze, MD;  Location: [place] CV LAB;; Ost LM 75%-ulcerated, pCx 90%, EF 35-45% --> referred for CABG  . nocturnal polysomn  1990s   Sleep lab: no OSA (??)--Dr. [name] said he needs another sleep study as of 2013  . ORBITAL FRACTURE SURGERY    . TEE WITHOUT CARDIOVERSION N/A 12/08/2018   Procedure: TRANSESOPHAGEAL ECHOCARDIOGRAM (TEE);  Surgeon: Grace Isaac, MD;  Location: [facility];  Service: Open Heart Surgery;  Laterality: N/A;  . TONSILLECTOMY AND ADENOIDECTOMY    . TRANSTHORACIC ECHOCARDIOGRAM  12/07/2018   In setting of non-STEMI: EF 55-60%.  GR 1 DD.  Mild inferior-inferolateral and apical lateral HK.  Normal RV.  Normal valves  . UPPER GI ENDOSCOPY  2000 & 2011   dilation  2000    Family History  Problem Relation Age of Onset  . Cancer Mother        Gynecologic  . COPD Mother   . Heart attack Father 70        CHF  . Heart attack Maternal Grandfather        > 55  . Heart attack Paternal Grandfather        >55  . Colon cancer Neg Hx   . Stomach cancer Neg Hx   . Diabetes Neg Hx   . Stroke Neg Hx     Social History   Socioeconomic History  . Marital status: Married    Spouse name: Not on file  . Number of children: 1  . Years of education: Not on file  . Highest education level: Not on file  Occupational History  . Not on file  Social Needs  . Financial resource strain: Not on file  . Food insecurity     Worry: Not on file    Inability: Not on file  . Transportation needs    Medical: Not on file    Non-medical: Not on file  Tobacco Use  . Smoking status: Never Smoker  . Smokeless tobacco: Never Used  Substance and Sexual Activity  . Alcohol use: Yes    Alcohol/week: 1.0 standard drinks    Types: 1 Cans of beer per week    Comment:  2 beers/ month  . Drug use: No  . Sexual activity: Not on file  Lifestyle  . Physical activity    Days per week: Not on file    Minutes per session: Not on file  . Stress: Not on file  Relationships  . Social Herbalist on phone: Not on file    Gets together: Not on file    Attends religious service: Not on file    Active member of club or organization: Not on file    Attends meetings of clubs or organizations: Not on file    Relationship status: Not on file  . Intimate partner violence    Fear of current or ex partner: Not on file    Emotionally abused: Not on file    Physically abused: Not on file    Forced sexual activity: Not on file  Other Topics Concern  . Not on file  Social History Narrative   Married, 1 son.   Lives in [place].   Product/process development scientist -self-employed   No tob, no alc, no drugs.       Outpatient Medications Prior to Visit  Medication Sig Dispense Refill  . aspirin EC 81 MG tablet Take 81 mg by mouth daily.    [name] atorvastatin (LIPITOR) 80 MG tablet TAKE 1 TABLET (80 MG TOTAL) BY MOUTH DAILY AT 6 PM. 90 tablet 1  . metoprolol tartrate (LOPRESSOR) 25 MG tablet Take 1 tablet (25 mg total) by mouth 2 (two) times daily. 180 tablet 0  . Multiple Vitamins-Minerals (MULTIVITAMIN MEN PO) Take by mouth daily.    [name] omeprazole (PRILOSEC) 20 MG capsule Take 20 mg by mouth daily.     No facility-administered medications prior to visit.     Allergies  Allergen Reactions  . Sulfonamide Derivatives     REACTION: rash Because of a history of documented adverse serious drug reaction;Medi Alert bracelet  is  recommended    ROS Review of Systems  Constitutional: Negative for appetite change, chills, fatigue and fever.  HENT: Negative for  congestion, dental problem, ear pain and sore throat.   Eyes: Negative for discharge, redness and visual disturbance.  Respiratory: Negative for cough, chest tightness, shortness of breath and wheezing.   Cardiovascular: Negative for chest pain, palpitations and leg swelling.  Gastrointestinal: Negative for abdominal pain, blood in stool, diarrhea, nausea and vomiting.  Genitourinary: Negative for difficulty urinating, dysuria, flank pain, frequency, hematuria and urgency.  Musculoskeletal: Negative for arthralgias, back pain, joint swelling, myalgias and neck stiffness.  Skin: Negative for pallor and rash.  Neurological: Negative for dizziness, speech difficulty, weakness and headaches.  Hematological: Negative for adenopathy. Does not bruise/bleed easily.  Psychiatric/Behavioral: Negative for confusion and sleep disturbance. The patient is not nervous/anxious.     PE; Blood pressure 105/63, pulse (!) 59, temperature 98.3 F (36.8 C), temperature source Temporal, resp. rate 16, height 5\' 11"  (1.803 m), weight 233 lb (105.7 kg), SpO2 96 %. Gen: Alert, well appearing.  Patient is oriented to person, place, time, and situation. AFFECT: pleasant, lucid thought and speech. ENT: Ears: EACs clear, normal epithelium.  TMs with good light reflex and landmarks bilaterally.  Eyes: no injection, icteris, swelling, or exudate.  EOMI, PERRLA. Nose: no drainage or turbinate edema/swelling.  No injection or focal lesion.  Mouth: lips without lesion/swelling.  Oral mucosa pink and moist.  Dentition intact and without obvious caries or gingival swelling.  Oropharynx without erythema, exudate, or swelling.  Neck: supple/nontender.  No LAD, mass, or TM.  Carotid pulses 2+ bilaterally, without bruits. CV: RRR, no m/r/g.   LUNGS: CTA bilat, nonlabored resps, good aeration in all  lung fields. ABD: soft, NT, ND, BS normal.  No hepatospenomegaly or mass.  No bruits. EXT: no clubbing, cyanosis, or edema.  Musculoskeletal: no joint swelling, erythema, warmth, or tenderness.  ROM of all joints intact. Skin - no sores or suspicious lesions or rashes or color changes Rectal exam: negative without mass, lesions or tenderness, PROSTATE EXAM: smooth and symmetric without nodules or tenderness.   Pertinent labs:  Lab Results  Component Value Date   TSH 2.41 07/09/2016   Lab Results  Component Value Date   WBC 4.5 03/31/2019   HGB 14.5 03/31/2019   HCT 43.4 03/31/2019   MCV 88 03/31/2019   PLT 181 03/31/2019   Lab Results  Component Value Date   CREATININE 1.02 03/31/2019   BUN 15 03/31/2019   NA 140 03/31/2019   K 4.4 03/31/2019   CL 105 03/31/2019   CO2 21 03/31/2019   Lab Results  Component Value Date   ALT 37 03/31/2019   AST 26 03/31/2019   ALKPHOS 95 03/31/2019   BILITOT 0.3 03/31/2019   Lab Results  Component Value Date   CHOL 134 04/11/2019   Lab Results  Component Value Date   HDL 40 04/11/2019   Lab Results  Component Value Date   LDLCALC 72 04/11/2019   Lab Results  Component Value Date   TRIG 110 04/11/2019   Lab Results  Component Value Date   CHOLHDL 3.4 04/11/2019   Lab Results  Component Value Date   PSA 3.21 07/14/2018   PSA 4.28 (H) 07/09/2016   PSA 2.95 06/20/2015   Lab Results  Component Value Date   HGBA1C 5.5 12/07/2018   Lab Results  Component Value Date   TESTOSTERONE 352.85 06/20/2015    ASSESSMENT AND PLAN:   Health maintenance exam: Reviewed age and gender appropriate health maintenance issues (prudent diet, regular exercise, health risks of tobacco and excessive alcohol, use of  seatbelts, fire alarms in home, use of sunscreen).  Also reviewed age and gender appropriate health screening as well as vaccine recommendations. Vaccines: Prevnar 13->given today..  All other vaccines UTD. Labs: CBC, CMET,  FLP, PSA, HbA1c (IFG). Prostate ca screening: hx of elevated PSA, followed by urol for a while->I did DRE AND PSA today. Colon ca screening: recall 2023.  An After Visit Summary was printed and given to the patient.  FOLLOW UP:  Return in about 6 months (around 01/15/2020) for routine chronic illness f/u.  Signed:  Crissie Sickles, MD           07/18/2019

## 2019-07-18 NOTE — Addendum Note (Signed)
Addended by: Deveron Furlong D on: 07/18/2019 09:09 AM   Modules accepted: Orders

## 2019-07-18 NOTE — Patient Instructions (Signed)

## 2019-07-19 ENCOUNTER — Other Ambulatory Visit: Payer: Self-pay | Admitting: Adult Health

## 2019-10-06 ENCOUNTER — Other Ambulatory Visit: Payer: Self-pay

## 2019-10-06 ENCOUNTER — Encounter: Payer: Self-pay | Admitting: Cardiology

## 2019-10-06 ENCOUNTER — Ambulatory Visit: Payer: Medicare HMO | Admitting: Cardiology

## 2019-10-06 VITALS — BP 125/76 | HR 64 | Ht 71.0 in | Wt 231.8 lb

## 2019-10-06 DIAGNOSIS — I2510 Atherosclerotic heart disease of native coronary artery without angina pectoris: Secondary | ICD-10-CM | POA: Diagnosis not present

## 2019-10-06 DIAGNOSIS — I214 Non-ST elevation (NSTEMI) myocardial infarction: Secondary | ICD-10-CM

## 2019-10-06 DIAGNOSIS — Z951 Presence of aortocoronary bypass graft: Secondary | ICD-10-CM

## 2019-10-06 DIAGNOSIS — E785 Hyperlipidemia, unspecified: Secondary | ICD-10-CM | POA: Diagnosis not present

## 2019-10-06 MED ORDER — METOPROLOL TARTRATE 25 MG PO TABS: 25.0000 mg | ORAL_TABLET | Freq: Two times a day (BID) | ORAL | 3 refills | Status: DC

## 2019-10-06 NOTE — Progress Notes (Signed)
Primary Care Provider: Tammi Sou, MD Cardiologist: Glenetta Hew, MD Electrophysiologist: None  Clinic Note: Chief Complaint  Patient presents with  . Follow-up    Doing well.  No complaints.  . Coronary Artery Disease    No symptoms since CABG.    HPI:    Tyler [name] is a 69 y.o. male with a PMH notable for history of non-STEMI, found to have left main disease referred for CABG x2, who presents today for 64-month follow-up.   Echo December 07, 2018: EF 55-60%.  GR 1 DD.  Mild inferior-inferolateral and apical lateral HK.  Normal RV.  Normal valves  Cath December 07, 2018: Ost LM 75%-ulcerated, pCx 90%, EF 35-45% --> referred for CABG  CABG x 2 December 08, 2018  (Dr. Servando Snare): (LIMA-LAD, SVG-Cx); ? Intra-Op TEE: EF 50 to 55%.  ALEKS LILJA was last seen back in July 2020 for post CABG follow-up.  Besides some uneasiness in his subxiphoid area, he was doing well without any particular angina.  Was walking up to 2.5 miles a day. --Semiretired Camera operator.  Recent Hospitalizations: None  Reviewed  CV studies:    The following studies were reviewed today: (if available, images/films reviewed: From Epic Chart or Care Everywhere) . None:  Interval History:   Tyler [name] returns here today overall feeling quite well without any major complaints.  He is remaining active walking anywhere from 2 to 20 miles every other day.  He asked about potentially going back to [employer] once it opens up.  He is very much interested in getting back and doing lower body exercises.  He is having some recurrent pinching sensations in his chest so I recommend that he probably avoid upper body exercises.  He has not had any angina or heart failure symptoms since his non-STEMI.  No symptoms of arrhythmias.  Seems be tolerating medications well, but as was the case last visit, he is more symptomatic and stop medications.  CV Review of Symptoms (Summary): no chest pain  or dyspnea on exertion negative for - chest pain, dyspnea on exertion, edema, irregular heartbeat, orthopnea, palpitations, paroxysmal nocturnal dyspnea, rapid heart rate, shortness of breath or Syncope/near syncope, TIA/amaurosis fugax; claudication  The patient does not have symptoms concerning for COVID-19 infection (fever, chills, cough, or new shortness of breath).  The patient is practicing social distancing & Masking.  He informs me that he actually had his first Covid vaccine on January 20 with the second shot pending in 2 weeks.   REVIEWED OF SYSTEMS   A comprehensive ROS was performed. Review of Systems  Constitutional: Negative for malaise/fatigue and weight loss.  HENT: Negative for congestion and nosebleeds.   Respiratory: Negative for cough and wheezing.   Cardiovascular: Negative for leg swelling.  Gastrointestinal: Negative for blood in stool and melena.  Genitourinary: Negative for hematuria.  Musculoskeletal: Positive for back pain (Off-and-on.) and joint pain (Hip and knee OA pain).  Neurological: Negative for dizziness, focal weakness and weakness.  Endo/Heme/Allergies: Does not bruise/bleed easily (Much better).  All other systems reviewed and are negative.  I have reviewed and (if needed) personally updated the patient's problem list, medications, allergies, past medical and surgical history, social and family history.   PAST MEDICAL HISTORY   Past Medical History:  Diagnosis Date  . BPH (benign prostatic hypertrophy)    Flomax helpful 2018/19  . Chronic pain of right knee    Left knee as well (osteoarthritis).  Dr. Durward Fortes  .  Colon polyps 2013   NON-adenomatous 2013---recall 10 yrs.  . Elevated PSA 07/10/2016   Dr. Gaynelle Arabian saw him 07/15/16, did f/u PSA and it was 2.78: f/u was recommended but pt did not do this as of 07/2017.  Repeat PSA here 07/2017 was 3.1, with 19% free (free a little low).  Pt then followed up with Dr. Gaynelle Arabian and tx for BPH  w/plan of repeat PSA 1 yr.  PSA 07/2018 down to 3.21.  [name] Erectile dysfunction    Urol rx'd sildenafil 20mg  tabs 11/2017.  [name] Fatigue    + excessive daytime somnolence  . GERD (gastroesophageal reflux disease)   . Hallux limitus 04/2016   1st MPJ joint R foot.  Diclofenac helpful.  Injected by podiatrist 11/2016.  [name] Hyperlipidemia   . Hypogonadism male   . IFG (impaired fasting glucose)    A1c 5.8% 2014  . 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  LV function normal on Intra-Op TEE  . NSTEMI (non-ST elevated myocardial infarction) ([place]) 12/2018   Critical left main and proximal circumflex disease--CABG x2 (Dr. Servando Snare.  LIMA-LAD, SVG-LCx)  . Postoperative atrial fibrillation ([place]) 12/2018   Converted on amiodarone; amio likely to be only short term    PAST SURGICAL HISTORY   Past Surgical History:  Procedure Laterality Date  . COLONOSCOPY  1998 & 2013    Dr [name]; 2 benign polyps 2013--recall 2023.  [name] CORONARY ARTERY BYPASS GRAFT N/A 12/08/2018   Procedure: CORONARY ARTERY BYPASS GRAFTING (CABG) x 2, SVG TO  OM1, LIMA TO LAD, USING LEFT INTERNAL MAMMARY ARTERY AND RIGHT GREATER SAPHENOUS VEIN HARVESTED ENDOSCOPICALLY;  Surgeon: Grace Isaac, MD;  Location: [facility];  Service: Open Heart Surgery;  Laterality: N/A;  . hydrocoelectomy    . LEFT HEART CATH AND CORONARY ANGIOGRAPHY N/A 12/07/2018   Procedure: LEFT HEART CATH AND CORONARY ANGIOGRAPHY;  Surgeon: Jettie Booze, MD;  Location: [facility];; Ost LM 75%-ulcerated, pCx 90%, EF 35-45% --> referred for CABG  . nocturnal polysomn  1990s   Sleep lab: no OSA (??)--Dr. [name] said he needs another sleep study as of 2013  . ORBITAL FRACTURE SURGERY    . TEE WITHOUT CARDIOVERSION N/A 12/08/2018   Procedure: TRANSESOPHAGEAL ECHOCARDIOGRAM (TEE);  Surgeon: Grace Isaac, MD;  Location: [facility];  Service: Open Heart Surgery;   Laterality: N/A;  . TONSILLECTOMY AND ADENOIDECTOMY    . TRANSTHORACIC ECHOCARDIOGRAM  12/07/2018   In setting of non-STEMI: EF 55-60%.  GR 1 DD.  Mild inferior-inferolateral and apical lateral HK.  Normal RV.  Normal valves  . UPPER GI ENDOSCOPY  2000 & 2011   dilation 2000    MEDICATIONS/ALLERGIES   Current Meds  Medication Sig  . aspirin EC 81 MG tablet Take 81 mg by mouth daily.  [name] atorvastatin (LIPITOR) 80 MG tablet TAKE 1 TABLET (80 MG TOTAL) BY MOUTH DAILY AT 6 PM.  . metoprolol tartrate (LOPRESSOR) 25 MG tablet Take 1 tablet (25 mg total) by mouth 2 (two) times daily.  . Multiple Vitamins-Minerals (MULTIVITAMIN MEN PO) Take by mouth daily.  [name] omeprazole (PRILOSEC) 20 MG capsule Take 20 mg by mouth daily.  . [DISCONTINUED] metoprolol tartrate (LOPRESSOR) 25 MG tablet TAKE 1 TABLET BY MOUTH TWICE A DAY    Allergies  Allergen Reactions  . Sulfonamide Derivatives     REACTION: rash Because of a history of documented  adverse serious drug reaction;Medi Alert bracelet  is recommended    SOCIAL HISTORY/FAMILY HISTORY   Social History   Tobacco Use  . Smoking status: Never Smoker  . Smokeless tobacco: Never Used  Substance Use Topics  . Alcohol use: Yes    Alcohol/week: 1.0 standard drinks    Types: 1 Cans of beer per week    Comment:  2 beers/ month  . Drug use: No   Social History   Social History Narrative   Married, 1 son.   Lives in [place].   Product/process development scientist -self-employed   No tob, no alc, no drugs.       Family History family history includes COPD in his mother; Cancer in his mother; Heart attack in his maternal grandfather and paternal grandfather; Heart attack (age of onset: 69) in his father.  OBJCTIVE -PE, EKG, labs   Wt Readings from Last 3 Encounters:  10/06/19 231 lb 12.8 oz (105.1 kg)  07/18/19 233 lb (105.7 kg)  04/04/19 231 lb (104.8 kg)    Physical Exam: BP 125/76   Pulse 64   Ht 5\' 11"  (1.803 m)   Wt 231 lb 12.8  oz (105.1 kg)   SpO2 96%   BMI 32.33 kg/m  Physical Exam  Constitutional: He is oriented to person, place, and time. He appears well-developed and well-nourished. No distress.  Mildly obese but otherwise well-groomed.  Healthy-appearing.  HENT:  Head: Normocephalic and atraumatic.  Eyes: EOM are normal.  Neck: No JVD present. Carotid bruit is not present.  Cardiovascular: Normal rate, regular rhythm, S1 normal, S2 normal and intact distal pulses.  No extrasystoles are present. Exam reveals distant heart sounds. Exam reveals no gallop and no friction rub.  No murmur heard. Pulmonary/Chest: Effort normal and breath sounds normal. No respiratory distress. He has no wheezes. He has no rales.  Abdominal: Soft. Bowel sounds are normal. He exhibits no distension. There is no abdominal tenderness. There is no rebound.  Musculoskeletal:        General: No edema. Normal range of motion.     Cervical back: Normal range of motion and neck supple.  Neurological: He is alert and oriented to person, place, and time.  Psychiatric: His behavior is normal. Judgment and thought content normal.  Vitals reviewed.   Adult ECG Report  Rate: 64 ;  Rhythm: normal sinus rhythm and Normal axis, intervals durations.;   Narrative Interpretation: Stable/normal EKG  Recent Labs:    Lab Results  Component Value Date   CHOL 133 07/18/2019   HDL 41.50 07/18/2019   LDLCALC 65 07/18/2019   LDLDIRECT 116.0 07/14/2018   TRIG 135.0 07/18/2019   CHOLHDL 3 07/18/2019   Lab Results  Component Value Date   CREATININE 0.95 07/18/2019   BUN 18 07/18/2019   NA 140 07/18/2019   K 4.6 07/18/2019   CL 109 07/18/2019   CO2 24 07/18/2019   Lab Results  Component Value Date   TSH 2.41 07/09/2016    ASSESSMENT/PLAN    Problem List Items Addressed This Visit    S/P CABG x 2 (Chronic)    Monitor for now.  Would consider screening stress test evaluation for him 5 years out from CABG.      Relevant Orders   EKG  12-Lead (Completed)   Left Main CAD - s/p CABG x 2 - Primary (Chronic)    Significant left main and circumflex disease now status post CABG.  No angina since CABG.  Otherwise doing very  well, recovered quite well.  Is on maintenance dose of 81 mg aspirin.  Currently on high-dose statin which could be adjusted based on lipids. Tolerating metoprolol without any major issues.  He has been noted to be on medications to begin with, will hold off on titrating or adding any more medications.      Relevant Medications   metoprolol tartrate (LOPRESSOR) 25 MG tablet   Hyperlipidemia with target LDL less than 70 (Chronic)    Lipids were last checked in November.  Pretty well controlled on current dose of atorvastatin.  Where his next set of lipids to show continued improvement, we can probably back off on atorvastatin to 40 mg.      Relevant Medications   metoprolol tartrate (LOPRESSOR) 25 MG tablet   NSTEMI (non-ST elevated myocardial infarction) ([place]) (Chronic)    Initial presentation was with a non-STEMI.  No further angina or heart failure after CABG.  Echo showed preserved EF with not unexpected inferolateral hypokinesis.  In the absence of symptoms, no further evaluation required.      Relevant Medications   metoprolol tartrate (LOPRESSOR) 25 MG tablet   Other Relevant Orders   EKG 12-Lead (Completed)      COVID-19 Education: The signs and symptoms of COVID-19 were discussed with the patient and how to seek care for testing (follow up with PCP or arrange E-visit).   The importance of social distancing was discussed today.  I spent a total of 28minutes with the patient. >  50% of the time was spent in direct patient consultation.  Additional time spent with chart review  / charting (studies, outside notes, etc): 6 Total Time: 26 min   Current medicines are reviewed at length with the patient today.  (+/- concerns) n/a   Patient Instructions / Medication Changes & Studies & Tests  Ordered   Patient Instructions  Medication Instructions:  NO CHANGE *If you need a refill on your cardiac medications before your next appointment, please call your pharmacy*  Lab Work: NOT NEEDED If you have labs (blood work) drawn today and your tests are completely normal, you will receive your results only by: [name] MyChart Message (if you have MyChart) OR . A paper copy in the mail If you have any lab test that is abnormal or we need to change your treatment, we will call you to review the results.  Testing/Procedures: NOT NEEDED  Follow-Up: At [facility], you and your health needs are our priority.  As part of our continuing mission to provide you with exceptional heart care, we have created designated Provider Care Teams.  These Care Teams include your primary Cardiologist (physician) and Advanced Practice Providers (APPs -  Physician Assistants and Nurse Practitioners) who all work together to provide you with the care you need, when you need it.  Your next appointment:   6 month(s)  The format for your next appointment:   In Person  Provider:   Glenetta Hew, MD  Other Instructions     Studies Ordered:   Orders Placed This Encounter  Procedures  . EKG 12-Lead     Glenetta Hew, M.D., M.S. Interventional Cardiologist   Pager # [phone] Phone # [phone] [address] [place], [place] [address]   Thank you for choosing Heartcare at [facility]!!

## 2019-10-06 NOTE — Patient Instructions (Signed)
Medication Instructions:  NO CHANGE *If you need a refill on your cardiac medications before your next appointment, please call your pharmacy*  Lab Work: NOT NEEDED If you have labs (blood work) drawn today and your tests are completely normal, you will receive your results only by: [name] MyChart Message (if you have MyChart) OR . A paper copy in the mail If you have any lab test that is abnormal or we need to change your treatment, we will call you to review the results.  Testing/Procedures: NOT NEEDED  Follow-Up: At [facility], you and your health needs are our priority.  As part of our continuing mission to provide you with exceptional heart care, we have created designated Provider Care Teams.  These Care Teams include your primary Cardiologist (physician) and Advanced Practice Providers (APPs -  Physician Assistants and Nurse Practitioners) who all work together to provide you with the care you need, when you need it.  Your next appointment:   6 month(s)  The format for your next appointment:   In Person  Provider:   Glenetta Hew, MD  Other Instructions

## 2019-10-08 ENCOUNTER — Encounter: Payer: Self-pay | Admitting: Cardiology

## 2019-10-08 NOTE — Assessment & Plan Note (Signed)
Significant left main and circumflex disease now status post CABG.  No angina since CABG.  Otherwise doing very well, recovered quite well.  Is on maintenance dose of 81 mg aspirin.  Currently on high-dose statin which could be adjusted based on lipids. Tolerating metoprolol without any major issues.  He has been noted to be on medications to begin with, will hold off on titrating or adding any more medications.

## 2019-10-08 NOTE — Assessment & Plan Note (Signed)
Monitor for now.  Would consider screening stress test evaluation for him 5 years out from CABG.

## 2019-10-08 NOTE — Assessment & Plan Note (Signed)
Initial presentation was with a non-STEMI.  No further angina or heart failure after CABG.  Echo showed preserved EF with not unexpected inferolateral hypokinesis.  In the absence of symptoms, no further evaluation required.

## 2019-10-08 NOTE — Assessment & Plan Note (Signed)
Lipids were last checked in November.  Pretty well controlled on current dose of atorvastatin.  Where his next set of lipids to show continued improvement, we can probably back off on atorvastatin to 40 mg.

## 2019-12-14 ENCOUNTER — Other Ambulatory Visit: Payer: Self-pay | Admitting: Adult Health

## 2020-01-13 ENCOUNTER — Encounter: Payer: Self-pay | Admitting: Family Medicine

## 2020-01-13 ENCOUNTER — Ambulatory Visit (INDEPENDENT_AMBULATORY_CARE_PROVIDER_SITE_OTHER): Payer: Medicare HMO | Admitting: Family Medicine

## 2020-01-13 ENCOUNTER — Other Ambulatory Visit: Payer: Self-pay

## 2020-01-13 VITALS — BP 118/80 | HR 82 | Temp 98.4°F | Resp 18 | Ht 71.0 in | Wt 233.6 lb

## 2020-01-13 DIAGNOSIS — I2510 Atherosclerotic heart disease of native coronary artery without angina pectoris: Secondary | ICD-10-CM

## 2020-01-13 DIAGNOSIS — E7800 Pure hypercholesterolemia, unspecified: Secondary | ICD-10-CM

## 2020-01-13 DIAGNOSIS — R7301 Impaired fasting glucose: Secondary | ICD-10-CM | POA: Diagnosis not present

## 2020-01-13 DIAGNOSIS — Z0000 Encounter for general adult medical examination without abnormal findings: Secondary | ICD-10-CM

## 2020-01-13 NOTE — Progress Notes (Signed)
OFFICE VISIT  01/13/2020   CC:  Chief Complaint  Patient presents with  . Hyperlipidemia    Pt states not fasting  . Follow-up   HPI:    Patient is a 69 y.o. Caucasian male who presents for 6 mo f/u CAD, HLD, IFG. He has CAD, is s/p STEMI and CABG 12/2018.   He feels good, is walking a lot and working outside, starting to get back into light nautilus workouts. Diet: going ok with gradual changes in carb/fat intake and portion size. Energy level is fine.  HLD: tolerating statin.  IFG: see diet/exercise comments above.  ROS: no fevers, no CP, no SOB, no wheezing, no cough, no dizziness, no HAs, no rashes, no melena/hematochezia.  No polyuria or polydipsia.  No myalgias.  Intermittent L knee pain w/out swelling.  No focal weakness, paresthesias, or tremors.  No acute vision or hearing abnormalities. No n/v/d or abd pain.  No palpitations.    Past Medical History:  Diagnosis Date  . BPH (benign prostatic hypertrophy)    Flomax helpful 2018/19  . Chronic pain of right knee    Left knee as well (osteoarthritis).  Dr. Durward Fortes  . Colon polyps 2013   NON-adenomatous 2013---recall 10 yrs.  . Elevated PSA 07/10/2016   Dr. Gaynelle Arabian saw him 07/15/16, did f/u PSA and it was 2.78: f/u was recommended but pt did not do this as of 07/2017.  Repeat PSA here 07/2017 was 3.1, with 19% free (free a little low).  Pt then followed up with Dr. Gaynelle Arabian and tx for BPH w/plan of repeat PSA 1 yr.  PSA 07/2018 down to 3.21.  [name] Erectile dysfunction    Urol rx'd sildenafil 20mg  tabs 11/2017.  [name] Fatigue    + excessive daytime somnolence  . GERD (gastroesophageal reflux disease)   . Hallux limitus 04/2016   1st MPJ joint R foot.  Diclofenac helpful.  Injected by podiatrist 11/2016.  [name] Hyperlipidemia   . Hypogonadism male   . IFG (impaired fasting glucose)    A1c 5.8% 2014  . Left main coronary artery disease 12/07/2018   NSTEMI:  ost LM ulcerated 75%, pCx 90%, EF 35-45% --> CABG x2 (LIMA-LAD,  SVG-Cx) 12/14/2018.  LV function normal on Intra-Op TEE  . NSTEMI (non-ST elevated myocardial infarction) ([place]) 12/2018   Critical left main and proximal circumflex disease--CABG x2 (Dr. Servando Snare.  LIMA-LAD, SVG-LCx)  . Postoperative atrial fibrillation ([place]) 12/2018   Converted on amiodarone; amio likely to be only short term    Past Surgical History:  Procedure Laterality Date  . COLONOSCOPY  1998 & 2013    Dr [name]; 2 benign polyps 2013--recall 2023.  [name] CORONARY ARTERY BYPASS GRAFT N/A 12/08/2018   Procedure: CORONARY ARTERY BYPASS GRAFTING (CABG) x 2, SVG TO  OM1, LIMA TO LAD, USING LEFT INTERNAL MAMMARY ARTERY AND RIGHT GREATER SAPHENOUS VEIN HARVESTED ENDOSCOPICALLY;  Surgeon: Grace Isaac, MD;  Location: [facility];  Service: Open Heart Surgery;  Laterality: N/A;  . hydrocoelectomy    . LEFT HEART CATH AND CORONARY ANGIOGRAPHY N/A 12/07/2018   Procedure: LEFT HEART CATH AND CORONARY ANGIOGRAPHY;  Surgeon: Jettie Booze, MD;  Location: [facility];; Ost LM 75%-ulcerated, pCx 90%, EF 35-45% --> referred for CABG  . nocturnal polysomn  1990s   Sleep lab: no OSA (??)--Dr. [name] said he needs another sleep study as of 2013  . ORBITAL FRACTURE SURGERY    . TEE WITHOUT CARDIOVERSION N/A 12/08/2018   Procedure: TRANSESOPHAGEAL ECHOCARDIOGRAM (TEE);  Surgeon: Grace Isaac, MD;  Location: [facility];  Service: Open Heart Surgery;  Laterality: N/A;  . TONSILLECTOMY AND ADENOIDECTOMY    . TRANSTHORACIC ECHOCARDIOGRAM  12/07/2018   In setting of non-STEMI: EF 55-60%.  GR 1 DD.  Mild inferior-inferolateral and apical lateral HK.  Normal RV.  Normal valves  . UPPER GI ENDOSCOPY  2000 & 2011   dilation 2000    Outpatient Medications Prior to Visit  Medication Sig Dispense Refill  . aspirin EC 81 MG tablet Take 81 mg by mouth daily.    [name] atorvastatin (LIPITOR) 80 MG tablet TAKE 1 TABLET (80 MG TOTAL) BY MOUTH DAILY AT 6 PM. 90 tablet 2  . metoprolol tartrate (LOPRESSOR) 25 MG tablet  Take 1 tablet (25 mg total) by mouth 2 (two) times daily. 180 tablet 3  . Multiple Vitamins-Minerals (MULTIVITAMIN MEN PO) Take by mouth daily.    [name] omeprazole (PRILOSEC) 20 MG capsule Take 20 mg by mouth daily.     No facility-administered medications prior to visit.    Allergies  Allergen Reactions  . Sulfonamide Derivatives     REACTION: rash Because of a history of documented adverse serious drug reaction;Medi Alert bracelet  is recommended   PE: Vitals with BMI 01/13/2020 10/06/2019 07/18/2019  Height 5\' 11"  5\' 11"  5\' 11"   Weight 233 lbs 10 oz 231 lbs 13 oz 233 lbs  BMI 32.59 123456 [id number]  Systolic 123456 0000000 123456  Diastolic 80 76 63  Pulse 82 64 59    Gen: Alert, well appearing.  Patient is oriented to person, place, time, and situation. AFFECT: pleasant, lucid thought and speech. CV: RRR, no m/r/g.   LUNGS: CTA bilat, nonlabored resps, good aeration in all lung fields. EXT: no clubbing or cyanosis.  no edema.    LABS:  Lab Results  Component Value Date   TSH 2.41 07/09/2016   Lab Results  Component Value Date   WBC 6.4 07/18/2019   HGB 14.6 07/18/2019   HCT 42.9 07/18/2019   MCV 92.7 07/18/2019   PLT 182.0 07/18/2019   Lab Results  Component Value Date   CREATININE 0.95 07/18/2019   BUN 18 07/18/2019   NA 140 07/18/2019   K 4.6 07/18/2019   CL 109 07/18/2019   CO2 24 07/18/2019   Lab Results  Component Value Date   ALT 28 07/18/2019   AST 19 07/18/2019   ALKPHOS 87 07/18/2019   BILITOT 0.5 07/18/2019   Lab Results  Component Value Date   CHOL 133 07/18/2019   Lab Results  Component Value Date   HDL 41.50 07/18/2019   Lab Results  Component Value Date   LDLCALC 65 07/18/2019   Lab Results  Component Value Date   TRIG 135.0 07/18/2019   Lab Results  Component Value Date   CHOLHDL 3 07/18/2019   Lab Results  Component Value Date   PSA 2.13 07/18/2019   PSA 3.21 07/14/2018   PSA 4.28 (H) 07/09/2016   Lab Results  Component Value Date    HGBA1C 5.8 07/18/2019   Lab Results  Component Value Date   TESTOSTERONE 352.85 06/20/2015   IMPRESSION AND PLAN:  1) CAD, hx of CABG: asymptomatic, has essentially been back in full activity mode and feeling well. He'll continue statin, ASA and TLC, ongoing routine cardiology f/u.  2) HLD: tolerating statin. TLC.  3) IFG: TLC.  4) Preventative health care: vaccines UTD including pfizer covid 19 vaccine. Next colonoscopy due 2023.  He is  not fasting today so he'll return for fasting CMET, FLP, and a1c at his earliest convenience.  An After Visit Summary was printed and given to the patient.  FOLLOW UP: Return in about 6 months (around 07/15/2020) for annual CPE (fasting).  Signed:  Crissie Sickles, MD           01/13/2020

## 2020-01-19 ENCOUNTER — Ambulatory Visit (INDEPENDENT_AMBULATORY_CARE_PROVIDER_SITE_OTHER): Payer: Medicare HMO | Admitting: Family Medicine

## 2020-01-19 ENCOUNTER — Ambulatory Visit: Payer: Medicare HMO

## 2020-01-19 ENCOUNTER — Other Ambulatory Visit: Payer: Self-pay

## 2020-01-19 DIAGNOSIS — R7301 Impaired fasting glucose: Secondary | ICD-10-CM

## 2020-01-19 DIAGNOSIS — I2510 Atherosclerotic heart disease of native coronary artery without angina pectoris: Secondary | ICD-10-CM

## 2020-01-19 DIAGNOSIS — E7800 Pure hypercholesterolemia, unspecified: Secondary | ICD-10-CM

## 2020-01-19 LAB — COMPREHENSIVE METABOLIC PANEL
ALT: 24 U/L (ref 0–53)
AST: 21 U/L (ref 0–37)
Albumin: 4.1 g/dL (ref 3.5–5.2)
Alkaline Phosphatase: 81 U/L (ref 39–117)
BUN: 15 mg/dL (ref 6–23)
CO2: 25 mEq/L (ref 19–32)
Calcium: 8.7 mg/dL (ref 8.4–10.5)
Chloride: 109 mEq/L (ref 96–112)
Creatinine, Ser: 0.89 mg/dL (ref 0.40–1.50)
GFR: 85.35 mL/min (ref >60.00)
Glucose, Bld: 107 mg/dL — ABNORMAL HIGH (ref 70–99)
Potassium: 4.3 mEq/L (ref 3.5–5.1)
Sodium: 138 mEq/L (ref 135–145)
Total Bilirubin: 0.6 mg/dL (ref 0.2–1.2)
Total Protein: 6.2 g/dL (ref 6.0–8.3)

## 2020-01-19 LAB — LIPID PANEL
Cholesterol: 109 mg/dL (ref 0–200)
HDL: 36.2 mg/dL — ABNORMAL LOW (ref >39.00)
LDL Cholesterol: 58 mg/dL (ref 0–99)
NonHDL: 73
Total CHOL/HDL Ratio: 3
Triglycerides: 77 mg/dL (ref 0.0–149.0)
VLDL: 15.4 mg/dL (ref 0.0–40.0)

## 2020-01-19 LAB — HEMOGLOBIN A1C: Hgb A1c MFr Bld: 5.9 % (ref 4.6–6.5)

## 2020-01-31 DIAGNOSIS — E7800 Pure hypercholesterolemia, unspecified: Secondary | ICD-10-CM | POA: Diagnosis not present

## 2020-01-31 DIAGNOSIS — Z0100 Encounter for examination of eyes and vision without abnormal findings: Secondary | ICD-10-CM | POA: Diagnosis not present

## 2020-01-31 DIAGNOSIS — H524 Presbyopia: Secondary | ICD-10-CM | POA: Diagnosis not present

## 2020-04-06 ENCOUNTER — Other Ambulatory Visit: Payer: Self-pay

## 2020-04-06 ENCOUNTER — Ambulatory Visit: Payer: Medicare HMO | Admitting: Cardiology

## 2020-04-06 ENCOUNTER — Encounter: Payer: Self-pay | Admitting: Cardiology

## 2020-04-06 VITALS — BP 118/68 | HR 65 | Ht 71.0 in | Wt 236.6 lb

## 2020-04-06 DIAGNOSIS — I2510 Atherosclerotic heart disease of native coronary artery without angina pectoris: Secondary | ICD-10-CM | POA: Diagnosis not present

## 2020-04-06 DIAGNOSIS — I214 Non-ST elevation (NSTEMI) myocardial infarction: Secondary | ICD-10-CM

## 2020-04-06 DIAGNOSIS — G4733 Obstructive sleep apnea (adult) (pediatric): Secondary | ICD-10-CM | POA: Diagnosis not present

## 2020-04-06 DIAGNOSIS — E785 Hyperlipidemia, unspecified: Secondary | ICD-10-CM | POA: Diagnosis not present

## 2020-04-06 DIAGNOSIS — R5382 Chronic fatigue, unspecified: Secondary | ICD-10-CM

## 2020-04-06 NOTE — Progress Notes (Signed)
Primary Care Provider: Tammi Sou, MD Cardiologist: Glenetta Hew, MD Electrophysiologist: None  Clinic Note: Chief Complaint  Patient presents with  . Follow-up    36-month  . Coronary Artery Disease    No angina; post CABG    HPI:    Tyler [name] is a 69 y.o. male with a PMH notable for history of non-STEMI (LM CAD--CABG x2 in March 2020) who presents today for 15-month follow-up.   EchoMarch 31, 2020:EF 55-60%. GR 1 DD. Mild inferior-inferolateral and apical lateral HK. Normal RV. Normal valves  CathMarch 31, 2020:Ost LM 75%-ulcerated, pCx 90%, EF 35-45%-->referred for CABG  CABG x 2April 1, 2020 (Dr. Gerhardt):(LIMA-LAD, SVG-Cx); ? Intra-Op TEE: EF 50 to 55%.  Tyler [name] was last seen on October 06, 2019.  He was doing very well with no major complaints.  Walking anywhere from 2 to 2.5 miles every other day.  He was hoping to get back into [employer] to do exercises.  Only other very rare pinching sensations in the chest when doing upper body exercises.  No angina or heart failure symptoms.  No arrhythmia symptoms.  Recent Hospitalizations: None  Reviewed  CV studies:    The following studies were reviewed today: (if available, images/films reviewed: From Epic Chart or Care Everywhere) . None:  Interval History:   Tyler [name] returns for 95-month follow-up doing very well.  He has not yet failed to get back into [employer] because he is does not trust the other patrons.  He is therefore still trying to his home exercises and walking.  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  He does a lot of walking when he does these jobs.  He does not to stay busy and preoccupied, but not feeling stressed.  He still has some strange twinging sensations in his chest that feel like something is tugging at when he moves in certain ways.  But otherwise really no anginal symptoms.  CV Review of  Symptoms (Summary) Cardiovascular ROS: no chest pain or dyspnea on exertion positive for - Tugging sensations in his chest, but no angina. negative for - edema, irregular heartbeat, orthopnea, palpitations, paroxysmal nocturnal dyspnea, rapid heart rate, shortness of breath or Lightheadedness or dizziness.  Just some mild orthostatic symptoms.  No TIA or amaurosis fugax symptoms, syncope/near syncope or claudication.  The patient does not have symptoms concerning for COVID-19 infection (fever, chills, cough, or new shortness of breath).  The patient is practicing social distancing & Masking.   First Covid vaccine was on September 28, 2019, had a second shot in February.  REVIEWED OF SYSTEMS   Review of Systems  Constitutional: Negative for malaise/fatigue and weight loss.  HENT: Negative for nosebleeds.   Gastrointestinal: Negative for blood in stool and melena.  Musculoskeletal: Positive for back pain and joint pain. Negative for falls.       Chronic OA -/ bursitis -- knees & back pain.   Endo/Heme/Allergies: Does not bruise/bleed easily.  All other systems reviewed and are negative.   I have reviewed and (if needed) personally updated the patient's problem list, medications, allergies, past medical and surgical history, social and family history.   PAST MEDICAL HISTORY   Past Medical History:  Diagnosis Date  . BPH (benign prostatic hypertrophy)    Flomax helpful 2018/19  . Chronic pain of right knee    Left knee as well (osteoarthritis).  Dr. Durward Fortes  . Colon polyps 2013  NON-adenomatous 2013---recall 10 yrs.  . Elevated PSA 07/10/2016   Dr. Gaynelle Arabian saw him 07/15/16, did f/u PSA and it was 2.78: f/u was recommended but pt did not do this as of 07/2017.  Repeat PSA here 07/2017 was 3.1, with 19% free (free a little low).  Pt then followed up with Dr. Gaynelle Arabian and tx for BPH w/plan of repeat PSA 1 yr.  PSA 07/2018 down to 3.21.  [name] Erectile dysfunction    Urol rx'd  sildenafil 20mg  tabs 11/2017.  [name] Fatigue    + excessive daytime somnolence  . GERD (gastroesophageal reflux disease)   . Hallux limitus 04/2016   1st MPJ joint R foot.  Diclofenac helpful.  Injected by podiatrist 11/2016.  [name] Hyperlipidemia   . Hypogonadism male   . IFG (impaired fasting glucose)    A1c 5.8% 2014  . 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  LV function normal on Intra-Op TEE  . NSTEMI (non-ST elevated myocardial infarction) ([place]) 12/2018   Critical left main and proximal circumflex disease--CABG x2 (Dr. Servando Snare.  LIMA-LAD, SVG-LCx)  . Postoperative atrial fibrillation ([place]) 12/2018   Converted on amiodarone; amio likely to be only short term    PAST SURGICAL HISTORY   Past Surgical History:  Procedure Laterality Date  . COLONOSCOPY  1998 & 2013    Dr [name]; 2 benign polyps 2013--recall 2023.  [name] CORONARY ARTERY BYPASS GRAFT N/A 12/08/2018   Procedure: CORONARY ARTERY BYPASS GRAFTING (CABG) x 2, SVG TO  OM1, LIMA TO LAD, USING LEFT INTERNAL MAMMARY ARTERY AND RIGHT GREATER SAPHENOUS VEIN HARVESTED ENDOSCOPICALLY;  Surgeon: Grace Isaac, MD;  Location: [facility];  Service: Open Heart Surgery;  Laterality: N/A;  . hydrocoelectomy    . LEFT HEART CATH AND CORONARY ANGIOGRAPHY N/A 12/07/2018   Procedure: LEFT HEART CATH AND CORONARY ANGIOGRAPHY;  Surgeon: Jettie Booze, MD;  Location: [facility];; Ost LM 75%-ulcerated, pCx 90%, EF 35-45% --> referred for CABG  . nocturnal polysomn  1990s   Sleep lab: no OSA (??)--Dr. [name] said he needs another sleep study as of 2013  . ORBITAL FRACTURE SURGERY    . TEE WITHOUT CARDIOVERSION N/A 12/08/2018   Procedure: TRANSESOPHAGEAL ECHOCARDIOGRAM (TEE);  Surgeon: Grace Isaac, MD;  Location: [facility];  Service: Open Heart Surgery;  Laterality: N/A;  . TONSILLECTOMY AND ADENOIDECTOMY    . TRANSTHORACIC ECHOCARDIOGRAM   12/07/2018   In setting of non-STEMI: EF 55-60%.  GR 1 DD.  Mild inferior-inferolateral and apical lateral HK.  Normal RV.  Normal valves  . UPPER GI ENDOSCOPY  2000 & 2011   dilation 2000    MEDICATIONS/ALLERGIES   Current Meds  Medication Sig  . aspirin EC 81 MG tablet Take 81 mg by mouth daily.  [name] atorvastatin (LIPITOR) 80 MG tablet TAKE 1 TABLET (80 MG TOTAL) BY MOUTH DAILY AT 6 PM.  . metoprolol tartrate (LOPRESSOR) 25 MG tablet Take 1 tablet (25 mg total) by mouth 2 (two) times daily.  . Multiple Vitamins-Minerals (MULTIVITAMIN MEN PO) Take by mouth daily.  [name] omeprazole (PRILOSEC) 20 MG capsule Take 20 mg by mouth daily.    Allergies  Allergen Reactions  . Sulfonamide Derivatives     REACTION: rash Because of a history of documented adverse serious drug reaction;Medi Alert bracelet  is recommended    SOCIAL HISTORY/FAMILY HISTORY   Reviewed in Epic:  Pertinent  findings: no change  OBJCTIVE -PE, EKG, labs   Wt Readings from Last 3 Encounters:  04/06/20 (!) 236 lb 9.6 oz (107.3 kg)  01/13/20 233 lb 9.6 oz (106 kg)  10/06/19 231 lb 12.8 oz (105.1 kg)    Physical Exam: BP 118/68   Pulse 65   Ht 5\' 11"  (1.803 m)   Wt (!) 236 lb 9.6 oz (107.3 kg)   SpO2 96%   BMI 33.00 kg/m  Physical Exam Vitals reviewed.  Constitutional:      General: He is not in acute distress.    Appearance: Normal appearance. He is obese.  HENT:     Head: Normocephalic and atraumatic.  Neck:     Vascular: No carotid bruit or JVD.  Cardiovascular:     Rate and Rhythm: Normal rate and regular rhythm.     Chest Wall: PMI is not displaced.     Pulses: Normal pulses. No decreased pulses.     Heart sounds: S1 normal and S2 normal. Heart sounds are distant. No murmur heard.  No friction rub. No gallop.   Pulmonary:     Effort: Pulmonary effort is normal. No respiratory distress.     Breath sounds: Normal breath sounds.  Chest:     Chest wall: No tenderness.  Musculoskeletal:         General: No swelling. Normal range of motion.     Cervical back: Normal range of motion and neck supple.  Neurological:     General: No focal deficit present.     Mental Status: He is alert and oriented to person, place, and time.  Psychiatric:        Mood and Affect: Mood normal.        Behavior: Behavior normal.        Thought Content: Thought content normal.        Judgment: Judgment normal.     Adult ECG Report-- n/a  Recent Labs:  - recheck in Dec  Lab Results  Component Value Date   CHOL 109 01/19/2020   HDL 36.20 (L) 01/19/2020   LDLCALC 58 01/19/2020   LDLDIRECT 116.0 07/14/2018   TRIG 77.0 01/19/2020   CHOLHDL 3 01/19/2020   Lab Results  Component Value Date   CREATININE 0.89 01/19/2020   BUN 15 01/19/2020   NA 138 01/19/2020   K 4.3 01/19/2020   CL 109 01/19/2020   CO2 25 01/19/2020   Lab Results  Component Value Date   TSH 2.41 07/09/2016    ASSESSMENT/PLAN    Problem List Items Addressed This Visit    Left Main CAD - s/p CABG x 2 - Primary (Chronic)    Significant left main disease on cath.  Now status post CABG x2.  No further anginal type symptoms.  He did have some mild wall motion abnormalities on echo, but preserved EF.  Plan: Continue aspirin and statin at high dose along with low-dose beta-blocker (max tolerated).  With stable blood pressures, heart rates and symptoms, would not titrate further.  We may build to reduce statin dose based on follow-up lipids.      Obstructive sleep apnea (Chronic)    Uses CPAP - but not every night -encouraged more frequent use.      Hyperlipidemia with target LDL less than 70 (Chronic)    LDL is pretty good as of May.  I would like to see 1 more check to see if he still remains at that level.  If so, would probably then  reduce to 40 mg atorvastatin.  Continue to counsel on dietary adjustment and keeping up with exercise.      Chronic fatigue (Chronic)    Seems to be stable.  Better when he is exercising.   However I would be reluctant to push his beta-blocker any further.      NSTEMI (non-ST elevated myocardial infarction) ([place]) (Chronic)    Non-STEMI on presentation and did have some regional wall motion abnormalities.  Without any active anginal or heart failure symptoms, unable to see a reason to reassess EF or wall motion with echocardiogram.          COVID-19 Education: The signs and symptoms of COVID-19 were discussed with the patient and how to seek care for testing (follow up with PCP or arrange E-visit).   The importance of social distancing and COVID-19 vaccination was discussed today.  I spent a total of 15 minutes with the patient. >  50% of the time was spent in direct patient consultation.  Additional time spent with chart review  / charting (studies, outside notes, etc): 6 Total Time: 21 min   Current medicines are reviewed at length with the patient today.  (+/- concerns) none  Notice: This dictation was prepared with Dragon dictation along with smaller phrase technology. Any transcriptional errors that result from this process are unintentional and may not be corrected upon review.  Patient Instructions / Medication Changes & Studies & Tests Ordered   Patient Instructions  Medication Instructions:  Your physician recommends that you continue on your current medications as directed. Please refer to the Current Medication list given to you today.  *If you need a refill on your cardiac medications before your next appointment, please call your pharmacy*  Lab Work: NONE ordered at this time of appointment   If you have labs (blood work) drawn today and your tests are completely normal, you will receive your results only by: [name] MyChart Message (if you have MyChart) OR . A paper copy in the mail If you have any lab test that is abnormal or we need to change your treatment, we will call you to review the results.  Testing/Procedures: NONE ordered at this time of  appointment   Follow-Up: At [facility], you and your health needs are our priority.  As part of our continuing mission to provide you with exceptional heart care, we have created designated Provider Care Teams.  These Care Teams include your primary Cardiologist (physician) and Advanced Practice Providers (APPs -  Physician Assistants and Nurse Practitioners) who all work together to provide you with the care you need, when you need it.  Your next appointment:   6-7 month(s)  The format for your next appointment:   In Person  Provider:   Glenetta Hew, MD  Other Instructions    Studies Ordered:   No orders of the defined types were placed in this encounter.    Glenetta Hew, M.D., M.S. Interventional Cardiologist   Pager # [phone] Phone # [phone] [address] [place], [place] [address]   Thank you for choosing Heartcare at [facility]!!

## 2020-04-06 NOTE — Assessment & Plan Note (Addendum)
Uses CPAP - but not every night -encouraged more frequent use.

## 2020-04-06 NOTE — Patient Instructions (Addendum)
Medication Instructions:  Your physician recommends that you continue on your current medications as directed. Please refer to the Current Medication list given to you today.  *If you need a refill on your cardiac medications before your next appointment, please call your pharmacy*  Lab Work: NONE ordered at this time of appointment   If you have labs (blood work) drawn today and your tests are completely normal, you will receive your results only by: [name] MyChart Message (if you have MyChart) OR . A paper copy in the mail If you have any lab test that is abnormal or we need to change your treatment, we will call you to review the results.  Testing/Procedures: NONE ordered at this time of appointment   Follow-Up: At [facility], you and your health needs are our priority.  As part of our continuing mission to provide you with exceptional heart care, we have created designated Provider Care Teams.  These Care Teams include your primary Cardiologist (physician) and Advanced Practice Providers (APPs -  Physician Assistants and Nurse Practitioners) who all work together to provide you with the care you need, when you need it.  Your next appointment:   6-7 month(s)  The format for your next appointment:   In Person  Provider:   Glenetta Hew, MD  Other Instructions

## 2020-04-12 ENCOUNTER — Encounter: Payer: Self-pay | Admitting: Cardiology

## 2020-04-12 NOTE — Assessment & Plan Note (Signed)
Significant left main disease on cath.  Now status post CABG x2.  No further anginal type symptoms.  He did have some mild wall motion abnormalities on echo, but preserved EF.  Plan: Continue aspirin and statin at high dose along with low-dose beta-blocker (max tolerated).  With stable blood pressures, heart rates and symptoms, would not titrate further.  We may build to reduce statin dose based on follow-up lipids.

## 2020-04-12 NOTE — Assessment & Plan Note (Signed)
LDL is pretty good as of May.  I would like to see 1 more check to see if he still remains at that level.  If so, would probably then reduce to 40 mg atorvastatin.  Continue to counsel on dietary adjustment and keeping up with exercise.

## 2020-04-12 NOTE — Assessment & Plan Note (Signed)
Non-STEMI on presentation and did have some regional wall motion abnormalities.  Without any active anginal or heart failure symptoms, unable to see a reason to reassess EF or wall motion with echocardiogram.

## 2020-04-12 NOTE — Assessment & Plan Note (Signed)
Seems to be stable.  Better when he is exercising.  However I would be reluctant to push his beta-blocker any further.

## 2020-07-19 ENCOUNTER — Other Ambulatory Visit: Payer: Self-pay

## 2020-07-20 ENCOUNTER — Encounter: Payer: Self-pay | Admitting: Family Medicine

## 2020-07-20 ENCOUNTER — Ambulatory Visit (INDEPENDENT_AMBULATORY_CARE_PROVIDER_SITE_OTHER): Payer: Medicare HMO | Admitting: Family Medicine

## 2020-07-20 VITALS — BP 106/66 | HR 59 | Temp 97.8°F | Resp 16 | Ht 69.25 in | Wt 233.6 lb

## 2020-07-20 DIAGNOSIS — R7301 Impaired fasting glucose: Secondary | ICD-10-CM | POA: Diagnosis not present

## 2020-07-20 DIAGNOSIS — Z125 Encounter for screening for malignant neoplasm of prostate: Secondary | ICD-10-CM | POA: Diagnosis not present

## 2020-07-20 DIAGNOSIS — I2510 Atherosclerotic heart disease of native coronary artery without angina pectoris: Secondary | ICD-10-CM | POA: Diagnosis not present

## 2020-07-20 DIAGNOSIS — Z23 Encounter for immunization: Secondary | ICD-10-CM

## 2020-07-20 DIAGNOSIS — Z87898 Personal history of other specified conditions: Secondary | ICD-10-CM | POA: Diagnosis not present

## 2020-07-20 DIAGNOSIS — Z0000 Encounter for general adult medical examination without abnormal findings: Secondary | ICD-10-CM

## 2020-07-20 LAB — LIPID PANEL
Cholesterol: 116 mg/dL (ref 0–200)
HDL: 38.9 mg/dL — ABNORMAL LOW (ref >39.00)
LDL Cholesterol: 61 mg/dL (ref 0–99)
NonHDL: 77.44
Total CHOL/HDL Ratio: 3
Triglycerides: 83 mg/dL (ref 0.0–149.0)
VLDL: 16.6 mg/dL (ref 0.0–40.0)

## 2020-07-20 LAB — COMPREHENSIVE METABOLIC PANEL
ALT: 30 U/L (ref 0–53)
AST: 22 U/L (ref 0–37)
Albumin: 4.3 g/dL (ref 3.5–5.2)
Alkaline Phosphatase: 75 U/L (ref 39–117)
BUN: 19 mg/dL (ref 6–23)
CO2: 25 mEq/L (ref 19–32)
Calcium: 9.1 mg/dL (ref 8.4–10.5)
Chloride: 106 mEq/L (ref 96–112)
Creatinine, Ser: 0.94 mg/dL (ref 0.40–1.50)
GFR: 84.08 mL/min (ref >60.00)
Glucose, Bld: 106 mg/dL — ABNORMAL HIGH (ref 70–99)
Potassium: 4.3 mEq/L (ref 3.5–5.1)
Sodium: 138 mEq/L (ref 135–145)
Total Bilirubin: 0.5 mg/dL (ref 0.2–1.2)
Total Protein: 6.5 g/dL (ref 6.0–8.3)

## 2020-07-20 LAB — PSA, MEDICARE: PSA: 2.35 ng/ml (ref 0.10–4.00)

## 2020-07-20 LAB — HEMOGLOBIN A1C: Hgb A1c MFr Bld: 6.1 % (ref 4.6–6.5)

## 2020-07-20 NOTE — Patient Instructions (Signed)

## 2020-07-20 NOTE — Progress Notes (Signed)
Office Note 07/20/2020  CC:  Chief Complaint  Patient presents with  . Annual Exam    pt is fasting    HPI:  Tyler [name] is a 69 y.o. White male who is here for annual health maintenance exam and 6 mo f/u CAD, HLD, IFG. He has CAD, is s/p STEMI and CABG 12/2018, has preserved LV fxn.  Feeling well. Compliant with statin, ASA, BB w/out any side effect. Not exercising much, trying to get back into habit of this though. Has plans for routine cardiology f/u pretty soon, asks for labs to be forwarded to his cardiologist, Dr. Ellyn Hack.   Past Medical History:  Diagnosis Date  . BPH (benign prostatic hypertrophy)    Flomax helpful 2018/19  . Chronic pain of right knee    Left knee as well (osteoarthritis).  Dr. Durward Fortes  . Colon polyps 2013   NON-adenomatous 2013---recall 10 yrs.  . Elevated PSA 07/10/2016   Dr. Gaynelle Arabian saw him 07/15/16, did f/u PSA and it was 2.78: f/u was recommended but pt did not do this as of 07/2017.  Repeat PSA here 07/2017 was 3.1, with 19% free (free a little low).  Pt then followed up with Dr. Gaynelle Arabian and tx for BPH w/plan of repeat PSA 1 yr.  PSA 07/2018 down to 3.21.  [name] Erectile dysfunction    Urol rx'd sildenafil 20mg  tabs 11/2017.  [name] Fatigue    + excessive daytime somnolence  . GERD (gastroesophageal reflux disease)   . Hallux limitus 04/2016   1st MPJ joint R foot.  Diclofenac helpful.  Injected by podiatrist 11/2016.  [name] Hyperlipidemia   . Hypogonadism male   . IFG (impaired fasting glucose)    A1c 5.8% 2014  . 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  LV function normal on Intra-Op TEE  . NSTEMI (non-ST elevated myocardial infarction) ([place]) 12/2018   Critical left main and proximal circumflex disease--CABG x2 (Dr. Servando Snare.  LIMA-LAD, SVG-LCx)  . Postoperative atrial fibrillation ([place]) 12/2018   Converted on amiodarone; amio likely to  be only short term    Past Surgical History:  Procedure Laterality Date  . COLONOSCOPY  1998 & 2013    Dr [name]; 2 benign polyps 2013--recall 2023.  [name] CORONARY ARTERY BYPASS GRAFT N/A 12/08/2018   Procedure: CORONARY ARTERY BYPASS GRAFTING (CABG) x 2, SVG TO  OM1, LIMA TO LAD, USING LEFT INTERNAL MAMMARY ARTERY AND RIGHT GREATER SAPHENOUS VEIN HARVESTED ENDOSCOPICALLY;  Surgeon: Grace Isaac, MD;  Location: [facility];  Service: Open Heart Surgery;  Laterality: N/A;  . hydrocoelectomy    . LEFT HEART CATH AND CORONARY ANGIOGRAPHY N/A 12/07/2018   Procedure: LEFT HEART CATH AND CORONARY ANGIOGRAPHY;  Surgeon: Jettie Booze, MD;  Location: [facility];; Ost LM 75%-ulcerated, pCx 90%, EF 35-45% --> referred for CABG  . nocturnal polysomn  1990s   Sleep lab: no OSA (??)--Dr. [name] said he needs another sleep study as of 2013  . ORBITAL FRACTURE SURGERY    . TEE WITHOUT CARDIOVERSION N/A 12/08/2018   Procedure: TRANSESOPHAGEAL ECHOCARDIOGRAM (TEE);  Surgeon: Grace Isaac, MD;  Location: [facility];  Service: Open Heart Surgery;  Laterality: N/A;  . TONSILLECTOMY AND ADENOIDECTOMY    . TRANSTHORACIC ECHOCARDIOGRAM  12/07/2018   In setting of non-STEMI: EF 55-60%.  GR 1 DD.  Mild inferior-inferolateral and apical lateral HK.  Normal RV.  Normal  valves  . UPPER GI ENDOSCOPY  2000 & 2011   dilation 2000    Family History  Problem Relation Age of Onset  . Cancer Mother        Gynecologic  . COPD Mother   . Heart attack Father 83        CHF  . Heart attack Maternal Grandfather        > 55  . Heart attack Paternal Grandfather        >55  . Colon cancer Neg Hx   . Stomach cancer Neg Hx   . Diabetes Neg Hx   . Stroke Neg Hx     Social History   Socioeconomic History  . Marital status: Married    Spouse name: Not on file  . Number of children: 1  . Years of education: Not on file  . Highest education level: Not on file  Occupational History  . Not on file  Tobacco Use   . Smoking status: Never Smoker  . Smokeless tobacco: Never Used  Vaping Use  . Vaping Use: Never used  Substance and Sexual Activity  . Alcohol use: Yes    Alcohol/week: 1.0 standard drink    Types: 1 Cans of beer per week    Comment:  2 beers/ month  . Drug use: No  . Sexual activity: Not on file  Other Topics Concern  . Not on file  Social History Narrative   Married, 1 son.   Lives in [place].   Product/process development scientist -self-employed   No tob, no alc, no drugs.      Social Determinants of Health   Financial Resource Strain:   . Difficulty of Paying Living Expenses: Not on file  Food Insecurity:   . Worried About Charity fundraiser in the Last Year: Not on file  . Ran Out of Food in the Last Year: Not on file  Transportation Needs:   . Lack of Transportation (Medical): Not on file  . Lack of Transportation (Non-Medical): Not on file  Physical Activity:   . Days of Exercise per Week: Not on file  . Minutes of Exercise per Session: Not on file  Stress:   . Feeling of Stress : Not on file  Social Connections:   . Frequency of Communication with Friends and Family: Not on file  . Frequency of Social Gatherings with Friends and Family: Not on file  . Attends Religious Services: Not on file  . Active Member of Clubs or Organizations: Not on file  . Attends Archivist Meetings: Not on file  . Marital Status: Not on file  Intimate Partner Violence:   . Fear of Current or Ex-Partner: Not on file  . Emotionally Abused: Not on file  . Physically Abused: Not on file  . Sexually Abused: Not on file    Outpatient Medications Prior to Visit  Medication Sig Dispense Refill  . aspirin EC 81 MG tablet Take 81 mg by mouth daily.    [name] atorvastatin (LIPITOR) 80 MG tablet TAKE 1 TABLET (80 MG TOTAL) BY MOUTH DAILY AT 6 PM. 90 tablet 2  . metoprolol tartrate (LOPRESSOR) 25 MG tablet Take 1 tablet (25 mg total) by mouth 2 (two) times daily. 180 tablet 3  .  Multiple Vitamins-Minerals (MULTIVITAMIN MEN PO) Take by mouth daily.    [name] omeprazole (PRILOSEC) 20 MG capsule Take 20 mg by mouth daily.     No facility-administered medications prior to visit.  Allergies  Allergen Reactions  . Sulfonamide Derivatives     REACTION: rash Because of a history of documented adverse serious drug reaction;Medi Alert bracelet  is recommended    ROS Review of Systems  Constitutional: Negative for appetite change, chills, fatigue and fever.  HENT: Negative for congestion, dental problem, ear pain and sore throat.   Eyes: Negative for discharge, redness and visual disturbance.  Respiratory: Negative for cough, chest tightness, shortness of breath and wheezing.   Cardiovascular: Negative for chest pain, palpitations and leg swelling.  Gastrointestinal: Negative for abdominal pain, blood in stool, diarrhea, nausea and vomiting.  Genitourinary: Negative for difficulty urinating, dysuria, flank pain, frequency, hematuria and urgency.  Musculoskeletal: Negative for arthralgias, back pain, joint swelling, myalgias and neck stiffness.  Skin: Negative for pallor and rash.  Neurological: Negative for dizziness, speech difficulty, weakness and headaches.  Hematological: Negative for adenopathy. Does not bruise/bleed easily.  Psychiatric/Behavioral: Negative for confusion and sleep disturbance. The patient is not nervous/anxious.     PE; Vitals with BMI 07/20/2020 04/06/2020 01/13/2020  Height 5' 9.25" 5\' 11"  5\' 11"   Weight 233 lbs 10 oz 236 lbs 10 oz 233 lbs 10 oz  BMI 34.25 16.10 96.04  Systolic 540 981 191  Diastolic 66 68 80  Pulse 59 65 82   Gen: Alert, well appearing.  Patient is oriented to person, place, time, and situation. AFFECT: pleasant, lucid thought and speech. ENT: Ears: EACs clear, normal epithelium.  TMs with good light reflex and landmarks bilaterally.  Eyes: no injection, icteris, swelling, or exudate.  EOMI, PERRLA. Nose: no drainage or  turbinate edema/swelling.  No injection or focal lesion.  Mouth: lips without lesion/swelling.  Oral mucosa pink and moist.  Dentition intact and without obvious caries or gingival swelling.  Oropharynx without erythema, exudate, or swelling.  Neck: supple/nontender.  No LAD, mass, or TM.  Carotid pulses 2+ bilaterally, without bruits. CV: RRR, no m/r/g.   LUNGS: CTA bilat, nonlabored resps, good aeration in all lung fields. ABD: soft, NT, ND, BS normal.  No hepatospenomegaly or mass.  No bruits. EXT: no clubbing, cyanosis, or edema.  Musculoskeletal: no joint swelling, erythema, warmth, or tenderness.  ROM of all joints intact. Skin - no sores or suspicious lesions or rashes or color changes   Pertinent labs:  Lab Results  Component Value Date   TSH 2.41 07/09/2016   Lab Results  Component Value Date   WBC 6.4 07/18/2019   HGB 14.6 07/18/2019   HCT 42.9 07/18/2019   MCV 92.7 07/18/2019   PLT 182.0 07/18/2019   Lab Results  Component Value Date   CREATININE 0.89 01/19/2020   BUN 15 01/19/2020   NA 138 01/19/2020   K 4.3 01/19/2020   CL 109 01/19/2020   CO2 25 01/19/2020   Lab Results  Component Value Date   ALT 24 01/19/2020   AST 21 01/19/2020   ALKPHOS 81 01/19/2020   BILITOT 0.6 01/19/2020   Lab Results  Component Value Date   CHOL 109 01/19/2020   Lab Results  Component Value Date   HDL 36.20 (L) 01/19/2020   Lab Results  Component Value Date   LDLCALC 58 01/19/2020   Lab Results  Component Value Date   TRIG 77.0 01/19/2020   Lab Results  Component Value Date   CHOLHDL 3 01/19/2020   Lab Results  Component Value Date   PSA 2.13 07/18/2019   PSA 3.21 07/14/2018   PSA 4.28 (H) 07/09/2016   Lab Results  Component Value Date   HGBA1C 5.9 01/19/2020   ASSESSMENT AND PLAN:    Health maintenance exam: Reviewed age and gender appropriate health maintenance issues (prudent diet, regular exercise, health risks of tobacco and excessive alcohol, use of  seatbelts, fire alarms in home, use of sunscreen).  Also reviewed age and gender appropriate health screening as well as vaccine recommendations. Vaccines:  ALL UTD.  Flu->given today.  Covid 19->UTD. Labs: CBC, CMET, FLP, a1c, PSA. Prostate ca screening: PSA. Colon ca screening:  Next colonoscopy due 2023.  CAD, hx of MI and CABG back in 2020. Feeling well, needs to get back into habit of regular exercise. Cont ASA, BB, statin.  Will forward today's lab results to Dr. Ellyn Hack.  An After Visit Summary was printed and given to the patient.  FOLLOW UP:  Return in about 1 year (around 07/20/2021) for annual CPE (fasting).  Signed:  Crissie Sickles, MD           07/20/2020

## 2020-08-28 IMAGING — CR CHEST - 2 VIEW
2 series · 2 of 2 positions shown · non-contrast
Comparison: 10/25/2007

CLINICAL DATA: Intermittent chest pain for 2 weeks

EXAM:
CHEST - 2 VIEW

[w chest pa]
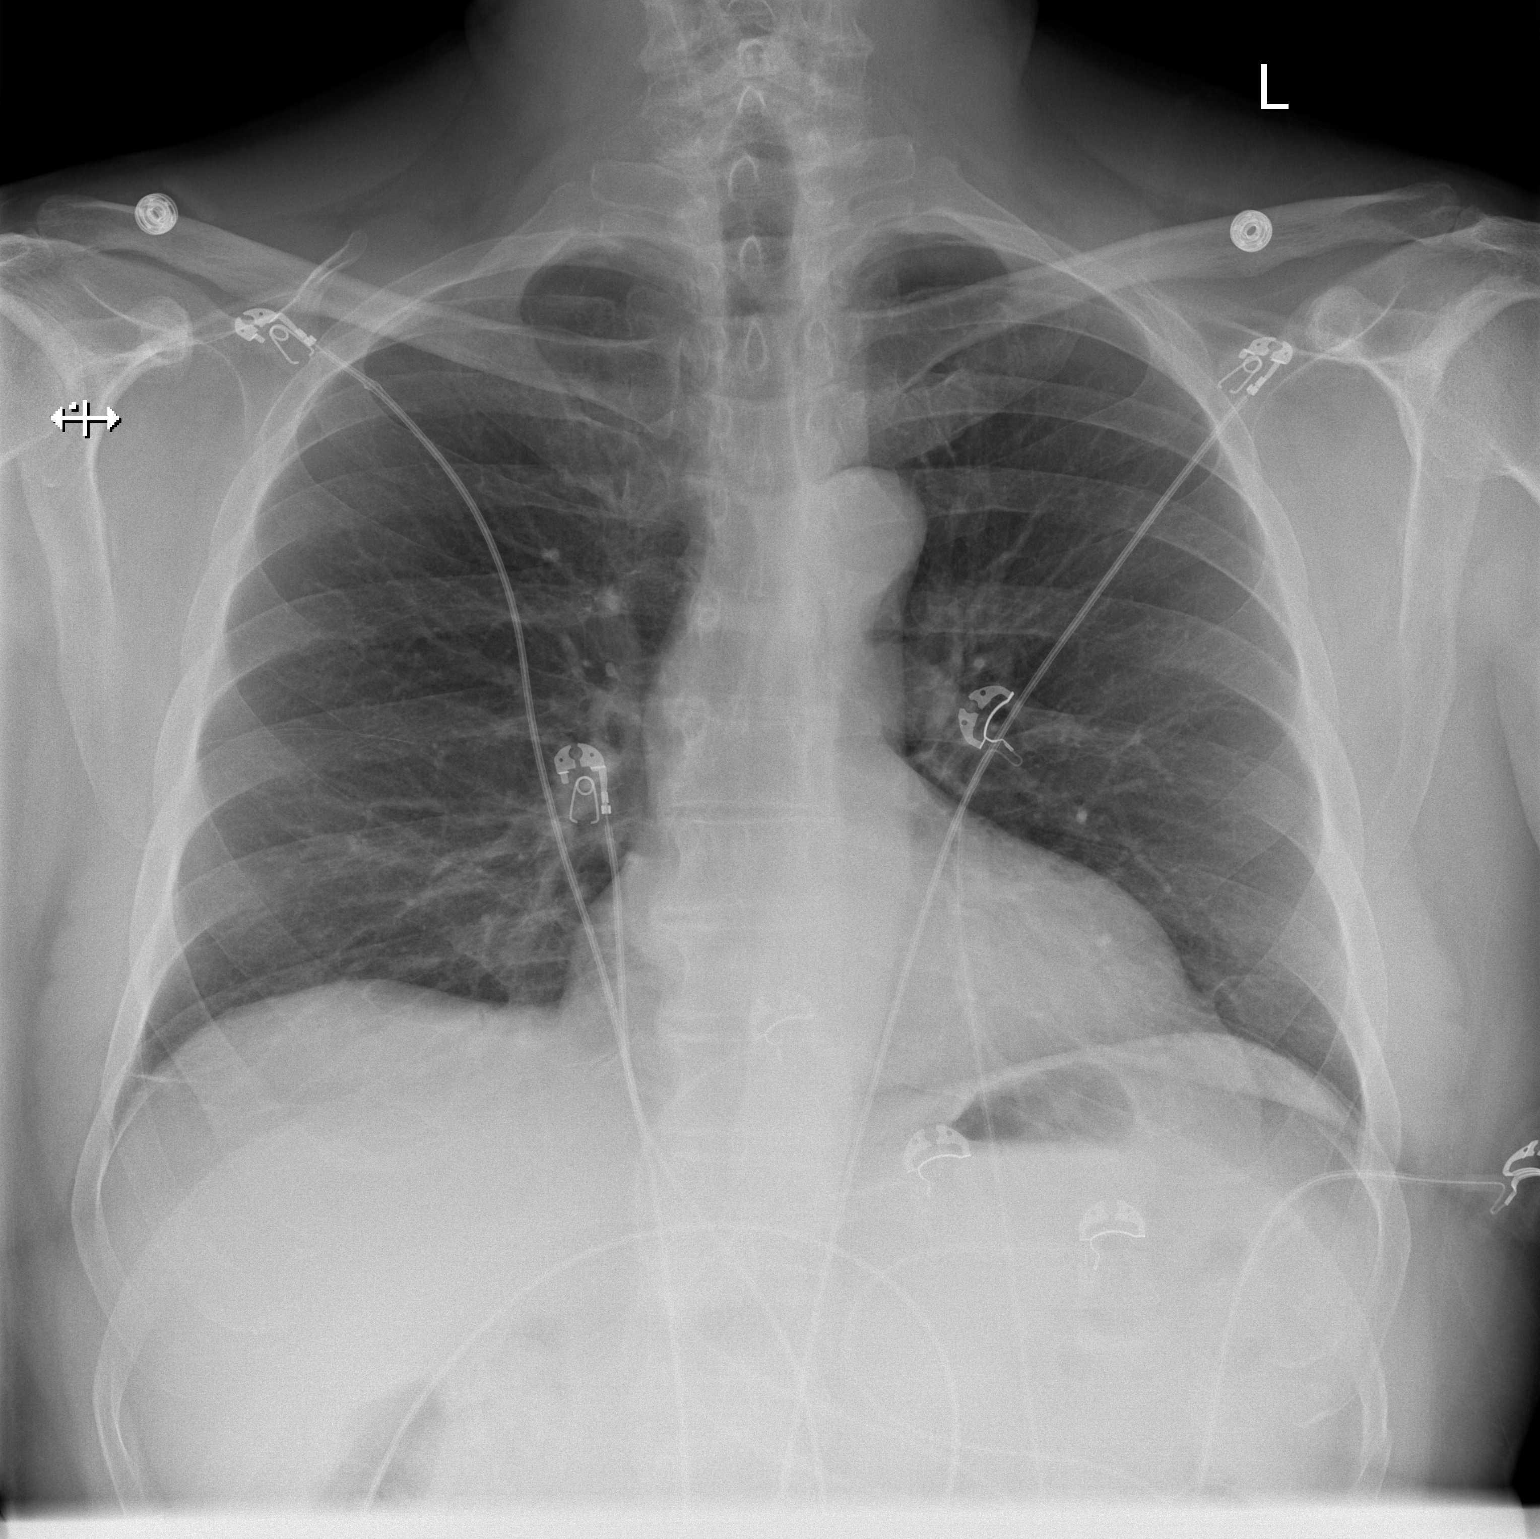

[w chest lat]
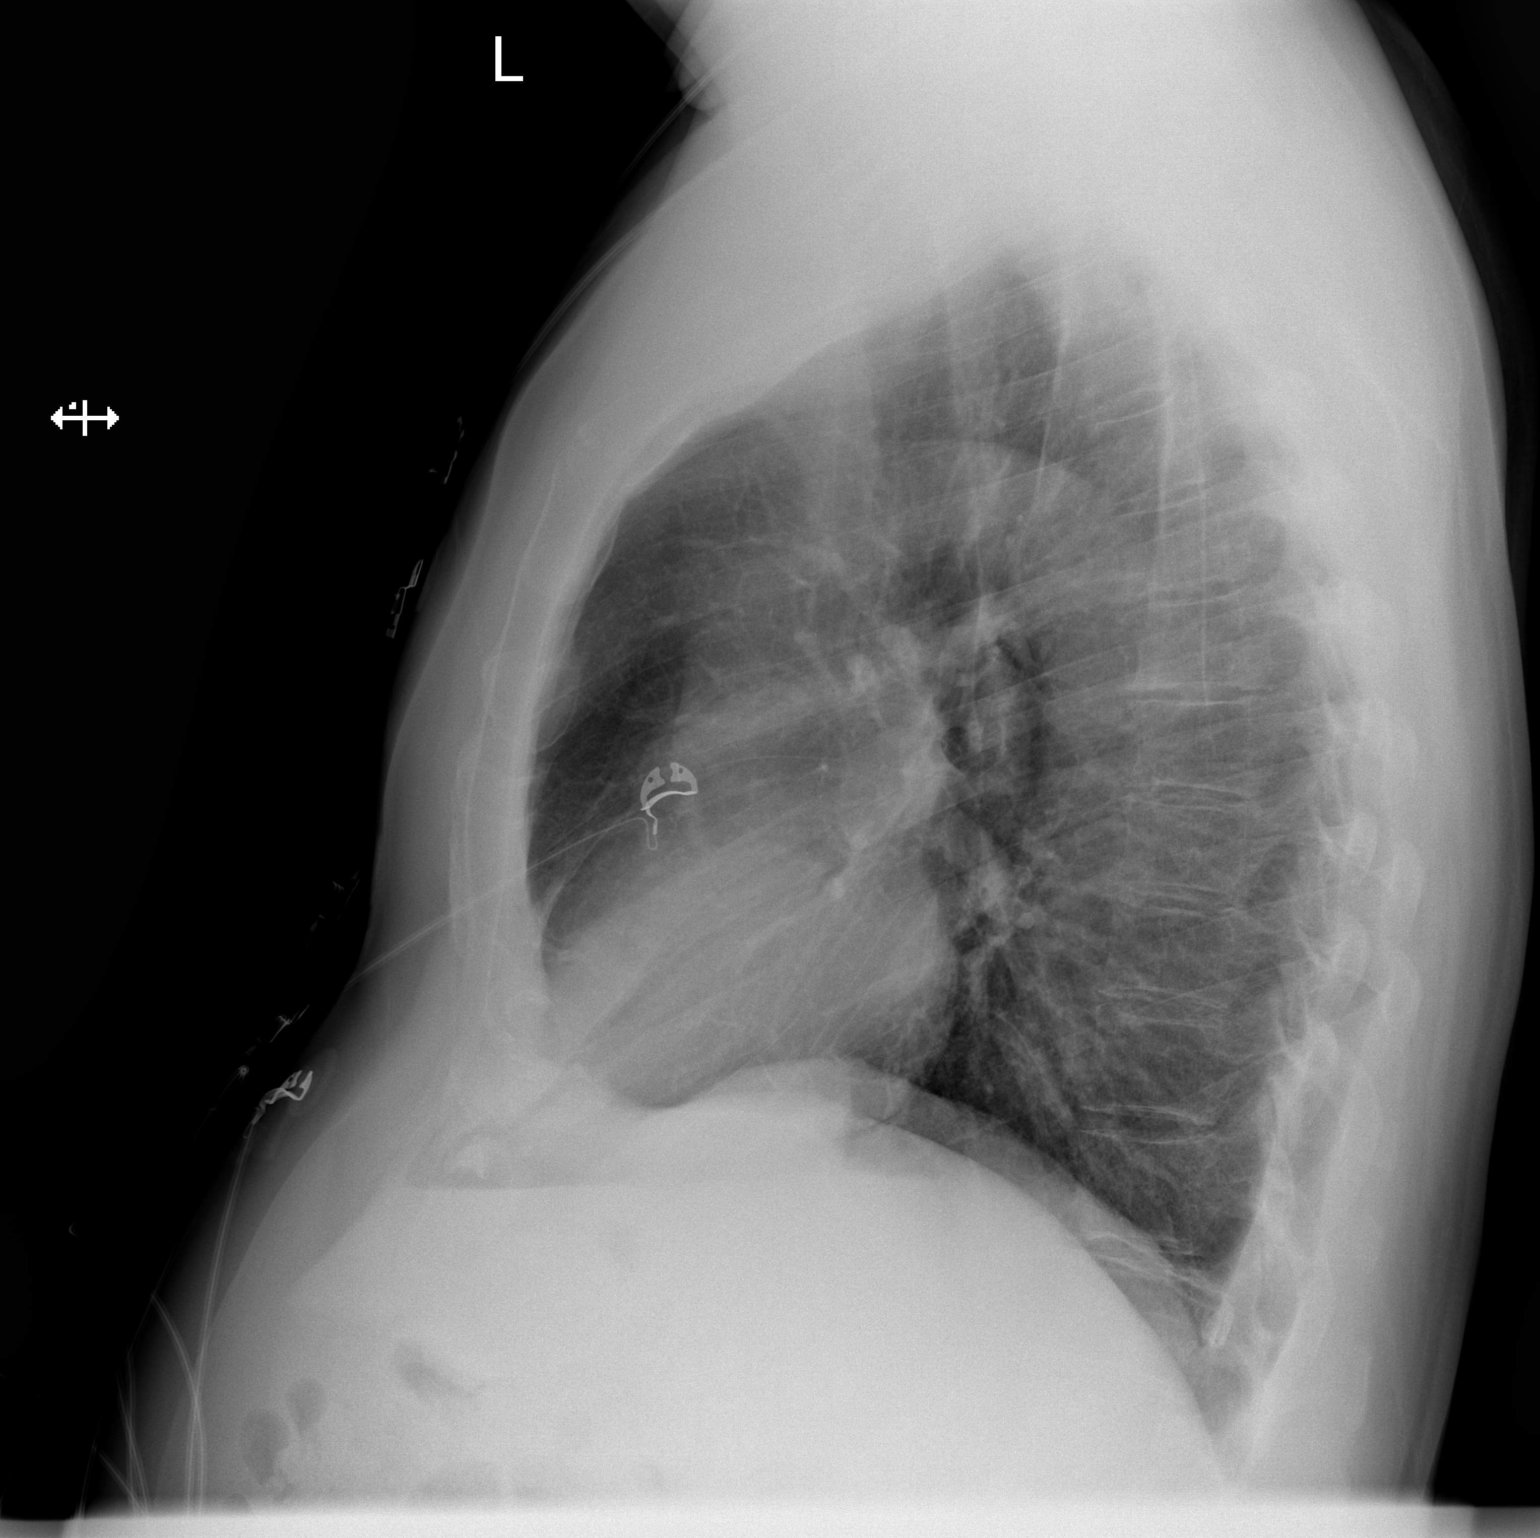

[2 of 2 positions shown; findings below may reference images not displayed]

FINDINGS: Normal heart size, mediastinal contours, and pulmonary vascularity.

Mild RIGHT basilar atelectasis.

Lungs otherwise clear.

No infiltrate, pleural effusion or pneumothorax.

Bones unremarkable.
IMPRESSION: Mild RIGHT basilar atelectasis.

## 2020-08-31 IMAGING — DX PORTABLE CHEST - 1 VIEW
1 series · 1 of 1 positions shown · non-contrast
Comparison: 12/08/2018

CLINICAL DATA: Status post coronary bypass grafting

EXAM:
PORTABLE CHEST 1 VIEW

[chest]
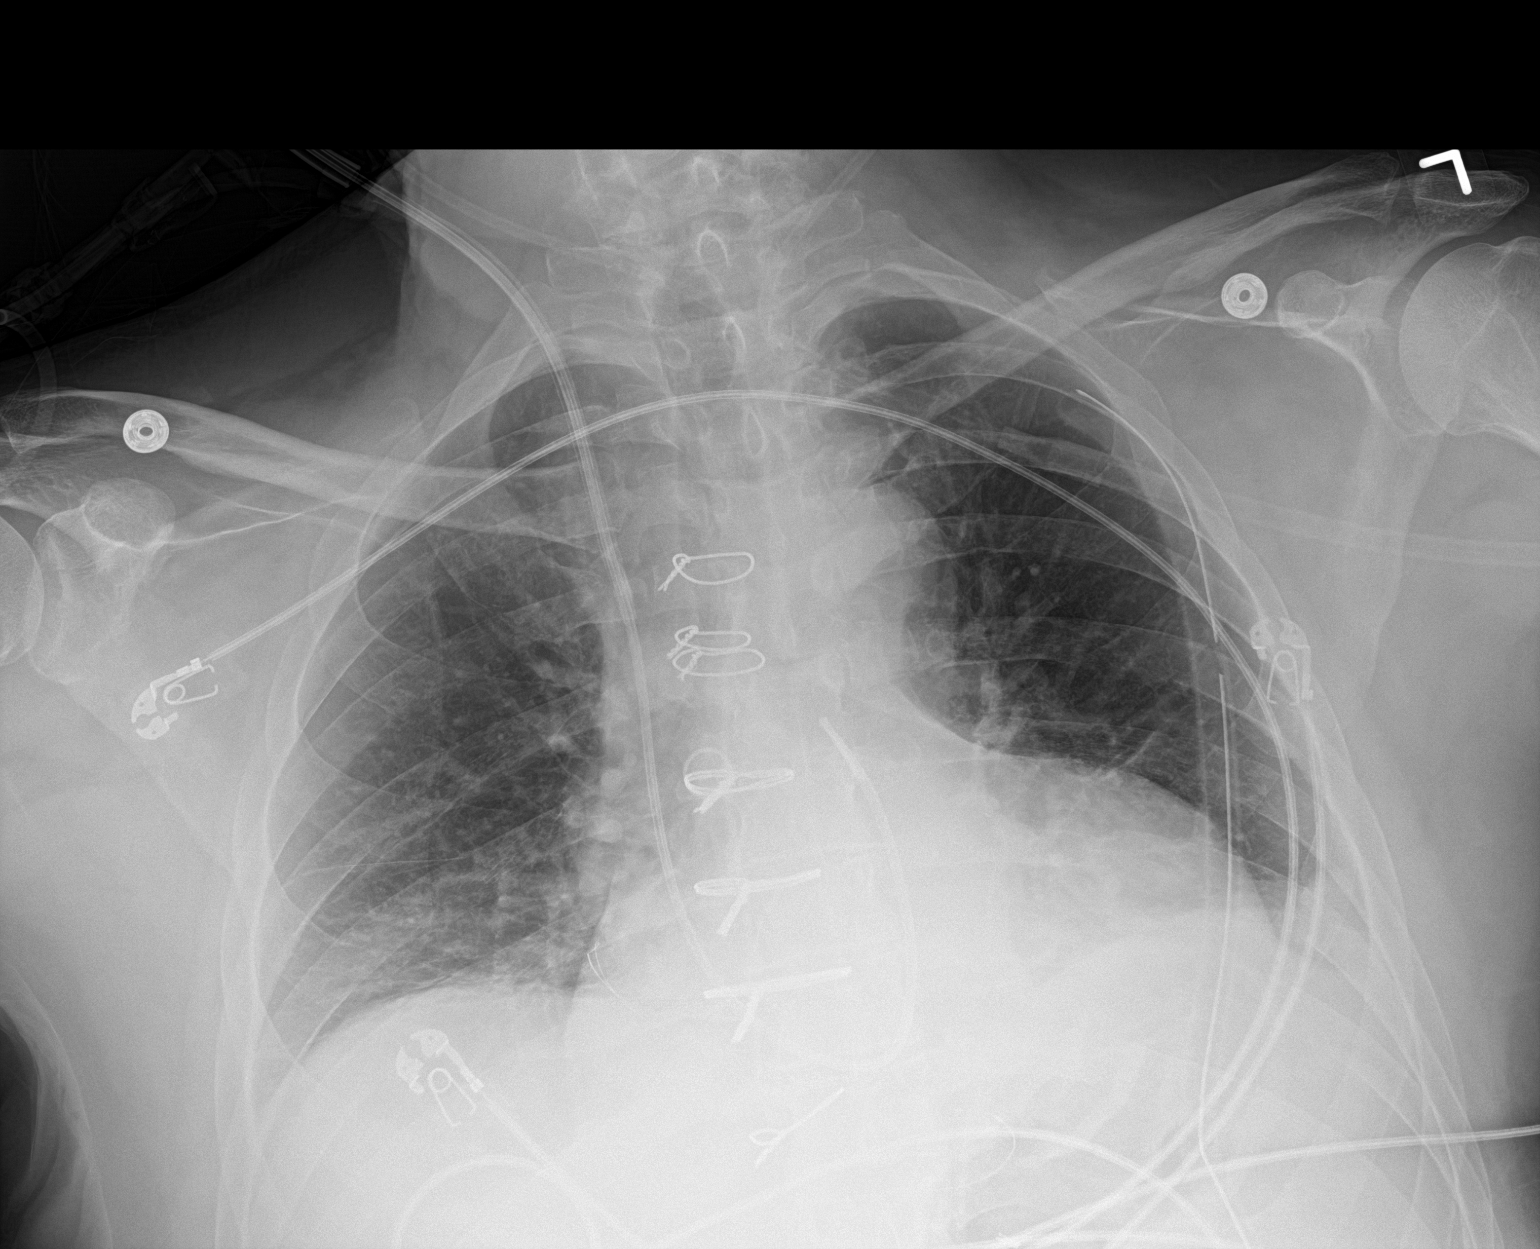

[1 of 1 positions shown; findings below may reference images not displayed]

FINDINGS: Endotracheal tube and gastric catheter have been removed in the
interval. Swan-Ganz catheter again lies in the pulmonary outflow
tract. Left-sided thoracostomy catheter is seen and stable. No
pneumothorax is noted. Cardiac shadow is stable. Mild bibasilar
atelectasis is noted. No bony abnormality is seen.
IMPRESSION: Tubes and lines as described above.

Mild bibasilar atelectasis.

## 2020-09-01 IMAGING — DX PORTABLE CHEST - 1 VIEW
1 series · 1 of 1 positions shown · non-contrast
Comparison: 12/09/2018

CLINICAL DATA: Chest soreness, post CABG

EXAM:
PORTABLE CHEST 1 VIEW

[chest]
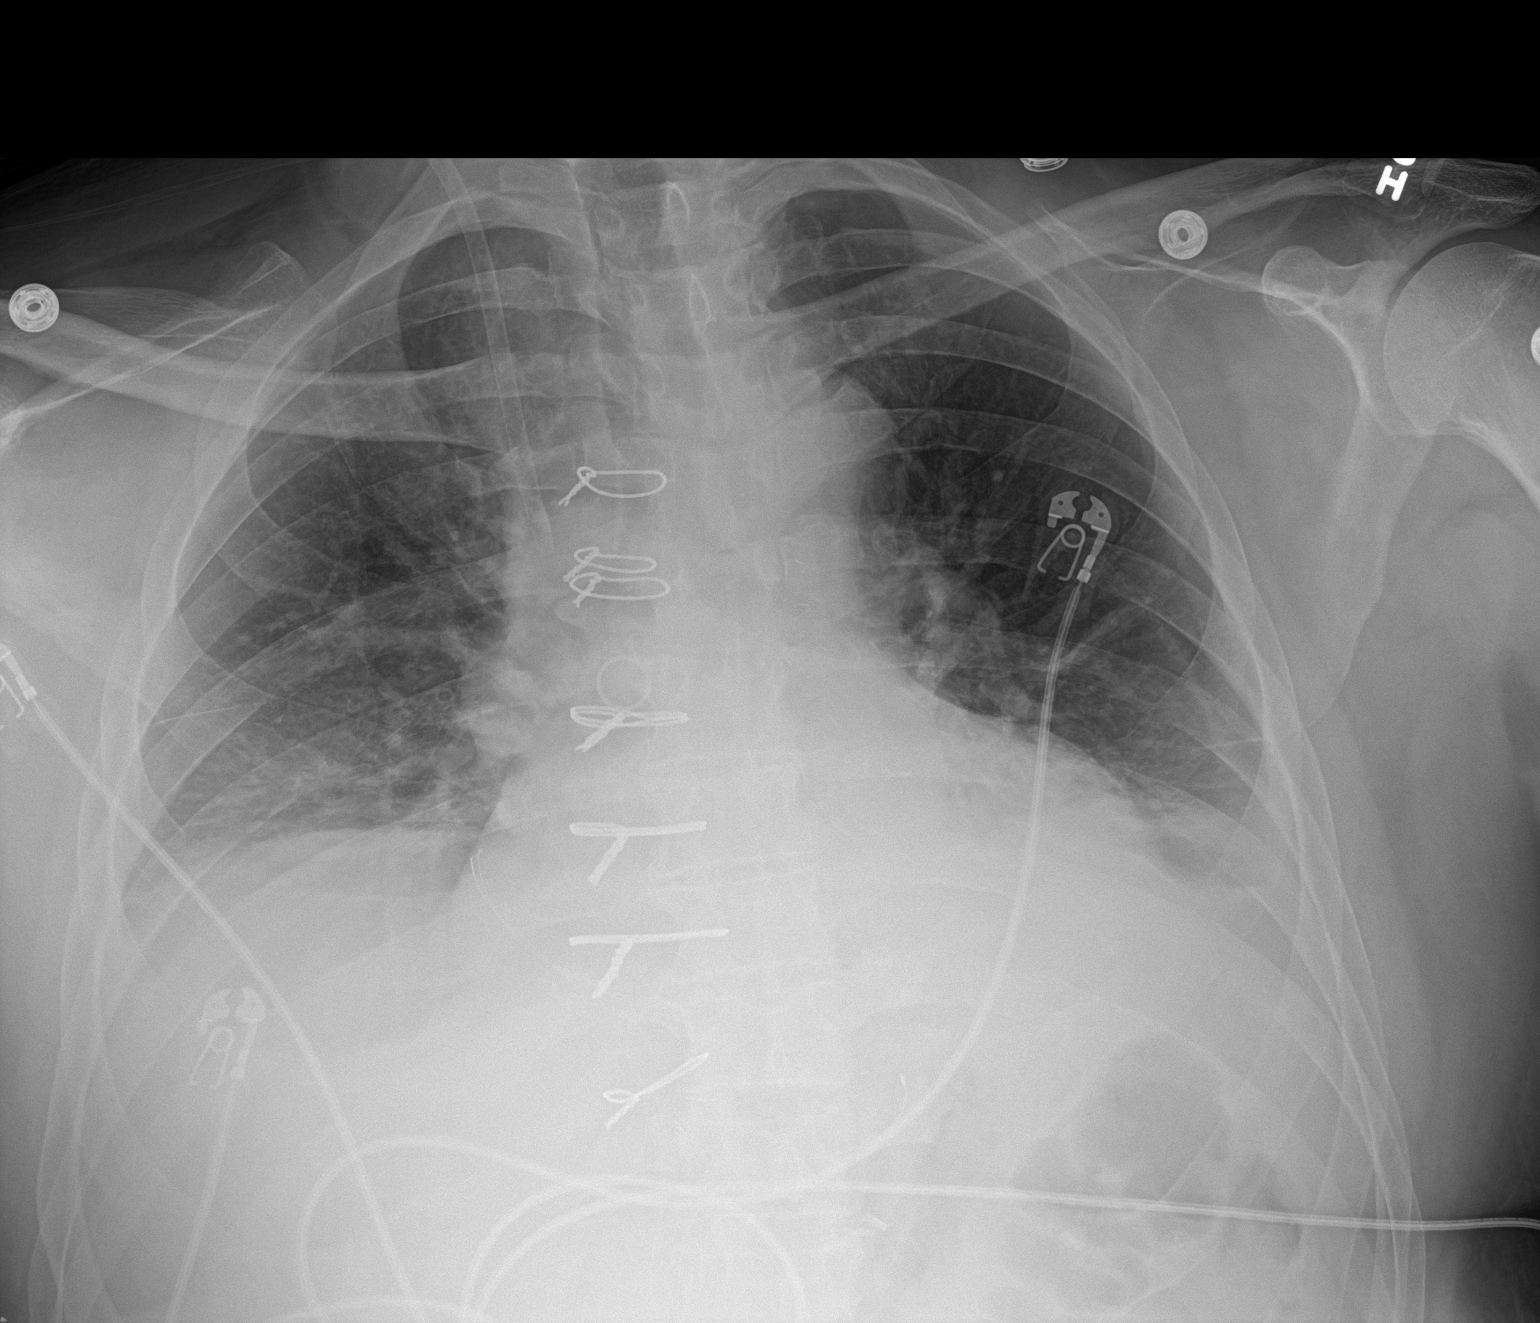

[1 of 1 positions shown; findings below may reference images not displayed]

FINDINGS: Interval removal of Swan-Ganz catheter. Removal of left chest tube.
No pneumothorax. Mild cardiomegaly. Changes of CABG. Small layering
effusions and bibasilar atelectasis, slightly increased since prior
study.
IMPRESSION: Interval removal of left chest tube. No pneumothorax. Small layering
effusions and bibasilar atelectasis.

## 2020-09-17 ENCOUNTER — Other Ambulatory Visit: Payer: Self-pay | Admitting: Adult Health

## 2020-10-08 ENCOUNTER — Ambulatory Visit: Payer: Medicare HMO | Admitting: Podiatry

## 2020-10-08 ENCOUNTER — Other Ambulatory Visit: Payer: Self-pay

## 2020-10-08 ENCOUNTER — Ambulatory Visit (INDEPENDENT_AMBULATORY_CARE_PROVIDER_SITE_OTHER): Payer: Medicare HMO

## 2020-10-08 DIAGNOSIS — M79671 Pain in right foot: Secondary | ICD-10-CM

## 2020-10-08 DIAGNOSIS — M7751 Other enthesopathy of right foot: Secondary | ICD-10-CM

## 2020-10-08 DIAGNOSIS — M79672 Pain in left foot: Secondary | ICD-10-CM | POA: Diagnosis not present

## 2020-10-08 DIAGNOSIS — M779 Enthesopathy, unspecified: Secondary | ICD-10-CM

## 2020-10-08 DIAGNOSIS — M205X1 Other deformities of toe(s) (acquired), right foot: Secondary | ICD-10-CM | POA: Diagnosis not present

## 2020-10-08 DIAGNOSIS — Q828 Other specified congenital malformations of skin: Secondary | ICD-10-CM

## 2020-10-08 MED ORDER — TRIAMCINOLONE ACETONIDE 10 MG/ML IJ SUSP
10.0000 mg | Freq: Once | INTRAMUSCULAR | Status: AC
  Administered 2020-10-08: 10 mg

## 2020-10-08 NOTE — Progress Notes (Signed)
Subjective:   Patient ID: Tyler [name], male   DOB: 70 y.o.   MRN: [id number]   HPI Patient points to the outside of the right foot states that she is got several lesions which become inflamed and that he has developed fluid around his fifth metatarsal phalangeal joint that is painful when he stepped down on it along with reduced range of motion with spur of the big toe joint right   ROS      Objective:  Physical Exam  Neurovascular status found to be intact with inflammation fluid around the fifth MPJ right moderately painful when pressed and a lesion x3 plantar right with a lucent type core for all lesions measuring about 3 x 3 mm for each lesion.  Significant range of motion loss first MPJ right history of pain with enlargement consistent with probable bone spur     Assessment:  Inflammatory capsulitis fifth MPJ right porokeratosis hallux limitus condition      Plan:  H&P all conditions reviewed and today I did a sterile prep and injected the capsule 3 mg dexamethasone 5 mg Xylocaine and I debrided lesions right I advised this patient on hallux limitus and discussed possible correction of deformity that could be done at 1 point in future.  Patient will be seen back to recheck and is encouraged to call with questions with education rendered today  X-rays indicate large spur first metatarsal head right narrowing of the joint surface with no spur left joint relatively healthy

## 2020-11-04 ENCOUNTER — Other Ambulatory Visit: Payer: Self-pay | Admitting: Cardiology

## 2020-11-20 DIAGNOSIS — M9905 Segmental and somatic dysfunction of pelvic region: Secondary | ICD-10-CM | POA: Diagnosis not present

## 2020-11-20 DIAGNOSIS — M25551 Pain in right hip: Secondary | ICD-10-CM | POA: Diagnosis not present

## 2020-11-20 DIAGNOSIS — M9903 Segmental and somatic dysfunction of lumbar region: Secondary | ICD-10-CM | POA: Diagnosis not present

## 2020-11-20 DIAGNOSIS — M5116 Intervertebral disc disorders with radiculopathy, lumbar region: Secondary | ICD-10-CM | POA: Diagnosis not present

## 2020-11-22 DIAGNOSIS — M9903 Segmental and somatic dysfunction of lumbar region: Secondary | ICD-10-CM | POA: Diagnosis not present

## 2020-11-22 DIAGNOSIS — M9905 Segmental and somatic dysfunction of pelvic region: Secondary | ICD-10-CM | POA: Diagnosis not present

## 2020-11-22 DIAGNOSIS — M5116 Intervertebral disc disorders with radiculopathy, lumbar region: Secondary | ICD-10-CM | POA: Diagnosis not present

## 2020-11-22 DIAGNOSIS — M25551 Pain in right hip: Secondary | ICD-10-CM | POA: Diagnosis not present

## 2021-02-06 ENCOUNTER — Telehealth: Payer: Self-pay | Admitting: Family Medicine

## 2021-02-06 NOTE — Telephone Encounter (Signed)
Pt advised referral not needed. Office contact # provided, [phone]. He is not due for one until next year but he will call them to initiate process.

## 2021-02-06 NOTE — Telephone Encounter (Signed)
Please advise, thanks.   Pt last seen 07/20/20 CPE

## 2021-02-06 NOTE — Telephone Encounter (Signed)
Dr. [name] with [facility] did his colonoscopy back in 2013 so pt should not need a new referral. Pls give him the number for [facility] so pt can call and set this up on his own.-thx

## 2021-02-06 NOTE — Telephone Encounter (Signed)
Pt called and said he is due for his colonoscopy and asked if referral could be placed to [name]

## 2021-03-07 DIAGNOSIS — A6920 Lyme disease, unspecified: Secondary | ICD-10-CM | POA: Diagnosis not present

## 2021-03-07 DIAGNOSIS — W57XXXA Bitten or stung by nonvenomous insect and other nonvenomous arthropods, initial encounter: Secondary | ICD-10-CM | POA: Diagnosis not present

## 2021-04-25 ENCOUNTER — Other Ambulatory Visit: Payer: Self-pay | Admitting: Cardiology

## 2021-05-29 ENCOUNTER — Other Ambulatory Visit: Payer: Self-pay | Admitting: Adult Health

## 2021-05-30 ENCOUNTER — Ambulatory Visit (INDEPENDENT_AMBULATORY_CARE_PROVIDER_SITE_OTHER): Payer: Medicare HMO | Admitting: *Deleted

## 2021-05-30 DIAGNOSIS — Z0000 Encounter for general adult medical examination without abnormal findings: Secondary | ICD-10-CM | POA: Diagnosis not present

## 2021-05-30 NOTE — Progress Notes (Signed)
Subjective:   Tyler [name] is a 70 y.o. male who presents for Medicare Annual/Subsequent preventive   I connected with  Tyler [name] on 05/30/21 by a telephone enabled telemedicine application and verified that I am speaking with the correct person using two identifiers.   I discussed the limitations of evaluation and management by telemedicine. The patient expressed understanding and agreed to proceed. examination.  Review of Systems     Cardiac Risk Factors include: advanced age (>27men, >39 women);hypertension     Objective:    Today's Vitals   There is no height or weight on file to calculate BMI.  Advanced Directives 05/30/2021 12/06/2018 12/06/2018 07/13/2015  Does Patient Have a Medical Advance Directive? Yes Yes No No  Type of Advance Directive Living will [place];Living will - -  Does patient want to make changes to medical advance directive? - No - Patient declined - -  Copy of [place] in Chart? - No - copy requested - -  Would patient like information on creating a medical advance directive? - - - No - patient declined information    Current Medications (verified) Outpatient Encounter Medications as of 05/30/2021  Medication Sig   aspirin EC 81 MG tablet Take 81 mg by mouth daily.   atorvastatin (LIPITOR) 80 MG tablet TAKE 1 TABLET BY MOUTH DAILY AT 6 PM.   metoprolol tartrate (LOPRESSOR) 25 MG tablet TAKE 1 TABLET BY MOUTH TWICE A DAY   Multiple Vitamins-Minerals (MULTIVITAMIN MEN PO) Take by mouth daily.   omeprazole (PRILOSEC) 20 MG capsule Take 20 mg by mouth daily.   No facility-administered encounter medications on file as of 05/30/2021.    Allergies (verified) Sulfonamide derivatives   History: Past Medical History:  Diagnosis Date   BPH (benign prostatic hypertrophy)    Flomax not helpful 2018/19 per pt report   Chronic pain of right knee    Left knee as well (osteoarthritis).  Dr. Durward Fortes   Colon  polyps 2013   NON-adenomatous 2013---recall 10 yrs.   Elevated PSA 07/10/2016   Dr. Gaynelle Arabian saw him 07/15/16, did f/u PSA and it was 2.78: f/u was recommended but pt did not do this as of 07/2017.  Repeat PSA here 07/2017 was 3.1, with 19% free (free a little low).  Pt then followed up with Dr. Gaynelle Arabian and tx for BPH w/plan of repeat PSA 1 yr.  PSA 07/2018 down to 3.21.   Erectile dysfunction    Urol rx'd sildenafil 20mg  tabs 11/2017.   Fatigue    + excessive daytime somnolence   GERD (gastroesophageal reflux disease)    Hallux limitus 04/2016   1st MPJ joint R foot.  Diclofenac helpful.  Injected by podiatrist 11/2016.   Hyperlipidemia    Hypogonadism male    Left main coronary artery disease 12/07/2018   NSTEMI:  ost LM ulcerated 75%, pCx 90%, EF 35-45% --> CABG x2 (LIMA-LAD, SVG-Cx) 12/14/2018.  LV function normal on Intra-Op TEE   NSTEMI (non-ST elevated myocardial infarction) ([place]) 12/2018   Critical left main and proximal circumflex disease--CABG x2 (Dr. Servando Snare.  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   CORONARY ARTERY  BYPASS GRAFT N/A 12/08/2018   Procedure: CORONARY ARTERY BYPASS GRAFTING (CABG) x 2, SVG TO  OM1, LIMA TO LAD, USING LEFT INTERNAL MAMMARY ARTERY AND RIGHT GREATER SAPHENOUS VEIN HARVESTED ENDOSCOPICALLY;  Surgeon: Grace Isaac, MD;  Location: [facility];  Service: Open Heart Surgery;  Laterality: N/A;   hydrocoelectomy     LEFT HEART CATH AND CORONARY ANGIOGRAPHY N/A 12/07/2018   Procedure: LEFT HEART CATH AND CORONARY ANGIOGRAPHY;  Surgeon: Jettie Booze, MD;  Location: [facility];; Ost LM 75%-ulcerated, pCx 90%, EF 35-45% --> referred for CABG   nocturnal polysomn  1990s   Sleep  lab: no OSA (??)--Dr. [name] said he needs another sleep study as of 2013   [place]     TEE WITHOUT CARDIOVERSION N/A 12/08/2018   Procedure: TRANSESOPHAGEAL ECHOCARDIOGRAM (TEE);  Surgeon: Grace Isaac, MD;  Location: [facility];  Service: Open Heart Surgery;  Laterality: N/A;   TONSILLECTOMY AND ADENOIDECTOMY     TRANSTHORACIC ECHOCARDIOGRAM  12/07/2018   In setting of non-STEMI: EF 55-60%.  GR 1 DD.  Mild inferior-inferolateral and apical lateral HK.  Normal RV.  Normal valves   UPPER GI ENDOSCOPY  2000 & 2011   dilation 2000   Family History  Problem Relation Age of Onset   Cancer Mother        Gynecologic   COPD Mother    Heart attack Father 81        CHF   Heart attack Maternal Grandfather        > 14   Heart attack Paternal Grandfather        >55   Colon cancer Neg Hx    Stomach cancer Neg Hx    Diabetes Neg Hx    Stroke Neg Hx    Social History   Socioeconomic History   Marital status: Married    Spouse name: Not on file   Number of children: 1   Years of education: Not on file   Highest education level: Not on file  Occupational History   Not on file  Tobacco Use   Smoking status: Never   Smokeless tobacco: Never  Vaping Use   Vaping Use: Never used  Substance and Sexual Activity   Alcohol use: Yes    Alcohol/week: 1.0 standard drink    Types: 1 Cans of beer per week    Comment:  2 beers/ month   Drug use: No   Sexual activity: Yes  Other Topics Concern   Not on file  Social History Narrative   Married, 1 son.   Lives in [place].   Product/process development scientist -self-employed   No tob, no alc, no drugs.      Social Determinants of Health   Financial Resource Strain: Low Risk    Difficulty of Paying Living Expenses: Not hard at all  Food Insecurity: No Food Insecurity   Worried About Charity fundraiser in the Last Year: Never true   [place] in the Last Year: Never true  Transportation Needs: No Transportation Needs    Lack of Transportation (Medical): No   Lack of Transportation (Non-Medical): No  Physical Activity: Insufficiently Active   Days of Exercise per Week: 3 days   Minutes of Exercise per Session: 30 min  Stress: No Stress Concern Present   Feeling of Stress : Not at all  Social Connections: Moderately Integrated   Frequency of Communication with Friends and Family: More than three times a  week   Frequency of Social Gatherings with Friends and Family: More than three times a week   Attends Religious Services: More than 4 times per year   Active Member of [employer] or Organizations: No   Attends Music therapist: Never   Marital Status: Married    Tobacco Counseling Counseling given: Not Answered   Clinical Intake:  Pre-visit preparation completed: Yes  Pain : No/denies pain     Nutritional Risks: None Diabetes: No  How often do you need to have someone help you when you read instructions, pamphlets, or other written materials from your doctor or pharmacy?: 1 - Never  Diabetic?   No  Interpreter Needed?: No  Information entered by :: Leroy Kennedy  LPN   Activities of Daily Living In your present state of health, do you have any difficulty performing the following activities: 05/30/2021  Hearing? N  Vision? N  Difficulty concentrating or making decisions? N  Walking or climbing stairs? N  Dressing or bathing? N  Doing errands, shopping? N  Preparing Food and eating ? N  Using the Toilet? N  In the past six months, have you accidently leaked urine? N  Do you have problems with loss of bowel control? N  Managing your Medications? N  Managing your Finances? N  Housekeeping or managing your Housekeeping? N  Some recent data might be hidden    Patient Care Team: Tammi Sou, MD as PCP - General (Family Medicine) Leonie Man, MD as PCP - Cardiology (Cardiology) Irene Shipper, MD as Consulting Physician (Gastroenterology) Marshell Garfinkel, MD as  Consulting Physician (Pulmonary Disease) Gardiner Barefoot, DPM as Consulting Physician (Podiatry) Carolan Clines, MD (Inactive) as Consulting Physician (Urology) Garald Balding, MD as Consulting Physician (Orthopedic Surgery) Lucas Mallow, MD as Consulting Physician (Urology) Grace Isaac, MD (Inactive) as Consulting Physician (Cardiothoracic Surgery)  Indicate any recent Medical Services you may have received from other than [facility] providers in the past year (date may be approximate).     Assessment:   This is a routine wellness examination for Darl.  Hearing/Vision screen Hearing Screening - Comments:: No trouble hearing No hearing aids Vision Screening - Comments:: Up to date [facility]  Dietary issues and exercise activities discussed: Current Exercise Habits: Home exercise routine, Type of exercise: walking, Time (Minutes): 30, Frequency (Times/Week): 4, Weekly Exercise (Minutes/Week): 120, Intensity: Mild   Goals Addressed             This Visit's Progress    Increase physical activity         Depression Screen PHQ 2/9 Scores 05/30/2021 07/20/2020 07/18/2019 07/14/2018 07/08/2017 07/04/2016 12/26/2014  PHQ - 2 Score 0 0 0 0 0 0 0    Fall Risk Fall Risk  05/30/2021 07/20/2020 07/18/2019 07/14/2018 07/04/2016  Falls in the past year? 0 0 0 0 No  Number falls in past yr: 0 0 0 - -  Injury with Fall? 0 0 0 - -  Risk for fall due to : - - - - -  Follow up Falls evaluation completed;Falls prevention discussed Falls evaluation completed Falls evaluation completed - -    FALL RISK PREVENTION PERTAINING TO THE HOME:  Any stairs in or around the home? Yes  If so, are there any without handrails? No  Home free of loose throw rugs in walkways, pet beds, electrical cords, etc? Yes  Adequate lighting in your home to reduce risk of falls? Yes   ASSISTIVE  DEVICES UTILIZED TO PREVENT FALLS:  Life alert? No  Use of a cane, walker or w/c? No  Grab bars  in the bathroom? Yes  Shower chair or bench in shower? Yes  Elevated toilet seat or a handicapped toilet? Yes   TIMED UP AND GO:  Was the test performed? No .    Cognitive Function:  Normal cognitive status assessed by direct observation by this Nurse Health Advisor. No abnormalities found.          Immunizations Immunization History  Administered Date(s) Administered   Fluad Quad(high Dose 65+) 06/03/2019, 07/20/2020   Influenza Split 07/09/2011, 06/14/2012   Influenza Whole 07/09/2007, 07/05/2010   Influenza, High Dose Seasonal PF 06/22/2018   Influenza,inj,Quad PF,6+ Mos 06/14/2013, 06/19/2014, 06/20/2015, 07/04/2016, 06/25/2017   PFIZER(Purple Top)SARS-COV-2 Vaccination 09/28/2019, 10/19/2019, 06/16/2020   Pneumococcal Conjugate-13 07/18/2019   Pneumococcal Polysaccharide-23 07/14/2018   Td 10/18/2010   Zoster Recombinat (Shingrix) 09/13/2018, 03/08/2019   Zoster, Live 06/22/2013    TDAP status: Due, Education has been provided regarding the importance of this vaccine. Advised may receive this vaccine at local pharmacy or Health Dept. Aware to provide a copy of the vaccination record if obtained from local pharmacy or Health Dept. Verbalized acceptance and understanding.  Flu Vaccine status: Due, Education has been provided regarding the importance of this vaccine. Advised may receive this vaccine at local pharmacy or Health Dept. Aware to provide a copy of the vaccination record if obtained from local pharmacy or Health Dept. Verbalized acceptance and understanding.  Pneumococcal vaccine status: Up to date  Covid-19 vaccine status: Information provided on how to obtain vaccines.   Qualifies for Shingles Vaccine? No   Zostavax completed Yes   Shingrix Completed?: Yes  Screening Tests Health Maintenance  Topic Date Due   COVID-19 Vaccine (4 - Booster for Pfizer series) 09/08/2020   TETANUS/TDAP  10/18/2020   INFLUENZA VACCINE  04/08/2021   COLONOSCOPY (Pts  45-46yrs Insurance coverage will need to be confirmed)  04/23/2022   Hepatitis C Screening  Completed   Zoster Vaccines- Shingrix  Completed   HPV VACCINES  Aged Out    Health Maintenance  Health Maintenance Due  Topic Date Due   COVID-19 Vaccine (4 - Booster for Pfizer series) 09/08/2020   TETANUS/TDAP  10/18/2020   INFLUENZA VACCINE  04/08/2021    Colorectal cancer screening: Type of screening: Colonoscopy. Completed  . Repeat every 5 years  Lung Cancer Screening: (Low Dose CT Chest recommended if Age 77-80 years, 30 pack-year currently smoking OR have quit w/in 15years.) does not qualify.   Lung Cancer Screening Referral  Additional Screening:  Hepatitis C Screening: does not qualify; Completed 2017  Vision Screening: Recommended annual ophthalmology exams for early detection of glaucoma and other disorders of the eye. Is the patient up to date with their annual eye exam?  Yes  Who is the provider or what is the name of the office in which the patient attends annual eye exams? [facility] If pt is not established with a provider, would they like to be referred to a provider to establish care? No .   Dental Screening: Recommended annual dental exams for proper oral hygiene  Community Resource Referral / Chronic Care Management: CRR required this visit?  No   CCM required this visit?  No      Plan:     I have personally reviewed and noted the following in the patient's chart:   Medical and social history Use of alcohol, tobacco  or illicit drugs  Current medications and supplements including opioid prescriptions. Patient is not currently taking opioid prescriptions. Functional ability and status Nutritional status Physical activity Advanced directives List of other physicians Hospitalizations, surgeries, and ER visits in previous 12 months Vitals Screenings to include cognitive, depression, and falls Referrals and appointments  In addition, I have  reviewed and discussed with patient certain preventive protocols, quality metrics, and best practice recommendations. A written personalized care plan for preventive services as well as general preventive health recommendations were provided to patient.     Leroy Kennedy, LPN   12/05/760   Nurse Notes:

## 2021-05-30 NOTE — Patient Instructions (Signed)
[name] , Thank you for taking time to come for your Medicare Wellness Visit. I appreciate your ongoing commitment to your health goals. Please review the following plan we discussed and let me know if I can assist you in the future.   Screening recommendations/referrals: Colonoscopy: Education provided Recommended yearly ophthalmology/optometry visit for glaucoma screening and checkup Recommended yearly dental visit for hygiene and checkup  Vaccinations: Influenza vaccine: Education provided Pneumococcal vaccine: up to date Tdap vaccine: Education provided Shingles vaccine: up to date    Advanced directives: living will not on file  Conditions/risks identified:   Next appointment: 07-22-2021 @ 10:00  Dr. Anitra Lauth  Preventive Care 68 Years and Older, Male Preventive care refers to lifestyle choices and visits with your health care provider that can promote health and wellness. What does preventive care include? A yearly physical exam. This is also called an annual well check. Dental exams once or twice a year. Routine eye exams. Ask your health care provider how often you should have your eyes checked. Personal lifestyle choices, including: Daily care of your teeth and gums. Regular physical activity. Eating a healthy diet. Avoiding tobacco and drug use. Limiting alcohol use. Practicing safe sex. Taking low doses of aspirin every day. Taking vitamin and mineral supplements as recommended by your health care provider. What happens during an annual well check? The services and screenings done by your health care provider during your annual well check will depend on your age, overall health, lifestyle risk factors, and family history of disease. Counseling  Your health care provider may ask you questions about your: Alcohol use. Tobacco use. Drug use. Emotional well-being. Home and relationship well-being. Sexual activity. Eating habits. History of falls. Memory and  ability to understand (cognition). Work and work Statistician. Screening  You may have the following tests or measurements: Height, weight, and BMI. Blood pressure. Lipid and cholesterol levels. These may be checked every 5 years, or more frequently if you are over 68 years old. Skin check. Lung cancer screening. You may have this screening every year starting at age 68 if you have a 30-pack-year history of smoking and currently smoke or have quit within the past 15 years. Fecal occult blood test (FOBT) of the stool. You may have this test every year starting at age 68. Flexible sigmoidoscopy or colonoscopy. You may have a sigmoidoscopy every 5 years or a colonoscopy every 10 years starting at age 68. Prostate cancer screening. Recommendations will vary depending on your family history and other risks. Hepatitis C blood test. Hepatitis B blood test. Sexually transmitted disease (STD) testing. Diabetes screening. This is done by checking your blood sugar (glucose) after you have not eaten for a while (fasting). You may have this done every 1-3 years. Abdominal aortic aneurysm (AAA) screening. You may need this if you are a current or former smoker. Osteoporosis. You may be screened starting at age 68 if you are at high risk. Talk with your health care provider about your test results, treatment options, and if necessary, the need for more tests. Vaccines  Your health care provider may recommend certain vaccines, such as: Influenza vaccine. This is recommended every year. Tetanus, diphtheria, and acellular pertussis (Tdap, Td) vaccine. You may need a Td booster every 10 years. Zoster vaccine. You may need this after age 68. Pneumococcal 13-valent conjugate (PCV13) vaccine. One dose is recommended after age 68. Pneumococcal polysaccharide (PPSV23) vaccine. One dose is recommended after age 68. Talk to your health care provider about which  screenings and vaccines you need and how often you need  them. This information is not intended to replace advice given to you by your health care provider. Make sure you discuss any questions you have with your health care provider. Document Released: 09/21/2015 Document Revised: 05/14/2016 Document Reviewed: 06/26/2015 Elsevier Interactive Patient Education  2017 [place] Prevention in the Home Falls can cause injuries. They can happen to people of all ages. There are many things you can do to make your home safe and to help prevent falls. What can I do on the outside of my home? Regularly fix the edges of walkways and driveways and fix any cracks. Remove anything that might make you trip as you walk through a door, such as a raised step or threshold. Trim any bushes or trees on the path to your home. Use bright outdoor lighting. Clear any walking paths of anything that might make someone trip, such as rocks or tools. Regularly check to see if handrails are loose or broken. Make sure that both sides of any steps have handrails. Any raised decks and porches should have guardrails on the edges. Have any leaves, snow, or ice cleared regularly. Use sand or salt on walking paths during winter. Clean up any spills in your garage right away. This includes oil or grease spills. What can I do in the bathroom? Use night lights. Install grab bars by the toilet and in the tub and shower. Do not use towel bars as grab bars. Use non-skid mats or decals in the tub or shower. If you need to sit down in the shower, use a plastic, non-slip stool. Keep the floor dry. Clean up any water that spills on the floor as soon as it happens. Remove soap buildup in the tub or shower regularly. Attach bath mats securely with double-sided non-slip rug tape. Do not have throw rugs and other things on the floor that can make you trip. What can I do in the bedroom? Use night lights. Make sure that you have a light by your bed that is easy to reach. Do not use  any sheets or blankets that are too big for your bed. They should not hang down onto the floor. Have a firm chair that has side arms. You can use this for support while you get dressed. Do not have throw rugs and other things on the floor that can make you trip. What can I do in the kitchen? Clean up any spills right away. Avoid walking on wet floors. Keep items that you use a lot in easy-to-reach places. If you need to reach something above you, use a strong step stool that has a grab bar. Keep electrical cords out of the way. Do not use floor polish or wax that makes floors slippery. If you must use wax, use non-skid floor wax. Do not have throw rugs and other things on the floor that can make you trip. What can I do with my stairs? Do not leave any items on the stairs. Make sure that there are handrails on both sides of the stairs and use them. Fix handrails that are broken or loose. Make sure that handrails are as long as the stairways. Check any carpeting to make sure that it is firmly attached to the stairs. Fix any carpet that is loose or worn. Avoid having throw rugs at the top or bottom of the stairs. If you do have throw rugs, attach them to the floor with carpet  tape. Make sure that you have a light switch at the top of the stairs and the bottom of the stairs. If you do not have them, ask someone to add them for you. What else can I do to help prevent falls? Wear shoes that: Do not have high heels. Have rubber bottoms. Are comfortable and fit you well. Are closed at the toe. Do not wear sandals. If you use a stepladder: Make sure that it is fully opened. Do not climb a closed stepladder. Make sure that both sides of the stepladder are locked into place. Ask someone to hold it for you, if possible. Clearly mark and make sure that you can see: Any grab bars or handrails. First and last steps. Where the edge of each step is. Use tools that help you move around (mobility aids)  if they are needed. These include: Canes. Walkers. Scooters. Crutches. Turn on the lights when you go into a dark area. Replace any light bulbs as soon as they burn out. Set up your furniture so you have a clear path. Avoid moving your furniture around. If any of your floors are uneven, fix them. If there are any pets around you, be aware of where they are. Review your medicines with your doctor. Some medicines can make you feel dizzy. This can increase your chance of falling. Ask your doctor what other things that you can do to help prevent falls. This information is not intended to replace advice given to you by your health care provider. Make sure you discuss any questions you have with your health care provider. Document Released: 06/21/2009 Document Revised: 01/31/2016 Document Reviewed: 09/29/2014 Elsevier Interactive Patient Education  2017 [employer].

## 2021-06-20 ENCOUNTER — Other Ambulatory Visit: Payer: Self-pay

## 2021-06-20 ENCOUNTER — Ambulatory Visit (INDEPENDENT_AMBULATORY_CARE_PROVIDER_SITE_OTHER): Payer: Medicare HMO

## 2021-06-20 DIAGNOSIS — Z23 Encounter for immunization: Secondary | ICD-10-CM

## 2021-07-22 ENCOUNTER — Encounter: Payer: Self-pay | Admitting: Family Medicine

## 2021-07-22 ENCOUNTER — Other Ambulatory Visit: Payer: Self-pay

## 2021-07-22 ENCOUNTER — Ambulatory Visit (INDEPENDENT_AMBULATORY_CARE_PROVIDER_SITE_OTHER): Payer: Medicare HMO | Admitting: Family Medicine

## 2021-07-22 VITALS — BP 98/64 | HR 62 | Temp 98.6°F | Ht 70.0 in | Wt 230.4 lb

## 2021-07-22 DIAGNOSIS — Z0000 Encounter for general adult medical examination without abnormal findings: Secondary | ICD-10-CM | POA: Diagnosis not present

## 2021-07-22 DIAGNOSIS — R7303 Prediabetes: Secondary | ICD-10-CM | POA: Diagnosis not present

## 2021-07-22 DIAGNOSIS — E7800 Pure hypercholesterolemia, unspecified: Secondary | ICD-10-CM

## 2021-07-22 DIAGNOSIS — M791 Myalgia, unspecified site: Secondary | ICD-10-CM | POA: Diagnosis not present

## 2021-07-22 DIAGNOSIS — Z23 Encounter for immunization: Secondary | ICD-10-CM

## 2021-07-22 DIAGNOSIS — T466X5A Adverse effect of antihyperlipidemic and antiarteriosclerotic drugs, initial encounter: Secondary | ICD-10-CM

## 2021-07-22 DIAGNOSIS — I2510 Atherosclerotic heart disease of native coronary artery without angina pectoris: Secondary | ICD-10-CM | POA: Diagnosis not present

## 2021-07-22 LAB — LIPID PANEL
Cholesterol: 129 mg/dL (ref 0–200)
HDL: 43 mg/dL (ref >39.00)
LDL Cholesterol: 63 mg/dL (ref 0–99)
NonHDL: 86.39
Total CHOL/HDL Ratio: 3
Triglycerides: 119 mg/dL (ref 0.0–149.0)
VLDL: 23.8 mg/dL (ref 0.0–40.0)

## 2021-07-22 LAB — CBC WITH DIFFERENTIAL/PLATELET
Basophils Absolute: 0 10*3/uL (ref 0.0–0.1)
Basophils Relative: 0.7 % (ref 0.0–3.0)
Eosinophils Absolute: 0.2 10*3/uL (ref 0.0–0.7)
Eosinophils Relative: 3.1 % (ref 0.0–5.0)
HCT: 45.1 % (ref 39.0–52.0)
Hemoglobin: 15.2 g/dL (ref 13.0–17.0)
Lymphocytes Relative: 29.7 % (ref 12.0–46.0)
Lymphs Abs: 1.9 10*3/uL (ref 0.7–4.0)
MCHC: 33.7 g/dL (ref 30.0–36.0)
MCV: 91.9 fl (ref 78.0–100.0)
Monocytes Absolute: 0.5 10*3/uL (ref 0.1–1.0)
Monocytes Relative: 7.5 % (ref 3.0–12.0)
Neutro Abs: 3.8 10*3/uL (ref 1.4–7.7)
Neutrophils Relative %: 59 % (ref 43.0–77.0)
Platelets: 175 10*3/uL (ref 150.0–400.0)
RBC: 4.91 Mil/uL (ref 4.22–5.81)
RDW: 13.2 % (ref 11.5–15.5)
WBC: 6.5 10*3/uL (ref 4.0–10.5)

## 2021-07-22 LAB — COMPREHENSIVE METABOLIC PANEL
ALT: 32 U/L (ref 0–53)
AST: 21 U/L (ref 0–37)
Albumin: 4.5 g/dL (ref 3.5–5.2)
Alkaline Phosphatase: 86 U/L (ref 39–117)
BUN: 17 mg/dL (ref 6–23)
CO2: 24 mEq/L (ref 19–32)
Calcium: 9.4 mg/dL (ref 8.4–10.5)
Chloride: 107 mEq/L (ref 96–112)
Creatinine, Ser: 0.89 mg/dL (ref 0.40–1.50)
GFR: 88.26 mL/min (ref >60.00)
Glucose, Bld: 96 mg/dL (ref 70–99)
Potassium: 4.5 mEq/L (ref 3.5–5.1)
Sodium: 139 mEq/L (ref 135–145)
Total Bilirubin: 0.6 mg/dL (ref 0.2–1.2)
Total Protein: 6.8 g/dL (ref 6.0–8.3)

## 2021-07-22 LAB — CK: Total CK: 159 U/L (ref 7–232)

## 2021-07-22 LAB — HEMOGLOBIN A1C: Hgb A1c MFr Bld: 6.4 % (ref 4.6–6.5)

## 2021-07-22 MED ORDER — TETANUS-DIPHTH-ACELL PERTUSSIS 5-2-15.5 LF-MCG/0.5 IM SUSP: 0.5000 mL | Freq: Once | INTRAMUSCULAR | 0 refills | Status: AC

## 2021-07-22 NOTE — Progress Notes (Addendum)
Office Note 07/22/2021   CC:     Chief Complaint  Patient presents with   Annual Exam      HPI:  Patient is a 70 y.o. male who is here for annual health maintenance exam and f/u CAD, HLD, prediabetes. He has a PMH sx for STEMI and CABG (12/2018), with preserved LV fxn. Patient complains about diffuse weakness and muscle pain associated with starting Atorvastatin. Feels the symptoms have been increasing lately. Patient also mentioned he experiences stiffness in his left shoulder without pinpoint tenderness. His range of motion is normal except for decreased range of motion at the very end of left arm abduction. Patient has not tried anything to relieve this symptom. ROS negative (cough, congestion, polyuria, polydipsia, excessive thirst).   INTERIM HX: Feeling well.  On 03/07/21 he was treated for Lyme dz (erythema migrans rash) by [facility]-->doxy x 21d (rash was his only symptom).  Diet and exercise improving.       Past Medical History:  Diagnosis Date   BPH (benign prostatic hypertrophy)      Flomax not helpful 2018/19 per pt report   Chronic pain of right knee      Left knee as well (osteoarthritis).  Dr. Durward Fortes   Colon polyps 2013    NON-adenomatous 2013---recall 10 yrs.   Elevated PSA 07/10/2016    Dr. Gaynelle Arabian saw him 07/15/16, did f/u PSA and it was 2.78: f/u was recommended but pt did not do this as of 07/2017.  Repeat PSA here 07/2017 was 3.1, with 19% free (free a little low).  Pt then followed up with Dr. Gaynelle Arabian and tx for BPH w/plan of repeat PSA 1 yr.  PSA 07/2018 down to 3.21.   Erectile dysfunction      Urol rx'd sildenafil 20mg  tabs 11/2017.   Fatigue      + excessive daytime somnolence   GERD (gastroesophageal reflux disease)     Hallux limitus 04/2016    1st MPJ joint R foot.  Diclofenac helpful.  Injected by podiatrist 11/2016.   Hyperlipidemia     Hypogonadism male     Left main coronary artery disease 12/07/2018    NSTEMI:  ost LM  ulcerated 75%, pCx 90%, EF 35-45% --> CABG x2 (LIMA-LAD, SVG-Cx) 12/14/2018.  LV function normal on Intra-Op TEE   NSTEMI (non-ST elevated myocardial infarction) ([place]) 12/2018    Critical left main and proximal circumflex disease--CABG x2 (Dr. Servando Snare.  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 [name] said he needs another sleep study as of 2013   [place]       TEE WITHOUT CARDIOVERSION N/A 12/08/2018    Procedure: TRANSESOPHAGEAL ECHOCARDIOGRAM (TEE);  Surgeon: Grace Isaac, MD;  Location: [facility];  Service: Open Heart Surgery;  Laterality: N/A;   TONSILLECTOMY AND ADENOIDECTOMY       TRANSTHORACIC ECHOCARDIOGRAM   12/07/2018    In setting of non-STEMI: EF 55-60%.  GR 1 DD.  Mild inferior-inferolateral and apical lateral HK.  Normal RV.  Normal valves   UPPER GI ENDOSCOPY   2000 & 2011    dilation 2000           Family History  Problem Relation Age of Onset   Cancer Mother          Gynecologic   COPD Mother     Heart attack Father 58         CHF    Heart attack Maternal Grandfather          > 52   Heart attack Paternal Grandfather          >55   Colon cancer Neg Hx     Stomach cancer Neg Hx     Diabetes Neg Hx     Stroke Neg Hx        Social History         Socioeconomic History   Marital status: Married      Spouse name: Not on file   Number of children: 1   Years of education: Not on file   Highest education level: Not on file  Occupational History   Not on file  Tobacco Use   Smoking status: Never   Smokeless tobacco: Never  Vaping Use   Vaping Use: Never used  Substance and Sexual Activity   Alcohol use: Yes      Alcohol/week: 1.0 standard drink      Types: 1 Cans of beer per week      Comment:  2 beers/ month   Drug use: No   Sexual activity: Yes  Other Topics Concern   Not on file  Social History Narrative    Married, 1 son.    Lives in [place].    Product/process development scientist -self-employed    No tob, no alc, no drugs.         Social Determinants of Health       Financial Resource Strain: Low Risk    Difficulty of Paying Living Expenses: Not hard at all  Food Insecurity: No Food Insecurity   Worried About Charity fundraiser in the Last Year: Never true   [place] in the Last Year: Never true  Transportation Needs: No Transportation Needs   Lack of Transportation (Medical): No   Lack of Transportation (Non-Medical): No  Physical Activity: Insufficiently Active   Days of Exercise per Week: 3 days   Minutes of Exercise per Session: 30 min  Stress: No Stress Concern Present   Feeling of Stress : Not at all  Social Connections: Moderately Integrated   Frequency of Communication with Friends and Family: More than three times a week   Frequency of Social Gatherings with Friends and Family: More than three times a week   Attends Religious Services: More than 4 times per year   Active Member of Clubs or Organizations: No  Attends Archivist Meetings: Never   Marital Status:  Married  Human resources officer Violence: Not At Risk   Fear of Current or Ex-Partner: No   Emotionally Abused: No   Physically Abused: No   Sexually Abused: No            Outpatient Medications Prior to Visit  Medication Sig Dispense Refill   aspirin EC 81 MG tablet Take 81 mg by mouth daily.       atorvastatin (LIPITOR) 80 MG tablet TAKE 1 TABLET BY MOUTH DAILY AT 6 PM. 90 tablet 2   metoprolol tartrate (LOPRESSOR) 25 MG tablet TAKE 1 TABLET BY MOUTH TWICE A DAY 180 tablet 1   Multiple Vitamins-Minerals (MULTIVITAMIN MEN PO) Take by mouth daily.       Multiple Vitamins-Minerals (ZINC PO) Take 30 mg by mouth daily.       omeprazole (PRILOSEC) 20 MG capsule Take 20 mg by mouth daily.        No facility-administered medications prior to visit.           Allergies  Allergen Reactions   Sulfonamide Derivatives        REACTION: rash Because of a history of documented adverse serious drug reaction;Medi Alert bracelet  is recommended      ROS as above, plus--> no fevers, no CP, no SOB, no wheezing, no cough, no dizziness, no HAs, no rashes, no melena/hematochezia.  No polyuria or polydipsia.  No myalgias or arthralgias.  No focal weakness, paresthesias, or tremors.  No acute vision or hearing abnormalities.  No dysuria or unusual/new urinary urgency or frequency.  No recent changes in lower legs. No n/v/d or abd pain.  No palpitations.     PE; Vitals with BMI 07/22/2021 05/30/2021 07/20/2020  Height 5\' 10"  (No Data) 5' 9.25"  Weight 230 lbs 6 oz (No Data) 233 lbs 10 oz  BMI 02.58 - 52.77  Systolic 98 (No Data) 824  Diastolic 64 (No Data) 66  Pulse 62 - 59     Physical Exam Constitutional:      Appearance: Normal appearance.  Cardiovascular:     Rate and Rhythm: Normal rate and regular rhythm.     Heart sounds: Normal heart sounds.  Pulmonary:     Effort: Pulmonary effort is normal.     Breath sounds: Normal breath sounds.  Abdominal:     General: Abdomen is flat. Bowel sounds  are normal.     Palpations: Abdomen is soft.  Neurological:     Mental Status: He is alert.      Pertinent labs:  Recent Labs       Lab Results  Component Value Date    TSH 2.41 07/09/2016      Recent Labs       Lab Results  Component Value Date    WBC 6.4 07/18/2019    HGB 14.6 07/18/2019    HCT 42.9 07/18/2019    MCV 92.7 07/18/2019    PLT 182.0 07/18/2019      Recent Labs       Lab Results  Component Value Date    CREATININE 0.94 07/20/2020    BUN 19 07/20/2020    NA 138 07/20/2020    K 4.3 07/20/2020    CL 106 07/20/2020    CO2 25 07/20/2020      Recent Labs       Lab Results  Component Value Date    ALT 30 07/20/2020    AST 22 07/20/2020  ALKPHOS 75 07/20/2020    BILITOT 0.5 07/20/2020      Recent Labs       Lab Results  Component Value Date    CHOL 116 07/20/2020      Recent Labs       Lab Results  Component Value Date    HDL 38.90 (L) 07/20/2020      Recent Labs       Lab Results  Component Value Date    LDLCALC 61 07/20/2020      Recent Labs       Lab Results  Component Value Date    TRIG 83.0 07/20/2020      Recent Labs       Lab Results  Component Value Date    CHOLHDL 3 07/20/2020      Recent Labs       Lab Results  Component Value Date    PSA 2.35 07/20/2020    PSA 2.13 07/18/2019    PSA 3.21 07/14/2018      Recent Labs       Lab Results  Component Value Date    HGBA1C 6.1 07/20/2020      ASSESSMENT AND PLAN:  Tyler [name] is a non-toxic appearing, stable male who presents today for a regular physical exam.   1.) CAD, hx of CABG 2020: asymptomatic. Continue ASA, BB, statin. Will follow cardiologist recommendations after patients appointment at the end of this month regarding statin prescription. Will order CK to rule out myositis.   2.) HLD: Well controlled based on last year panel, another one will be drawn today.  Forward today's results to Dr. Ellyn Hack.  3) Prediabetes.  Working on diet/exercise/wt  loss. Hba1c and fasting glucose today.  4) Health maintenance exam: Reviewed age and gender appropriate health maintenance issues (prudent diet, regular exercise, health risks of tobacco and excessive alcohol, use of seatbelts, fire alarms in home, use of sunscreen).  Also reviewed age and gender appropriate health screening as well as vaccine recommendations. Vaccines:  Flu->UTD.  Tdap->rx sent. Covid 19-UTD) Labs: cbc, cmet, flp, Hba1c. Prostate ca screening: hx elevated PSA-->PSA today. Colon ca screening: recall 2023.    An After Visit Summary was printed and given to the patient.   FOLLOW UP:  6 mo  Phil Dopp - MS3  I personally was present during the history, physical exam, and medical decision-making activities of this service and have verified that the service and findings are accurately documented in the student's note.  Signed:  Crissie Sickles, MD           07/22/2021

## 2021-07-22 NOTE — Progress Notes (Signed)
See student note for this encounter.  I have attested it. Signed:  Crissie Sickles, MD           07/22/2021

## 2021-07-22 NOTE — Patient Instructions (Signed)

## 2021-08-05 ENCOUNTER — Other Ambulatory Visit: Payer: Self-pay

## 2021-08-05 ENCOUNTER — Encounter: Payer: Self-pay | Admitting: Cardiology

## 2021-08-05 ENCOUNTER — Ambulatory Visit: Payer: Medicare HMO | Admitting: Cardiology

## 2021-08-05 VITALS — BP 122/70 | HR 61 | Ht 70.0 in | Wt 229.4 lb

## 2021-08-05 DIAGNOSIS — Z951 Presence of aortocoronary bypass graft: Secondary | ICD-10-CM

## 2021-08-05 DIAGNOSIS — R5382 Chronic fatigue, unspecified: Secondary | ICD-10-CM

## 2021-08-05 DIAGNOSIS — E785 Hyperlipidemia, unspecified: Secondary | ICD-10-CM | POA: Diagnosis not present

## 2021-08-05 DIAGNOSIS — I2510 Atherosclerotic heart disease of native coronary artery without angina pectoris: Secondary | ICD-10-CM

## 2021-08-05 DIAGNOSIS — I214 Non-ST elevation (NSTEMI) myocardial infarction: Secondary | ICD-10-CM

## 2021-08-05 NOTE — Patient Instructions (Addendum)
Medication Instructions:   Do a statin holiday  starting Jan 1  through Feb 1 , 2023   Do not take statin Medication ( Atorvastatin) to see if you have your symptoms  continue  if do not improve  -  restart  Atorvastatin . If symptoms get better contact office.    *If you need a refill on your cardiac medications before your next appointment, please call your pharmacy*   Lab Work:  In 6 month   Lipid- fasting  Hepatic panel  If you have labs (blood work) drawn today and your tests are completely normal, you will receive your results only by: [place] (if you have MyChart) OR A paper copy in the mail If you have any lab test that is abnormal or we need to change your treatment, we will call you to review the results.   Testing/Procedures: Not needed   Follow-Up: At [facility], you and your health needs are our priority.  As part of our continuing mission to provide you with exceptional heart care, we have created designated Provider Care Teams.  These Care Teams include your primary Cardiologist (physician) and Advanced Practice Providers (APPs -  Physician Assistants and Nurse Practitioners) who all work together to provide you with the care you need, when you need it.  We recommend signing up for the patient portal called "MyChart".  Sign up information is provided on this After Visit Summary.  MyChart is used to connect with patients for Virtual Visits (Telemedicine).  Patients are able to view lab/test results, encounter notes, upcoming appointments, etc.  Non-urgent messages can be sent to your provider as well.   To learn more about what you can do with MyChart, go to [email or web address].    Your next appointment:   6 month(s)  The format for your next appointment:   In Person  Provider:   Coletta Memos, FNP, Sande Rives, PA-C, Caron Presume, PA-C, Jory Sims, DNP, ANP, or Almyra Deforest, PA-C    Then, Glenetta Hew, MD will plan to see you again in 12  month(s).

## 2021-08-05 NOTE — Progress Notes (Signed)
Primary Care Provider: Tammi Sou, MD Cardiologist: Glenetta Hew, MD Electrophysiologist: None  Clinic Note:  No chief complaint on file.  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  No further angina since CABG.  Plan: Continue aspirin -okay to hold 5 to 7 days preop for surgeries or procedures. Continue statin for now, plan for statin holiday in January to reassess symptoms of fatigue. -continue metoprolol 25 mg bid, consider once daily dosage if symptoms persist with temporary changes to statin, may consider transitioning to long-acting metoprolol       Relevant Orders   EKG 12-Lead (Completed)   TSH   Hyperlipidemia with target LDL less than 70 (Chronic)    -most recent cholesterol 129, HDL 43 and LDL 63. -continue atorvastatin 80 mg -> but with complaint of fatigue, will allow for 1 month statin holiday to reassess.       Relevant Orders   Hepatic function panel   Lipid panel   TSH   NSTEMI (non-ST elevated myocardial infarction) ([place]) - Primary (Chronic)    Presented with non-STEMI.  Preserved EF with no R WMA on Echo.  No recurrent angina or heart failure symptoms.  Remains on relatively low-dose beta-blocker along with aspirin and statin ->  overall doing well.      Relevant Orders   EKG 12-Lead (Completed)     Other   S/P CABG x 2 (Chronic)    Doing well no symptoms.  Would probably plan screening Myoview test in 2025.      Relevant Orders   Hepatic function panel   Lipid panel   Chronic fatigue (Chronic)    -given patient complaint of fatigue, will hold statin from January 2023 for 1 month, instructed to resume in Feb 2023 to see if symptoms improve  If symptoms not improved with statin holiday, may consider reducing beta-blocker plus or minus symptomatic long-acting  dosing.      Relevant Orders   TSH   Follow up with PA in 6 months as & Follow up with Dr. Ellyn Hack in 1 year  ===================================  HPI:    Tyler [name] is a 70 y.o. male with a PMH notable for LM-CAD (NSTEMI- CABG X 2 11/2018) with HTN & HLD who presents today for delayed Annual follow up visit.  Tyler [name] was last seen on 04/06/2020.  This was a 93-month visit-and he was doing Very well.  He has not he had gone back to his gym [employer]) because he did not trust the other patrons being conscious of COVID guidelines.  Still working out with exercising at home doing walking and other calisthenics.  Still doing some part-time work off and on.  Staying hospital.  Noted occasional strange tingling sensations in his chest like a tugging sensation, but not significan and not like his anginal chest pain. >  No changes made.  Plan for 6 to 51-month follow-up.  ->  Clearly that has not happened.  Recent Hospitalizations: None  Reviewed  CV studies:    The following studies were reviewed today: (if available, images/films reviewed: From Epic Chart or Care Everywhere) None   Interval History:   Tyler [name] is a 70 year old male with a past medical history of CAD s/p CABGx2 in 2020 and hyperlipidemia.  He presents for a follow up. Doing well overall. Only minor concern is that he feels sluggish and experiencing muscle aches since his CABG, he believes it may be either the statin or metoprolol that is causing these symptoms but he is still able to do his daily activities and stay active. He is active throughout the day, does a lot of yard work. He is open to continuing both medications. Denies any other concerns at this time.   CV Review of Symptoms (Summary) Cardiovascular ROS: negative for - chest pain, dyspnea on exertion, irregular heartbeat, murmur, palpitations, or shortness of breath  REVIEWED OF SYSTEMS   Review of Systems  Eyes:  Negative for  blurred vision.  Cardiovascular:  Negative for chest pain, palpitations and leg swelling.  Musculoskeletal:  Positive for joint pain and myalgias.  Neurological:  Negative for weakness and headaches.   I have reviewed and (if needed) personally updated the patient's problem list, medications, allergies, past medical and surgical history, social and family history.   PAST MEDICAL HISTORY   Past Medical History:  Diagnosis Date   BPH (benign prostatic hypertrophy)    Flomax not helpful 2018/19 per pt report   Chronic pain of right knee    Left knee as well (osteoarthritis).  Dr. Durward Fortes   Colon polyps 2013   NON-adenomatous 2013---recall 10 yrs.   Elevated PSA 07/10/2016   Dr. Gaynelle Arabian saw him 07/15/16, did f/u PSA and it was 2.78: f/u was recommended but pt did not do this as of 07/2017.  Repeat PSA here 07/2017 was 3.1, with 19% free (free a little low).  Pt then followed up with Dr. Gaynelle Arabian and tx for BPH w/plan of repeat PSA 1 yr.  PSA 07/2018 down to 3.21.   Erectile dysfunction    Urol rx'd sildenafil 20mg  tabs 11/2017.   Fatigue    + excessive daytime somnolence   GERD (gastroesophageal reflux disease)    Hallux limitus 04/2016   1st MPJ joint R foot.  Diclofenac helpful.  Injected by podiatrist 11/2016.   Hyperlipidemia    Hypogonadism male    Left main coronary artery disease 12/07/2018   NSTEMI:  ost LM ulcerated 75%, pCx 90%, EF 35-45% --> CABG x2 (LIMA-LAD, SVG-Cx) 12/14/2018.  LV function normal on Intra-Op TEE   NSTEMI (non-ST elevated myocardial infarction) ([place]) 12/2018   Critical left main and proximal circumflex disease--CABG x2 (Dr. Servando Snare.  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 A1c 6.4% 07/2021    PAST SURGICAL HISTORY   Past Surgical History:  Procedure Laterality Date   COLONOSCOPY  1998 & 2013    Dr [name]; 2  benign polyps 2013--recall 2023.   CORONARY ARTERY BYPASS GRAFT N/A 12/08/2018   Procedure: CORONARY ARTERY BYPASS GRAFTING (CABG) x 2, SVG TO  OM1, LIMA TO LAD, USING LEFT INTERNAL MAMMARY ARTERY AND RIGHT GREATER SAPHENOUS VEIN HARVESTED ENDOSCOPICALLY;  Surgeon: Grace Isaac, MD;  Location: [facility];  Service: Open Heart Surgery;  Laterality: N/A;   hydrocoelectomy     LEFT HEART CATH AND CORONARY ANGIOGRAPHY N/A 12/07/2018   Procedure: LEFT HEART CATH AND CORONARY ANGIOGRAPHY;  Surgeon: Jettie Booze, MD;  Location: [facility];; Ost LM 75%-ulcerated, pCx 90%, EF 35-45% --> referred for CABG   nocturnal polysomn  1990s   Sleep lab: no OSA (??)--Dr.  Clance said he needs another sleep study as of 2013   [place]     TEE WITHOUT CARDIOVERSION N/A 12/08/2018   Procedure: TRANSESOPHAGEAL ECHOCARDIOGRAM (TEE);  Surgeon: Grace Isaac, MD;  Location: [facility];  Service: Open Heart Surgery;  Laterality: N/A;   TONSILLECTOMY AND ADENOIDECTOMY     TRANSTHORACIC ECHOCARDIOGRAM  12/07/2018   In setting of non-STEMI: EF 55-60%.  GR 1 DD.  Mild inferior-inferolateral and apical lateral HK.  Normal RV.  Normal valves   UPPER GI ENDOSCOPY  2000 & 2011   dilation 2000    Immunization History  Administered Date(s) Administered   Fluad Quad(high Dose 65+) 06/03/2019, 07/20/2020, 06/20/2021   Influenza Split 07/09/2011, 06/14/2012   Influenza Whole 07/09/2007, 07/05/2010   Influenza, High Dose Seasonal PF 06/22/2018   Influenza,inj,Quad PF,6+ Mos 06/14/2013, 06/19/2014, 06/20/2015, 07/04/2016, 06/25/2017   PFIZER(Purple Top)SARS-COV-2 Vaccination 09/28/2019, 10/19/2019, 06/16/2020   Pneumococcal Conjugate-13 07/18/2019   Pneumococcal Polysaccharide-23 07/14/2018   Td 10/18/2010   Zoster Recombinat (Shingrix) 09/13/2018, 03/08/2019   Zoster, Live 06/22/2013    MEDICATIONS/ALLERGIES   Current Meds  Medication Sig   aspirin EC 81 MG tablet Take 81 mg by mouth daily.    atorvastatin (LIPITOR) 80 MG tablet TAKE 1 TABLET BY MOUTH DAILY AT 6 PM.   metoprolol tartrate (LOPRESSOR) 25 MG tablet TAKE 1 TABLET BY MOUTH TWICE A DAY   Multiple Vitamins-Minerals (MULTIVITAMIN MEN PO) Take by mouth daily.   Multiple Vitamins-Minerals (ZINC PO) Take 30 mg by mouth daily.   omeprazole (PRILOSEC) 20 MG capsule Take 20 mg by mouth daily.    Allergies  Allergen Reactions   Sulfonamide Derivatives     REACTION: rash Because of a history of documented adverse serious drug reaction;Medi Alert bracelet  is recommended    SOCIAL HISTORY/FAMILY HISTORY   Reviewed in Epic:  Pertinent findings:  Social History   Tobacco Use   Smoking status: Never   Smokeless tobacco: Never  Vaping Use   Vaping Use: Never used  Substance Use Topics   Alcohol use: Yes    Alcohol/week: 1.0 standard drink    Types: 1 Cans of beer per week    Comment:  2 beers/ month   Drug use: No   Social History   Social History Narrative   Married, 1 son.   Lives in [place].   Product/process development scientist -self-employed   No tob, no alc, no drugs.       OBJCTIVE -PE, EKG, labs   Wt Readings from Last 3 Encounters:  08/05/21 229 lb 6.4 oz (104.1 kg)  07/22/21 230 lb 6.4 oz (104.5 kg)  07/20/20 233 lb 9.6 oz (106 kg)    Physical Exam: BP 122/70   Pulse 61   Ht 5\' 10"  (1.778 m)   Wt 229 lb 6.4 oz (104.1 kg)   SpO2 96%   BMI 32.92 kg/m  Physical Exam Vitals reviewed.  Constitutional:      General: He is not in acute distress.    Appearance: Normal appearance. He is not ill-appearing or toxic-appearing.  HENT:     Head: Normocephalic and atraumatic.  Cardiovascular:     Rate and Rhythm: Normal rate and regular rhythm.     Pulses: Normal pulses.     Heart sounds: Normal heart sounds. No murmur heard.   No friction rub. No gallop.  Pulmonary:     Effort: Pulmonary effort is normal. No respiratory distress.     Breath sounds: Normal breath sounds.  No stridor. No rhonchi.   Musculoskeletal:        General: No swelling.     Right lower leg: No edema.     Left lower leg: No edema.  Skin:    General: Skin is warm and dry.     Capillary Refill: Capillary refill takes less than 2 seconds.  Neurological:     Mental Status: He is alert.     Adult ECG Report  Rate: 61 ;  Rhythm: normal sinus rhythm;   Narrative Interpretation: Normal sinus rhythm  -- personally reviewed & signed.  Concur.   Recent Labs:   reviewed  Lab Results  Component Value Date   CHOL 129 07/22/2021   HDL 43.00 07/22/2021   LDLCALC 63 07/22/2021   LDLDIRECT 116.0 07/14/2018   TRIG 119.0 07/22/2021   CHOLHDL 3 07/22/2021   Lab Results  Component Value Date   CREATININE 0.89 07/22/2021   BUN 17 07/22/2021   NA 139 07/22/2021   K 4.5 07/22/2021   CL 107 07/22/2021   CO2 24 07/22/2021   CBC Latest Ref Rng & Units 07/22/2021 07/18/2019 03/31/2019  WBC 4.0 - 10.5 K/uL 6.5 6.4 4.5  Hemoglobin 13.0 - 17.0 g/dL 15.2 14.6 14.5  Hematocrit 39.0 - 52.0 % 45.1 42.9 43.4  Platelets 150.0 - 400.0 K/uL 175.0 182.0 181    Lab Results  Component Value Date   HGBA1C 6.4 07/22/2021   Lab Results  Component Value Date   TSH 2.41 07/09/2016    ==================================================  COVID-19 Education: The signs and symptoms of COVID-19 were discussed with the patient and how to seek care for testing (follow up with PCP or arrange E-visit).    I spent a total of 20 minutes with the patient spent in direct patient consultation. Additional 4 min with Attending.  Additional time spent with chart review  / charting (studies, outside notes, etc): 7 min - 4 min Attending attestation.  Total Time: 35 min  Current medicines are reviewed at length with the patient today.  (+/- concerns) none  This visit occurred during the SARS-CoV-2 public health emergency.  Safety protocols were in place, including screening questions prior to the visit, additional usage of staff PPE, and  extensive cleaning of exam room while observing appropriate contact time as indicated for disinfecting solutions.  Notice: This dictation was prepared with Dragon dictation along with smart phrase technology. Any transcriptional errors that result from this process are unintentional and may not be corrected upon review.  Studies Ordered:   Orders Placed This Encounter  Procedures   Hepatic function panel   Lipid panel   TSH   EKG 12-Lead     Patient Instructions / Medication Changes & Studies & Tests Ordered   Patient Instructions  Medication Instructions:   Do a statin holiday  starting Jan 1  through Feb 1 , 2023   Do not take statin Medication ( Atorvastatin) to see if you have your symptoms  continue  if do not improve  -  restart  Atorvastatin . If symptoms get better contact office.    *If you need a refill on your cardiac medications before your next appointment, please call your pharmacy*   Lab Work:  In 6 month   Lipid- fasting  Hepatic panel  If you have labs (blood work) drawn today and your tests are completely normal, you will receive your results only by: [place] (if you have MyChart) OR A paper copy in the mail If  you have any lab test that is abnormal or we need to change your treatment, we will call you to review the results.   Testing/Procedures: Not needed   Follow-Up: At [facility], you and your health needs are our priority.  As part of our continuing mission to provide you with exceptional heart care, we have created designated Provider Care Teams.  These Care Teams include your primary Cardiologist (physician) and Advanced Practice Providers (APPs -  Physician Assistants and Nurse Practitioners) who all work together to provide you with the care you need, when you need it.  We recommend signing up for the patient portal called "MyChart".  Sign up information is provided on this After Visit Summary.  MyChart is used to connect with patients  for Virtual Visits (Telemedicine).  Patients are able to view lab/test results, encounter notes, upcoming appointments, etc.  Non-urgent messages can be sent to your provider as well.   To learn more about what you can do with MyChart, go to [email or web address].    Your next appointment:   6 month(s)  The format for your next appointment:   In Person  Provider:   Coletta Memos, FNP, Sande Rives, PA-C, Caron Presume, PA-C, Jory Sims, DNP, ANP, or Almyra Deforest, PA-C    Then, Glenetta Hew, MD will plan to see you again in 12 month(s).        Signed,   Donney Dice, DO  R[facility].       ATTENDING ATTESTATION  I have seen, examined and evaluated the patient along with the Resident Physician in clinic today.  I personally performed my own interview & exanimation.  After reviewing all the available data and chart, we discussed the patients laboratory, study & physical findings as well as symptoms in detail. I agree with her findings, examination as well as impression recommendations as per our discussion.    Attending adjustments int the full clinic noted annotated in [place].     Glenetta Hew, M.D., M.S. Interventional Cardiologist   Pager # [phone] Phone # [phone] [address], [place] [address]    Glenetta Hew, M.D., M.S. Interventional Cardiologist   Pager # [phone] Phone # [phone] [address] [place], [place] [address]    Thank you for choosing Heartcare at [facility]!!

## 2021-08-28 ENCOUNTER — Encounter: Payer: Self-pay | Admitting: Cardiology

## 2021-08-28 NOTE — Assessment & Plan Note (Signed)
-  most recent cholesterol 129, HDL 43 and LDL 63. -continue atorvastatin 80 mg -> but with complaint of fatigue, will allow for 1 month statin holiday to reassess.

## 2021-08-28 NOTE — Assessment & Plan Note (Signed)
Significant LM disease on Cath referred for CABG x2.  No further angina since CABG.  Plan:  Continue aspirin -okay to hold 5 to 7 days preop for surgeries or procedures.  Continue statin for now, plan for statin holiday in January to reassess symptoms of fatigue.  -continue metoprolol 25 mg bid, consider once daily dosage if symptoms persist with temporary changes to statin, may consider transitioning to long-acting metoprolol

## 2021-08-28 NOTE — Assessment & Plan Note (Signed)
-  given patient complaint of fatigue, will hold statin from January 2023 for 1 month, instructed to resume in Feb 2023 to see if symptoms improve  If symptoms not improved with statin holiday, may consider reducing beta-blocker plus or minus symptomatic long-acting dosing.

## 2021-08-28 NOTE — Progress Notes (Signed)
° ° ° ° °  ATTENDING ATTESTATION  I have seen, examined and evaluated the patient along with the Resident Physician Donney Dice, DO) in clinic today.  I personally performed my own interview & exanimation.  After reviewing all the available data and chart, we discussed the patients laboratory, study & physical findings as well as symptoms in detail. I agree with her findings, examination as well as impression recommendations as per our discussion.    Attending adjustments in the full clinic noted annotated in [place].    ASSESSMENT & PLAN Hyperlipidemia with target LDL less than 70 -most recent cholesterol 129, HDL 43 and LDL 63. -continue atorvastatin 80 mg -> but with complaint of fatigue, will allow for 1 month statin holiday to reassess.   Chronic fatigue -given patient complaint of fatigue, will hold statin from January 2023 for 1 month, instructed to resume in Feb 2023 to see if symptoms improve  If symptoms not improved with statin holiday, may consider reducing beta-blocker plus or minus symptomatic long-acting dosing.  Left Main CAD - s/p CABG x 2 Significant LM disease on Cath referred for CABG x2.  No further angina since CABG.  Plan: Continue aspirin -okay to hold 5 to 7 days preop for surgeries or procedures. Continue statin for now, plan for statin holiday in January to reassess symptoms of fatigue. -continue metoprolol 25 mg bid, consider once daily dosage if symptoms persist with temporary changes to statin, may consider transitioning to long-acting metoprolol   NSTEMI (non-ST elevated myocardial infarction) [facility]) Presented with non-STEMI.  Preserved EF with no R WMA on Echo.  No recurrent angina or heart failure symptoms.  Remains on relatively low-dose beta-blocker along with aspirin and statin ->  overall doing well.  S/P CABG x 2 Doing well no symptoms.  Would probably plan screening Myoview test in 2025.    Glenetta Hew, M.D., M.S. Interventional  Cardiologist   Pager # [phone] Phone # [phone] [address] [place], [place] [address]

## 2021-08-28 NOTE — Assessment & Plan Note (Signed)
Doing well no symptoms.  Would probably plan screening Myoview test in 2025.

## 2021-08-28 NOTE — Assessment & Plan Note (Signed)
Presented with non-STEMI.  Preserved EF with no R WMA on Echo.  No recurrent angina or heart failure symptoms.  Remains on relatively low-dose beta-blocker along with aspirin and statin ->  overall doing well.

## 2021-10-11 ENCOUNTER — Telehealth: Payer: Self-pay | Admitting: Cardiology

## 2021-10-11 NOTE — Telephone Encounter (Signed)
Pt c/o medication issue:  1. Name of Medication: atorvastatin (LIPITOR) 80 MG tablet  2. How are you currently taking this medication (dosage and times per day)? Not currently taking  3. Are you having a reaction (difficulty breathing--STAT)? no  4. What is your medication issue? Patient states he went off the medication for a month and feels much better. He states he thinks it is too strong for him and would like to know if he can instead be put on crestor or pravastatin.

## 2021-10-11 NOTE — Telephone Encounter (Signed)
-  Pt state he stopped atorvastatin on 1/2 as instructed by MD. - Pt report after 2 1/2 weeks, his symptoms completely resolved and felt much better. - Pt state it's been 30 days and symptoms has not reoccurred. -Pt questioning if MD would like for him to remain off atorvastatin. If so, does he recommend anything additional.  Will forward to Dr. Ellyn Hack

## 2021-10-13 NOTE — Telephone Encounter (Signed)
Plan would be to replace atorvastatin with rosuvastatin-start 20 mg p.o. daily.  New Rx rosuvastatin 20 mg p.o. daily; Disp 30 tabs, 3 refills.  Glenetta Hew, MD

## 2021-10-14 MED ORDER — ROSUVASTATIN CALCIUM 20 MG PO TABS: 20.0000 mg | ORAL_TABLET | Freq: Every day | ORAL | 3 refills | Status: DC

## 2021-10-14 NOTE — Telephone Encounter (Signed)
-  Pt updated with MD's recommendations -Pt verbalized understanding -Medication list updated and new rx sent for rosuvastatin 20 mg

## 2021-10-25 ENCOUNTER — Encounter: Payer: Self-pay | Admitting: Cardiology

## 2021-10-25 NOTE — Telephone Encounter (Signed)
Spoke with patient who reports he home-tested positive for COVID on 2/7. He is feeling better, but still tests positive. He had CABG 3 years ago and wants to if there is any medication he should be taking. I recommended he contact his PCP and that I would forward this note to Dr. Ellyn Hack.

## 2021-10-28 NOTE — Telephone Encounter (Signed)
Agree with talking to PCP.

## 2021-10-29 ENCOUNTER — Other Ambulatory Visit: Payer: Self-pay | Admitting: Cardiology

## 2021-10-29 NOTE — Telephone Encounter (Signed)
Spoke to patient .  Information  given to patient.  Patient states he tested yesterday and it came back negative.  Patient is aware to contact PCP IF SYMPTOMS  return or become worse.

## 2021-12-28 ENCOUNTER — Other Ambulatory Visit: Payer: Self-pay | Admitting: Cardiology

## 2022-01-07 DIAGNOSIS — H524 Presbyopia: Secondary | ICD-10-CM | POA: Diagnosis not present

## 2022-01-22 DIAGNOSIS — R3912 Poor urinary stream: Secondary | ICD-10-CM | POA: Diagnosis not present

## 2022-01-22 DIAGNOSIS — R351 Nocturia: Secondary | ICD-10-CM | POA: Diagnosis not present

## 2022-01-22 DIAGNOSIS — Z125 Encounter for screening for malignant neoplasm of prostate: Secondary | ICD-10-CM | POA: Diagnosis not present

## 2022-01-22 DIAGNOSIS — N401 Enlarged prostate with lower urinary tract symptoms: Secondary | ICD-10-CM | POA: Diagnosis not present

## 2022-01-22 DIAGNOSIS — R350 Frequency of micturition: Secondary | ICD-10-CM | POA: Diagnosis not present

## 2022-01-30 DIAGNOSIS — I2510 Atherosclerotic heart disease of native coronary artery without angina pectoris: Secondary | ICD-10-CM | POA: Diagnosis not present

## 2022-01-30 DIAGNOSIS — Z951 Presence of aortocoronary bypass graft: Secondary | ICD-10-CM | POA: Diagnosis not present

## 2022-01-30 DIAGNOSIS — E785 Hyperlipidemia, unspecified: Secondary | ICD-10-CM | POA: Diagnosis not present

## 2022-01-30 DIAGNOSIS — R5382 Chronic fatigue, unspecified: Secondary | ICD-10-CM | POA: Diagnosis not present

## 2022-01-30 LAB — HEPATIC FUNCTION PANEL
ALT: 19 IU/L (ref 0–44)
AST: 19 IU/L (ref 0–40)
Albumin: 4.4 g/dL (ref 3.8–4.8)
Alkaline Phosphatase: 74 IU/L (ref 44–121)
Bilirubin Total: 0.3 mg/dL (ref 0.0–1.2)
Bilirubin, Direct: 0.12 mg/dL (ref 0.00–0.40)
Total Protein: 6.5 g/dL (ref 6.0–8.5)

## 2022-01-30 LAB — LIPID PANEL
Chol/HDL Ratio: 3 ratio (ref 0.0–5.0)
Cholesterol, Total: 139 mg/dL (ref 100–199)
HDL: 46 mg/dL (ref >39)
LDL Chol Calc (NIH): 71 mg/dL (ref 0–99)
Triglycerides: 120 mg/dL (ref 0–149)
VLDL Cholesterol Cal: 22 mg/dL (ref 5–40)

## 2022-01-30 LAB — TSH: TSH: 1.31 u[IU]/mL (ref 0.450–4.500)

## 2022-02-03 NOTE — Progress Notes (Unsigned)
Cardiology Office Note:    Date:  02/05/2022   ID:  Tyler [name], DOB 1951/06/21, MRN [id number]  PCP:  Tammi Sou, MD   [facility] Providers Cardiologist:  Glenetta Hew, MD Cardiology APP:  Ledora Bottcher, PA { Referring MD: Tammi Sou, MD   Chief Complaint  Patient presents with   Follow-up    CAD s/p CABG    History of Present Illness:    Tyler [name] is a 71 y.o. male with a hx of left main CAD s/p CABG x2 in 2020, hyperlipidemia and fatigue.  He was last seen by Dr. Ellyn Hack 08/05/2021 and reported fatigue and feeling sluggish along with muscle aches since his CABG.  He thought this was due to either the statin or metoprolol.  He has stayed active with exercising at home. Dr. Ellyn Hack suggested a 1 month statin holiday to evaluate January 2023 with resumption of statin in February 2023.  If symptoms continued without relief during statin holiday, consider switching metoprolol to tartrate to succinate.  He returns today for 61-monthfollow-up.  He completed statin holiday in January with significant improvement in symptoms. He was switched to crestor 20 mg and is doing well on this. He is walking about 2 miles 5 days per week without angina. He asks about Qunol. He had joint pain prior to CABG.    Past Medical History:  Diagnosis Date   BPH (benign prostatic hypertrophy)    Flomax not helpful 2018/19 per pt report   Chronic pain of right knee    Left knee as well (osteoarthritis).  Dr. WDurward Fortes  Colon polyps 2013   NON-adenomatous 2013---recall 10 yrs.   Elevated PSA 07/10/2016   Dr. TGaynelle Arabiansaw him 07/15/16, did f/u PSA and it was 2.78: f/u was recommended but pt did not do this as of 07/2017.  Repeat PSA here 07/2017 was 3.1, with 19% free (free a little low).  Pt then followed up with Dr. TGaynelle Arabianand tx for BPH w/plan of repeat PSA 1 yr.  PSA 07/2018 down to 3.21.   Erectile dysfunction    Urol rx'd sildenafil '20mg'$  tabs 11/2017.    Fatigue    + excessive daytime somnolence   GERD (gastroesophageal reflux disease)    Hallux limitus 04/2016   1st MPJ joint R foot.  Diclofenac helpful.  Injected by podiatrist 11/2016.   Hyperlipidemia    Hypogonadism male    Left main coronary artery disease 12/07/2018   NSTEMI:  ost LM ulcerated 75%, pCx 90%, EF 35-45% --> CABG x2 (LIMA-LAD, SVG-Cx) 12/14/2018.  LV function normal on Intra-Op TEE   NSTEMI (non-ST elevated myocardial infarction) ([facility] 12/2018   Critical left main and proximal circumflex disease--CABG x2 (Dr. GServando Snare  LIMA-LAD, SVG-LCx)   Postoperative atrial fibrillation [place] 12/[place]   Converted on amiodarone; amio likely to be only short term   Prediabetes    A1c 5.8% 2014  A1c 6.1% Nov 2021. A1c 6.4% 07/2021    Past Surgical History:  Procedure Laterality Date   COLONOSCOPY  1998 & 2013    Dr PHenrene Pastor 2 benign polyps 2013--recall 2023.   CORONARY ARTERY BYPASS GRAFT N/A 12/08/2018   Procedure: CORONARY ARTERY BYPASS GRAFTING (CABG) x 2, SVG TO  OM1, LIMA TO LAD, USING LEFT INTERNAL MAMMARY ARTERY AND RIGHT GREATER SAPHENOUS VEIN HARVESTED ENDOSCOPICALLY;  Surgeon: GGrace Isaac MD;  [place]  Service: Open Heart Surgery;  Laterality: N/A;   hydrocoelectomy     LEFT  HEART CATH AND CORONARY ANGIOGRAPHY N/A 12/07/2018   Procedure: LEFT HEART CATH AND CORONARY ANGIOGRAPHY;  Surgeon: Jettie Booze, MD;  Location: [facility];; Ost LM 75%-ulcerated, pCx 90%, EF 35-45% --> referred for CABG   nocturnal polysomn  1990s   Sleep lab: no OSA (??)--Dr. [name] said he needs another sleep study as of 2013   [place]     TEE WITHOUT CARDIOVERSION N/A 12/08/2018   Procedure: TRANSESOPHAGEAL ECHOCARDIOGRAM (TEE);  Surgeon: Grace Isaac, MD;  Location: [place];  Service: Open Heart Surgery;  Laterality: N/A;   TONSILLECTOMY AND ADENOIDECTOMY     TRANSTHORACIC ECHOCARDIOGRAM  12/07/2018   In setting of non-STEMI: EF 55-60%.  GR 1 DD.  Mild  inferior-inferolateral and apical lateral HK.  Normal RV.  Normal valves   UPPER GI ENDOSCOPY  2000 & 2011   dilation 2000    Current Medications: Current Meds  Medication Sig   aspirin EC 81 MG tablet Take 81 mg by mouth daily.   ezetimibe (ZETIA) 10 MG tablet Take 1 tablet (10 mg total) by mouth daily.   metoprolol tartrate (LOPRESSOR) 25 MG tablet TAKE 1 TABLET BY MOUTH TWICE A DAY   Multiple Vitamins-Minerals (MULTIVITAMIN MEN PO) Take by mouth daily.   omeprazole (PRILOSEC) 20 MG capsule Take 20 mg by mouth daily.   rosuvastatin (CRESTOR) 20 MG tablet TAKE 1 TABLET BY MOUTH EVERY DAY   silodosin (RAPAFLO) 8 MG CAPS capsule Take 8 mg by mouth daily.     Allergies:   Sulfonamide derivatives   Social History   Socioeconomic History   Marital status: Married    Spouse name: Not on file   Number of children: 1   Years of education: Not on file   Highest education level: Not on file  Occupational History   Not on file  Tobacco Use   Smoking status: Never   Smokeless tobacco: Never  Vaping Use   Vaping Use: Never used  Substance and Sexual Activity   Alcohol use: Yes    Alcohol/week: 1.0 standard drink    Types: 1 Cans of beer per week    Comment:  2 beers/ month   Drug use: No   Sexual activity: Yes  Other Topics Concern   Not on file  Social History Narrative   Married, 1 son.   Lives in [place].   Product/process development scientist -self-employed   No tob, no alc, no drugs.      Social Determinants of Health   Financial Resource Strain: Low Risk    Difficulty of Paying Living Expenses: Not hard at all  Food Insecurity: No Food Insecurity   Worried About Charity fundraiser in the Last Year: Never true   [place] in the Last Year: Never true  Transportation Needs: No Transportation Needs   Lack of Transportation (Medical): No   Lack of Transportation (Non-Medical): No  Physical Activity: Insufficiently Active   Days of Exercise per Week: 3 days    Minutes of Exercise per Session: 30 min  Stress: No Stress Concern Present   Feeling of Stress : Not at all  Social Connections: Moderately Integrated   Frequency of Communication with Friends and Family: More than three times a week   Frequency of Social Gatherings with Friends and Family: More than three times a week   Attends Religious Services: More than 4 times per year   Active Member of Clubs or Organizations: No  Attends Archivist Meetings: Never   Marital Status: Married     Family History: The patient's family history includes COPD in his mother; Cancer in his mother; Heart attack in his maternal grandfather and paternal grandfather; Heart attack (age of onset: 45) in his father. There is no history of Colon cancer, Stomach cancer, Diabetes, or Stroke.  ROS:   Please see the history of present illness.     All other systems reviewed and are negative.  EKGs/Labs/Other Studies Reviewed:    The following studies were reviewed today:  Left heart cath 12/07/18: Ost LM lesion is 75% stenosed. Prox Cx lesion is 90% stenosed. Prox LAD to Mid LAD lesion is 25% stenosed. The left ventricular systolic function is normal. LV end diastolic pressure is normal. LVEDP 10 mm Hg. The left ventricular ejection fraction is 35-45% by visual estimate. There is no aortic valve stenosis.   Ulcerated lesions in the ostial left main and proximal circumflex.  Will plan for surgery consult.     Anterior hypokinesis from stunned myocardium.  I suspect LV function would return with revascularization.   EKG:  EKG is not ordered today.    Recent Labs: 07/22/2021: BUN 17; Creatinine, Ser 0.89; Hemoglobin 15.2; Platelets 175.0; Potassium 4.5; Sodium 139 01/30/2022: ALT 19; TSH 1.310  Recent Lipid Panel    Component Value Date/Time   CHOL 139 01/30/2022 1134   TRIG 120 01/30/2022 1134   HDL 46 01/30/2022 1134   CHOLHDL 3.0 01/30/2022 1134   CHOLHDL 3 07/22/2021 1046   VLDL 23.8  07/22/2021 1046   LDLCALC 71 01/30/2022 1134   LDLDIRECT 116.0 07/14/2018 0905     Risk Assessment/Calculations:           Physical Exam:    VS:  BP (!) 126/58   Pulse 68   Ht '5\' 11"'$  (1.803 m)   Wt 228 lb 9.6 oz (103.7 kg)   SpO2 96%   BMI 31.88 kg/m     Wt Readings from Last 3 Encounters:  02/05/22 228 lb 9.6 oz (103.7 kg)  08/05/21 229 lb 6.4 oz (104.1 kg)  07/22/21 230 lb 6.4 oz (104.5 kg)     GEN:  Well nourished, well developed in no acute distress HEENT: Normal NECK: No JVD; No carotid bruits LYMPHATICS: No lymphadenopathy CARDIAC: RRR, no murmurs, rubs, gallops RESPIRATORY:  Clear to auscultation without rales, wheezing or rhonchi  ABDOMEN: Soft, non-tender, non-distended MUSCULOSKELETAL:  No edema; No deformity  SKIN: Warm and dry NEUROLOGIC:  Alert and oriented x 3 PSYCHIATRIC:  Normal affect   ASSESSMENT:    1. Coronary artery disease involving native coronary artery of native heart without angina pectoris   2. S/P CABG x 2   3. Hyperlipidemia with target LDL less than 70   4. Gastroesophageal reflux disease without esophagitis    PLAN:    In order of problems listed above:  CAD Left main CAD s/p CABG x2 Per Dr. Ellyn Hack, okay to hold aspirin 5 to 7 days for surgical procedures Dr. Ellyn Hack will plan screening Myoview in 2025 Can complete more than 4.0 METS without angina.    Hyperlipidemia with LDL goal less than 70 Intolerant to 80 mg Lipitor 07/22/2021: VLDL 23.8 01/30/2022: Cholesterol, Total 139; HDL 46; LDL Chol Calc (NIH) 71; Triglycerides 120 He is prediabetic. He is doing well on 20 mg crestor. We discussed trying to achieve lower LDL and he is willing to try zetia.  Will add this.    GERD Continue  prilosec   Follow-up with Dr. Ellyn Hack in 6 months.    Medication Adjustments/Labs and Tests Ordered: Current medicines are reviewed at length with the patient today.  Concerns regarding medicines are outlined above.  No orders of the  defined types were placed in this encounter.  Meds ordered this encounter  Medications   ezetimibe (ZETIA) 10 MG tablet    Sig: Take 1 tablet (10 mg total) by mouth daily.    Dispense:  90 tablet    Refill:  3    Patient Instructions  Medication Instructions:  START Zetia 10 mg daily *If you need a refill on your cardiac medications before your next appointment, please call your pharmacy*  Lab Work: NONE ordered at this time of appointment   If you have labs (blood work) drawn today and your tests are completely normal, you will receive your results only by: [place] (if you have MyChart) OR A paper copy in the mail If you have any lab test that is abnormal or we need to change your treatment, we will call you to review the results.  Testing/Procedures: NONE ordered at this time of appointment   Follow-Up: At [facility], you and your health needs are our priority.  As part of our continuing mission to provide you with exceptional heart care, we have created designated Provider Care Teams.  These Care Teams include your primary Cardiologist (physician) and Advanced Practice Providers (APPs -  Physician Assistants and Nurse Practitioners) who all work together to provide you with the care you need, when you need it.   Your next appointment:   6 month(s)  The format for your next appointment:   In Person  Provider:   Glenetta Hew, MD     Other Instructions   Important Information About Sugar         Signed, Ledora Bottcher, [place]  02/05/2022 9:52 AM    [place]

## 2022-02-05 ENCOUNTER — Ambulatory Visit: Payer: Medicare HMO | Admitting: Physician Assistant

## 2022-02-05 ENCOUNTER — Encounter: Payer: Self-pay | Admitting: Physician Assistant

## 2022-02-05 VITALS — BP 126/58 | HR 68 | Ht 71.0 in | Wt 228.6 lb

## 2022-02-05 DIAGNOSIS — E785 Hyperlipidemia, unspecified: Secondary | ICD-10-CM | POA: Diagnosis not present

## 2022-02-05 DIAGNOSIS — I2510 Atherosclerotic heart disease of native coronary artery without angina pectoris: Secondary | ICD-10-CM

## 2022-02-05 DIAGNOSIS — Z951 Presence of aortocoronary bypass graft: Secondary | ICD-10-CM

## 2022-02-05 DIAGNOSIS — K219 Gastro-esophageal reflux disease without esophagitis: Secondary | ICD-10-CM | POA: Diagnosis not present

## 2022-02-05 MED ORDER — EZETIMIBE 10 MG PO TABS: 10.0000 mg | ORAL_TABLET | Freq: Every day | ORAL | 3 refills | Status: DC

## 2022-02-05 NOTE — Patient Instructions (Signed)
Medication Instructions:  START Zetia 10 mg daily *If you need a refill on your cardiac medications before your next appointment, please call your pharmacy*  Lab Work: NONE ordered at this time of appointment   If you have labs (blood work) drawn today and your tests are completely normal, you will receive your results only by: [place] (if you have MyChart) OR A paper copy in the mail If you have any lab test that is abnormal or we need to change your treatment, we will call you to review the results.  Testing/Procedures: NONE ordered at this time of appointment   Follow-Up: At [facility], you and your health needs are our priority.  As part of our continuing mission to provide you with exceptional heart care, we have created designated Provider Care Teams.  These Care Teams include your primary Cardiologist (physician) and Advanced Practice Providers (APPs -  Physician Assistants and Nurse Practitioners) who all work together to provide you with the care you need, when you need it.   Your next appointment:   6 month(s)  The format for your next appointment:   In Person  Provider:   Glenetta Hew, MD     Other Instructions   Important Information About Sugar

## 2022-02-07 ENCOUNTER — Encounter: Payer: Self-pay | Admitting: Internal Medicine

## 2022-03-05 DIAGNOSIS — N401 Enlarged prostate with lower urinary tract symptoms: Secondary | ICD-10-CM | POA: Diagnosis not present

## 2022-03-05 DIAGNOSIS — R3914 Feeling of incomplete bladder emptying: Secondary | ICD-10-CM | POA: Diagnosis not present

## 2022-03-05 DIAGNOSIS — R351 Nocturia: Secondary | ICD-10-CM | POA: Diagnosis not present

## 2022-03-10 ENCOUNTER — Encounter: Payer: Self-pay | Admitting: Family Medicine

## 2022-03-17 ENCOUNTER — Other Ambulatory Visit: Payer: Self-pay

## 2022-03-17 MED ORDER — ROSUVASTATIN CALCIUM 20 MG PO TABS: 20.0000 mg | ORAL_TABLET | Freq: Every day | ORAL | 3 refills | Status: DC

## 2022-04-04 ENCOUNTER — Other Ambulatory Visit: Payer: Self-pay | Admitting: Cardiology

## 2022-04-08 ENCOUNTER — Ambulatory Visit (AMBULATORY_SURGERY_CENTER): Payer: Self-pay

## 2022-04-08 VITALS — Ht 71.0 in | Wt 229.0 lb

## 2022-04-08 DIAGNOSIS — Z1211 Encounter for screening for malignant neoplasm of colon: Secondary | ICD-10-CM

## 2022-04-08 MED ORDER — NA SULFATE-K SULFATE-MG SULF 17.5-3.13-1.6 GM/177ML PO SOLN: 1.0000 | ORAL | 0 refills | Status: DC

## 2022-04-08 NOTE — Progress Notes (Signed)
No egg or soy allergy known to patient  No issues known to pt with past sedation with any surgeries or procedures Patient denies ever being told they had issues or difficulty with intubation  No FH of Malignant Hyperthermia Pt is not on diet pills Pt is not on  home 02  Pt is not on blood thinners  Pt denies issues with constipation  No A fib or A flutter Have any cardiac testing pending--denied Pt instructed to use Singlecare.com or GoodRx for a price reduction on prep   

## 2022-04-28 ENCOUNTER — Encounter: Payer: Self-pay | Admitting: Internal Medicine

## 2022-05-01 ENCOUNTER — Telehealth: Payer: Self-pay | Admitting: Internal Medicine

## 2022-05-01 NOTE — Telephone Encounter (Signed)
Patient has an upcoming procedure.  He has questions about whether or not he should take certain medications.  Please call patient and advise.  Thank you.

## 2022-05-01 NOTE — Telephone Encounter (Signed)
Answered all questions from the pt. Encouraged patient to take Metoprolol day of procedure at 630 am. Pt verbalizes understanding.

## 2022-05-06 ENCOUNTER — Ambulatory Visit (AMBULATORY_SURGERY_CENTER): Payer: Medicare HMO | Admitting: Internal Medicine

## 2022-05-06 ENCOUNTER — Encounter: Payer: Self-pay | Admitting: Internal Medicine

## 2022-05-06 VITALS — BP 118/63 | HR 57 | Temp 96.2°F | Resp 12 | Ht 71.0 in | Wt 229.0 lb

## 2022-05-06 DIAGNOSIS — Z1211 Encounter for screening for malignant neoplasm of colon: Secondary | ICD-10-CM

## 2022-05-06 DIAGNOSIS — R7303 Prediabetes: Secondary | ICD-10-CM | POA: Diagnosis not present

## 2022-05-06 DIAGNOSIS — G4730 Sleep apnea, unspecified: Secondary | ICD-10-CM | POA: Diagnosis not present

## 2022-05-06 MED ORDER — SODIUM CHLORIDE 0.9 % IV SOLN: 500.0000 mL | Freq: Once | INTRAVENOUS | Status: DC

## 2022-05-06 NOTE — Patient Instructions (Signed)
   HANDOUT ON DIVERTICULOSIS GIVEN TO YOU TODAY   YOU HAD AN ENDOSCOPIC PROCEDURE TODAY AT [facility]:   Refer to the procedure report that was given to you for any specific questions about what was found during the examination.  If the procedure report does not answer your questions, please call your gastroenterologist to clarify.  If you requested that your care partner not be given the details of your procedure findings, then the procedure report has been included in a sealed envelope for you to review at your convenience later.  YOU SHOULD EXPECT: Some feelings of bloating in the abdomen. Passage of more gas than usual.  Walking can help get rid of the air that was put into your GI tract during the procedure and reduce the bloating. If you had a lower endoscopy (such as a colonoscopy or flexible sigmoidoscopy) you may notice spotting of blood in your stool or on the toilet paper. If you underwent a bowel prep for your procedure, you may not have a normal bowel movement for a few days.  Please Note:  You might notice some irritation and congestion in your nose or some drainage.  This is from the oxygen used during your procedure.  There is no need for concern and it should clear up in a day or so.  SYMPTOMS TO REPORT IMMEDIATELY:  Following lower endoscopy (colonoscopy or flexible sigmoidoscopy):  Excessive amounts of blood in the stool  Significant tenderness or worsening of abdominal pains  Swelling of the abdomen that is new, acute  Fever of 100F or higher   For urgent or emergent issues, a gastroenterologist can be reached at any hour by calling [phone]. Do not use MyChart messaging for urgent concerns.    DIET:  We do recommend a small meal at first, but then you may proceed to your regular diet.  Drink plenty of fluids but you should avoid alcoholic beverages for 24 hours.  ACTIVITY:  You should plan to take it easy for the rest of today and you should NOT  DRIVE or use heavy machinery until tomorrow (because of the sedation medicines used during the test).    FOLLOW UP: Our staff will call the number listed on your records the next business day following your procedure.  We will call around 7:15- 8:00 am to check on you and address any questions or concerns that you may have regarding the information given to you following your procedure. If we do not reach you, we will leave a message.  If you develop any symptoms (ie: fever, flu-like symptoms, shortness of breath, cough etc.) before then, please call [phone].  If you test positive for Covid 19 in the 2 weeks post procedure, please call and report this information to [place].    If any biopsies were taken you will be contacted by phone or by letter within the next 1-3 weeks.  Please call us at [phone] if you have not heard about the biopsies in 3 weeks.    SIGNATURES/CONFIDENTIALITY: You and/or your care partner have signed paperwork which will be entered into your electronic medical record.  These signatures attest to the fact that that the information above on your After Visit Summary has been reviewed and is understood.  Full responsibility of the confidentiality of this discharge information lies with you and/or your care-partner.

## 2022-05-06 NOTE — Progress Notes (Signed)
Sedate, gd SR, tolerated procedure well, VSS, report to RN 

## 2022-05-06 NOTE — Op Note (Signed)
[facility] Patient Name: Tyler [name] Procedure Date: 05/06/2022 9:45 AM MRN: [id number] Endoscopist: Docia Chuck. [name] , MD Age: 71 Referring MD:  Date of Birth: [birth year] Gender: Male Account #: [id number] Procedure:                Colonoscopy Indications:              Screening for colorectal malignant neoplasm.                            Previous examination 1999 was normal Medicines:                Monitored Anesthesia Care Procedure:                Pre-Anesthesia Assessment:                           - Prior to the procedure, a History and Physical                            was performed, and patient medications and                            allergies were reviewed. The patient's tolerance of                            previous anesthesia was also reviewed. The risks                            and benefits of the procedure and the sedation                            options and risks were discussed with the patient.                            All questions were answered, and informed consent                            was obtained. Prior Anticoagulants: The patient has                            taken no previous anticoagulant or antiplatelet                            agents. After reviewing the risks and benefits, the                            patient was deemed in satisfactory condition to                            undergo the procedure.                           After obtaining informed consent, the colonoscope  was passed under direct vision. Throughout the                            procedure, the patient's blood pressure, pulse, and                            oxygen saturations were monitored continuously. The                            CF [id number] #[id number] was introduced through the anus                            and advanced to the the cecum, identified by                            appendiceal orifice and ileocecal valve. The                             ileocecal valve, appendiceal orifice, and rectum                            were photographed. The quality of the bowel                            preparation was excellent. The colonoscopy was                            performed without difficulty. The patient tolerated                            the procedure well. The bowel preparation used was                            SUPREP via split dose instruction. Scope In: 9:48:08 AM Scope Out: 10:00:11 AM Scope Withdrawal Time: 0 hours 10 minutes 11 seconds  Total Procedure Duration: 0 hours 12 minutes 3 seconds  Findings:                 The entire examined colon appeared normal on direct                            and retroflexion views, save isolated right-sided                            diverticulum. Complications:            No immediate complications. Estimated blood loss:                            None. Estimated Blood Loss:     Estimated blood loss: none. Impression:               - The entire examined colon is normal on direct and  retroflexion views, save isolated right-sided                            diverticula.                           - No specimens collected. Recommendation:           - Repeat colonoscopy is not recommended for                            screening purposes.                           - Patient has a contact number available for                            emergencies. The signs and symptoms of potential                            delayed complications were discussed with the                            patient. Return to normal activities tomorrow.                            Written discharge instructions were provided to the                            patient.                           - Resume previous diet.                           - Continue present medications. Docia Chuck. [name], MD 05/06/2022 10:06:55 AM This report has been signed electronically.

## 2022-05-06 NOTE — Progress Notes (Signed)
Pt's states no medical or surgical changes since previsit or office visit. 

## 2022-05-06 NOTE — Progress Notes (Signed)
HISTORY OF PRESENT ILLNESS:  Tyler [name] is a 71 y.o. male who presents for screening colonoscopy.  Previous examination in 1999 was normal.  No complaints  REVIEW OF SYSTEMS:  All non-GI ROS negative.   Past Medical History:  Diagnosis Date   Allergy    Arthritis    BPH (benign prostatic hypertrophy)    Flomax not helpful 2018/19 per pt report   Chronic pain of right knee    Left knee as well (osteoarthritis).  Dr. Durward Fortes   Colon polyps 2013   NON-adenomatous 2013---recall 10 yrs.   Elevated PSA 07/10/2016   Dr. Gaynelle Arabian saw him 07/15/16, did f/u PSA and it was 2.78: f/u was recommended but pt did not do this as of 07/2017.  Repeat PSA here 07/2017 was 3.1, with 19% free (free a little low).  Pt then followed up with Dr. Gaynelle Arabian and tx for BPH w/plan of repeat PSA 1 yr.  PSA 07/2018 down to 3.21.   Erectile dysfunction    Urol rx'd sildenafil '20mg'$  tabs 11/2017.   Fatigue    + excessive daytime somnolence   GERD (gastroesophageal reflux disease)    Hallux limitus 04/2016   1st MPJ joint R foot.  Diclofenac helpful.  Injected by podiatrist 11/2016.   Hyperlipidemia    Dr. Debara Pickett   Hypogonadism male    Left main coronary artery disease 12/07/2018   NSTEMI:  ost LM ulcerated 75%, pCx 90%, EF 35-45% --> CABG x2 (LIMA-LAD, SVG-Cx) 12/14/2018.  LV function normal on Intra-Op TEE   NSTEMI (non-ST elevated myocardial infarction) ([place]) 12/2018   Critical left main and proximal circumflex disease--CABG x2 (Dr. Servando Snare.  LIMA-LAD, SVG-LCx)   Postoperative atrial fibrillation ([place]) 12/2018   Converted on amiodarone; amio likely to be only short term   Prediabetes    A1c 5.8% 2014  A1c 6.1% Nov 2021. A1c 6.4% 07/2021   Sleep apnea     Past Surgical History:  Procedure Laterality Date   COLONOSCOPY  1998 & 2013    Dr [name]; 2 benign polyps 2013--recall 2023.   CORONARY ARTERY BYPASS GRAFT N/A 12/08/2018   Procedure: CORONARY ARTERY BYPASS GRAFTING (CABG) x 2, SVG TO  OM1, LIMA  TO LAD, USING LEFT INTERNAL MAMMARY ARTERY AND RIGHT GREATER SAPHENOUS VEIN HARVESTED ENDOSCOPICALLY;  Surgeon: Grace Isaac, MD;  Location: [place] [facility];  Service: Open Heart Surgery;  Laterality: N/A;   hydrocoelectomy     LEFT HEART CATH AND CORONARY ANGIOGRAPHY N/A 12/07/2018   Procedure: LEFT HEART CATH AND CORONARY ANGIOGRAPHY;  Surgeon: Jettie Booze, MD;  Location: [facility];; Ost LM 75%-ulcerated, pCx 90%, EF 35-45% --> referred for CABG   nocturnal polysomn  1990s   Sleep lab: no OSA (??)--Dr. [name] said he needs another sleep study as of 2013   [facility]     TEE WITHOUT CARDIOVERSION N/A 12/08/2018   Procedure: TRANSESOPHAGEAL ECHOCARDIOGRAM (TEE);  Surgeon: Grace Isaac, MD;  Location: [place] [facility];  Service: Open Heart Surgery;  Laterality: N/A;   TONSILLECTOMY AND ADENOIDECTOMY     TRANSTHORACIC ECHOCARDIOGRAM  12/07/2018   In setting of non-STEMI: EF 55-60%.  GR 1 DD.  Mild inferior-inferolateral and apical lateral HK.  Normal RV.  Normal valves   UPPER GI ENDOSCOPY  2000 & 2011   dilation 2000    Social History EPIMENIO SCHETTER  reports that he has never smoked. He has never used smokeless tobacco. He reports that he does not currently use alcohol  after a past usage of about 1.0 standard drink of alcohol per week. He reports that he does not use drugs.  family history includes COPD in his mother; Cancer in his mother; Heart attack in his maternal grandfather and paternal grandfather; Heart attack (age of onset: 60) in his father.  Allergies  Allergen Reactions   Sulfonamide Derivatives     REACTION: rash Because of a history of documented adverse serious drug reaction;Medi Alert bracelet  is recommended       PHYSICAL EXAMINATION: Vital signs: BP 131/69   Pulse 63   Temp (!) 96.2 F (35.7 C)   Ht '5\' 11"'$  (1.803 m)   Wt 229 lb (103.9 kg)   SpO2 96%   BMI 31.94 kg/m  General: Well-developed, well-nourished, no acute distress HEENT:  Sclerae are anicteric, conjunctiva pink. Oral mucosa intact Lungs: Clear Heart: Regular Abdomen: soft, nontender, nondistended, no obvious ascites, no peritoneal signs, normal bowel sounds. No organomegaly. Extremities: No edema Psychiatric: alert and oriented x3. Cooperative      ASSESSMENT:  Colon cancer screening   PLAN:   Screening colonoscopy

## 2022-05-07 ENCOUNTER — Telehealth: Payer: Self-pay

## 2022-05-07 NOTE — Telephone Encounter (Signed)
Attempted to reach pt. With follow-up call following endoscopic procedure 05/06/2022.  LM On pt. Voice mail to call if he has any questions or concerns.

## 2022-05-21 ENCOUNTER — Ambulatory Visit (INDEPENDENT_AMBULATORY_CARE_PROVIDER_SITE_OTHER): Payer: Medicare HMO

## 2022-05-21 VITALS — Wt 229.1 lb

## 2022-05-21 DIAGNOSIS — Z0000 Encounter for general adult medical examination without abnormal findings: Secondary | ICD-10-CM

## 2022-05-21 NOTE — Patient Instructions (Signed)
Tyler [name] , Thank you for taking time to come for your Medicare Wellness Visit. I appreciate your ongoing commitment to your health goals. Please review the following plan we discussed and let me know if I can assist you in the future.   Screening recommendations/referrals: Colonoscopy: done 05/06/22 repeat every 10 years  Recommended yearly ophthalmology/optometry visit for glaucoma screening and checkup Recommended yearly dental visit for hygiene and checkup  Vaccinations: Influenza vaccine: done 06/20/21 repeat every year  Pneumococcal vaccine: Up to date Tdap vaccine: done 08/07/21 repeat every 10 years  Shingles vaccine: completed 1/6, 03/08/19   Covid-19: completed 1/20, 2/10, 06/16/20  Advanced directives: Please bring a copy of your health care power of attorney and living will to the office at your convenience.  Conditions/risks identified: maintain health   Next appointment: Follow up in one year for your annual wellness visit.   Preventive Care 80 Years and Older, Male Preventive care refers to lifestyle choices and visits with your health care provider that can promote health and wellness. What does preventive care include? A yearly physical exam. This is also called an annual well check. Dental exams once or twice a year. Routine eye exams. Ask your health care provider how often you should have your eyes checked. Personal lifestyle choices, including: Daily care of your teeth and gums. Regular physical activity. Eating a healthy diet. Avoiding tobacco and drug use. Limiting alcohol use. Practicing safe sex. Taking low doses of aspirin every day. Taking vitamin and mineral supplements as recommended by your health care provider. What happens during an annual well check? The services and screenings done by your health care provider during your annual well check will depend on your age, overall health, lifestyle risk factors, and family history of disease. Counseling   Your health care provider may ask you questions about your: Alcohol use. Tobacco use. Drug use. Emotional well-being. Home and relationship well-being. Sexual activity. Eating habits. History of falls. Memory and ability to understand (cognition). Work and work Statistician. Screening  You may have the following tests or measurements: Height, weight, and BMI. Blood pressure. Lipid and cholesterol levels. These may be checked every 5 years, or more frequently if you are over 70 years old. Skin check. Lung cancer screening. You may have this screening every year starting at age 7 if you have a 30-pack-year history of smoking and currently smoke or have quit within the past 15 years. Fecal occult blood test (FOBT) of the stool. You may have this test every year starting at age 48. Flexible sigmoidoscopy or colonoscopy. You may have a sigmoidoscopy every 5 years or a colonoscopy every 10 years starting at age 22. Prostate cancer screening. Recommendations will vary depending on your family history and other risks. Hepatitis C blood test. Hepatitis B blood test. Sexually transmitted disease (STD) testing. Diabetes screening. This is done by checking your blood sugar (glucose) after you have not eaten for a while (fasting). You may have this done every 1-3 years. Abdominal aortic aneurysm (AAA) screening. You may need this if you are a current or former smoker. Osteoporosis. You may be screened starting at age 53 if you are at high risk. Talk with your health care provider about your test results, treatment options, and if necessary, the need for more tests. Vaccines  Your health care provider may recommend certain vaccines, such as: Influenza vaccine. This is recommended every year. Tetanus, diphtheria, and acellular pertussis (Tdap, Td) vaccine. You may need a Td booster every 10  years. Zoster vaccine. You may need this after age 43. Pneumococcal 13-valent conjugate (PCV13) vaccine.  One dose is recommended after age 48. Pneumococcal polysaccharide (PPSV23) vaccine. One dose is recommended after age 8. Talk to your health care provider about which screenings and vaccines you need and how often you need them. This information is not intended to replace advice given to you by your health care provider. Make sure you discuss any questions you have with your health care provider. Document Released: 09/21/2015 Document Revised: 05/14/2016 Document Reviewed: 06/26/2015 Elsevier Interactive Patient Education  2017 [place] Prevention in the Home Falls can cause injuries. They can happen to people of all ages. There are many things you can do to make your home safe and to help prevent falls. What can I do on the outside of my home? Regularly fix the edges of walkways and driveways and fix any cracks. Remove anything that might make you trip as you walk through a door, such as a raised step or threshold. Trim any bushes or trees on the path to your home. Use bright outdoor lighting. Clear any walking paths of anything that might make someone trip, such as rocks or tools. Regularly check to see if handrails are loose or broken. Make sure that both sides of any steps have handrails. Any raised decks and porches should have guardrails on the edges. Have any leaves, snow, or ice cleared regularly. Use sand or salt on walking paths during winter. Clean up any spills in your garage right away. This includes oil or grease spills. What can I do in the bathroom? Use night lights. Install grab bars by the toilet and in the tub and shower. Do not use towel bars as grab bars. Use non-skid mats or decals in the tub or shower. If you need to sit down in the shower, use a plastic, non-slip stool. Keep the floor dry. Clean up any water that spills on the floor as soon as it happens. Remove soap buildup in the tub or shower regularly. Attach bath mats securely with double-sided  non-slip rug tape. Do not have throw rugs and other things on the floor that can make you trip. What can I do in the bedroom? Use night lights. Make sure that you have a light by your bed that is easy to reach. Do not use any sheets or blankets that are too big for your bed. They should not hang down onto the floor. Have a firm chair that has side arms. You can use this for support while you get dressed. Do not have throw rugs and other things on the floor that can make you trip. What can I do in the kitchen? Clean up any spills right away. Avoid walking on wet floors. Keep items that you use a lot in easy-to-reach places. If you need to reach something above you, use a strong step stool that has a grab bar. Keep electrical cords out of the way. Do not use floor polish or wax that makes floors slippery. If you must use wax, use non-skid floor wax. Do not have throw rugs and other things on the floor that can make you trip. What can I do with my stairs? Do not leave any items on the stairs. Make sure that there are handrails on both sides of the stairs and use them. Fix handrails that are broken or loose. Make sure that handrails are as long as the stairways. Check any carpeting to make sure  that it is firmly attached to the stairs. Fix any carpet that is loose or worn. Avoid having throw rugs at the top or bottom of the stairs. If you do have throw rugs, attach them to the floor with carpet tape. Make sure that you have a light switch at the top of the stairs and the bottom of the stairs. If you do not have them, ask someone to add them for you. What else can I do to help prevent falls? Wear shoes that: Do not have high heels. Have rubber bottoms. Are comfortable and fit you well. Are closed at the toe. Do not wear sandals. If you use a stepladder: Make sure that it is fully opened. Do not climb a closed stepladder. Make sure that both sides of the stepladder are locked into place. Ask  someone to hold it for you, if possible. Clearly mark and make sure that you can see: Any grab bars or handrails. First and last steps. Where the edge of each step is. Use tools that help you move around (mobility aids) if they are needed. These include: Canes. Walkers. Scooters. Crutches. Turn on the lights when you go into a dark area. Replace any light bulbs as soon as they burn out. Set up your furniture so you have a clear path. Avoid moving your furniture around. If any of your floors are uneven, fix them. If there are any pets around you, be aware of where they are. Review your medicines with your doctor. Some medicines can make you feel dizzy. This can increase your chance of falling. Ask your doctor what other things that you can do to help prevent falls. This information is not intended to replace advice given to you by your health care provider. Make sure you discuss any questions you have with your health care provider. Document Released: 06/21/2009 Document Revised: 01/31/2016 Document Reviewed: 09/29/2014 Elsevier Interactive Patient Education  2017 [employer].

## 2022-05-21 NOTE — Progress Notes (Addendum)
Virtual Visit via Telephone Note  I connected with  Tyler [name] on 05/21/22 at 11:30 AM EDT by telephone and verified that I am speaking with the correct person using two identifiers.  Medicare Annual Wellness visit completed telephonically due to Covid-19 pandemic.   Persons participating in this call: This Health Coach and this patient.   Location: Patient: home Provider: office    I discussed the limitations, risks, security and privacy concerns of performing an evaluation and management service by telephone and the availability of in person appointments. The patient expressed understanding and agreed to proceed.  Unable to perform video visit due to video visit attempted and failed and/or patient does not have video capability.   Some vital signs may be absent or patient reported.   Willette Brace, LPN   Subjective:   Tyler [name] is a 71 y.o. male who presents for Medicare Annual/Subsequent preventive examination.  Review of Systems     Cardiac Risk Factors include: advanced age (>41mn, >>53women);hypertension;male gender     Objective:    There were no vitals filed for this visit. There is no height or weight on file to calculate BMI.     05/21/2022   11:37 AM 05/30/2021    1:22 PM 12/06/2018    6:04 PM 12/06/2018    3:44 PM 07/13/2015    1:54 AM  Advanced Directives  Does Patient Have a Medical Advance Directive? Yes Yes Yes No No  Type of AParamedicof APacificaLiving will Living will HCollinsLiving will    Does patient want to make changes to medical advance directive?   No - Patient declined    Copy of HNeyin Chart? No - copy requested  No - copy requested    Would patient like information on creating a medical advance directive?     No - patient declined information    Current Medications (verified) Outpatient Encounter Medications as of 05/21/2022  Medication Sig   aspirin  EC 81 MG tablet Take 81 mg by mouth daily.   ezetimibe (ZETIA) 10 MG tablet Take 1 tablet (10 mg total) by mouth daily.   metoprolol tartrate (LOPRESSOR) 25 MG tablet TAKE 1 TABLET BY MOUTH TWICE A DAY   Multiple Vitamins-Minerals (MULTIVITAMIN MEN PO) Take by mouth daily.   omeprazole (PRILOSEC) 20 MG capsule Take 20 mg by mouth daily.   rosuvastatin (CRESTOR) 20 MG tablet Take 1 tablet (20 mg total) by mouth daily. (1 tablet daily Monday Wednesday Friday, 2 tablets('40mg'$ ) daily Tuesday, Thursday, Saturday, Sunday).   silodosin (RAPAFLO) 8 MG CAPS capsule Take 8 mg by mouth daily.   No facility-administered encounter medications on file as of 05/21/2022.    Allergies (verified) Sulfonamide derivatives   History: Past Medical History:  Diagnosis Date   Allergy    Arthritis    BPH (benign prostatic hypertrophy)    Flomax not helpful 2018/19 per pt report   Chronic pain of right knee    Left knee as well (osteoarthritis).  Dr. WDurward Fortes  Colon polyps 2013   NON-adenomatous 2013---recall 10 yrs.   Elevated PSA 07/10/2016   Dr. TGaynelle Arabiansaw him 07/15/16, did f/u PSA and it was 2.78: f/u was recommended but pt did not do this as of 07/2017.  Repeat PSA here 07/2017 was 3.1, with 19% free (free a little low).  Pt then followed up with Dr. TGaynelle Arabianand tx for BPH w/plan of repeat PSA 1  yr.  PSA 07/2018 down to 3.21.   Erectile dysfunction    Urol rx'd sildenafil '20mg'$  tabs 11/2017.   Fatigue    + excessive daytime somnolence   GERD (gastroesophageal reflux disease)    Hallux limitus 04/2016   1st MPJ joint R foot.  Diclofenac helpful.  Injected by podiatrist 11/2016.   Hyperlipidemia    Dr. Debara Pickett   Hypogonadism male    Left main coronary artery disease 12/07/2018   NSTEMI:  ost LM ulcerated 75%, pCx 90%, EF 35-45% --> CABG x2 (LIMA-LAD, SVG-Cx) 12/14/2018.  LV function normal on Intra-Op TEE   NSTEMI (non-ST elevated myocardial infarction) ([place]) 12/2018   Critical left main and proximal  circumflex disease--CABG x2 (Dr. Servando Snare.  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 A1c 6.4% 07/2021   Sleep apnea    Past Surgical History:  Procedure Laterality Date   COLONOSCOPY  1998 & 2013    Dr [name]; 2 benign polyps 2013--recall 2023.   CORONARY ARTERY BYPASS GRAFT N/A 12/08/2018   Procedure: CORONARY ARTERY BYPASS GRAFTING (CABG) x 2, SVG TO  OM1, LIMA TO LAD, USING LEFT INTERNAL MAMMARY ARTERY AND RIGHT GREATER SAPHENOUS VEIN HARVESTED ENDOSCOPICALLY;  Surgeon: Grace Isaac, MD;  Location: [place];  Service: Open Heart Surgery;  Laterality: N/A;   hydrocoelectomy     LEFT HEART CATH AND CORONARY ANGIOGRAPHY N/A 12/07/2018   Procedure: LEFT HEART CATH AND CORONARY ANGIOGRAPHY;  Surgeon: Jettie Booze, MD;  Location: [facility];; Ost LM 75%-ulcerated, pCx 90%, EF 35-45% --> referred for CABG   nocturnal polysomn  1990s   Sleep lab: no OSA (??)--Dr. [name] said he needs another sleep study as of 2013   [place]     TEE WITHOUT CARDIOVERSION N/A 12/08/2018   Procedure: TRANSESOPHAGEAL ECHOCARDIOGRAM (TEE);  Surgeon: Grace Isaac, MD;  Location: [place];  Service: Open Heart Surgery;  Laterality: N/A;   TONSILLECTOMY AND ADENOIDECTOMY     TRANSTHORACIC ECHOCARDIOGRAM  12/07/2018   In setting of non-STEMI: EF 55-60%.  GR 1 DD.  Mild inferior-inferolateral and apical lateral HK.  Normal RV.  Normal valves   UPPER GI ENDOSCOPY  2000 & 2011   dilation 2000   Family History  Problem Relation Age of Onset   Cancer Mother        Gynecologic   COPD Mother    Heart attack Father 20        CHF   Heart attack Maternal Grandfather        > 59   Heart attack Paternal Grandfather        >55   Colon cancer Neg Hx    Stomach cancer Neg Hx    Diabetes Neg Hx    Stroke Neg Hx    Colon polyps Neg Hx    Esophageal  cancer Neg Hx    Rectal cancer Neg Hx    Social History   Socioeconomic History   Marital status: Married    Spouse name: Not on file   Number of children: 1   Years of education: Not on file   Highest education level: Not on file  Occupational History   Not on file  Tobacco Use   Smoking status: Never   Smokeless tobacco: Never  Vaping Use   Vaping Use: Never used  Substance and Sexual Activity   Alcohol use: Not Currently    Alcohol/week: 1.0 standard drink of alcohol    Types: 1 Cans of beer per week    Comment:  2 beers/ month   Drug use: No   Sexual activity: Yes  Other Topics Concern   Not on file  Social History Narrative   Married, 1 son.   Lives in [place].   Product/process development scientist -self-employed   No tob, no alc, no drugs.      Social Determinants of Health   Financial Resource Strain: Low Risk  (05/21/2022)   Overall Financial Resource Strain (CARDIA)    Difficulty of Paying Living Expenses: Not hard at all  Food Insecurity: No Food Insecurity (05/21/2022)   Hunger Vital Sign    Worried About Running Out of Food in the Last Year: Never true    Ran Out of Food in the Last Year: Never true  Transportation Needs: No Transportation Needs (05/21/2022)   PRAPARE - Hydrologist (Medical): No    Lack of Transportation (Non-Medical): No  Physical Activity: Sufficiently Active (05/21/2022)   Exercise Vital Sign    Days of Exercise per Week: 6 days    Minutes of Exercise per Session: 60 min  Stress: No Stress Concern Present (05/21/2022)   [place]    Feeling of Stress : Not at all  Social Connections: Moderately Integrated (05/21/2022)   Social Connection and Isolation Panel [NHANES]    Frequency of Communication with Friends and Family: More than three times a week    Frequency of Social Gatherings with Friends and Family: More than three times a week     Attends Religious Services: 1 to 4 times per year    Active Member of [employer] or Organizations: No    Attends Music therapist: Never    Marital Status: Married    Tobacco Counseling Counseling given: Not Answered   Clinical Intake:  Pre-visit preparation completed: Yes  Pain : No/denies pain     Diabetes: No  How often do you need to have someone help you when you read instructions, pamphlets, or other written materials from your doctor or pharmacy?: 1 - Never  Diabetic?no  Interpreter Needed?: No  Information entered by :: Charlott Rakes, LPN   Activities of Daily Living    05/21/2022   11:38 AM 05/30/2021    1:25 PM  In your present state of health, do you have any difficulty performing the following activities:  Hearing? 0 0  Vision? 0 0  Difficulty concentrating or making decisions? 0 0  Walking or climbing stairs? 0 0  Dressing or bathing? 0 0  Doing errands, shopping? 0 0  Preparing Food and eating ? N N  Using the Toilet? N N  In the past six months, have you accidently leaked urine? N N  Do you have problems with loss of bowel control? N N  Managing your Medications? N N  Managing your Finances? N N  Housekeeping or managing your Housekeeping? N N    Patient Care Team: Tammi Sou, MD as PCP - General (Family Medicine) Leonie Man, MD as PCP - Cardiology (Cardiology) Irene Shipper, MD as Consulting Physician (Gastroenterology) Marshell Garfinkel, MD as Consulting Physician (Pulmonary Disease) Gardiner Barefoot, DPM as Consulting Physician (Podiatry) Carolan Clines, MD (Inactive) as Consulting Physician (Urology) Garald Balding, MD as Consulting Physician (Orthopedic Surgery) Gloriann Loan,  Desiree Hane, MD as Consulting Physician (Urology) Grace Isaac, MD (Inactive) as Consulting Physician (Cardiothoracic Surgery) [place], Tami Lin, [place] as Physician Assistant (Cardiology) Debara Pickett Nadean Corwin, MD as Consulting Physician  (Cardiology)  Indicate any recent Medical Services you may have received from other than [facility] providers in the past year (date may be approximate).     Assessment:   This is a routine wellness examination for Tyler [name].  Hearing/Vision screen Hearing Screening - Comments:: Pt denies any hearing issues  Vision Screening - Comments:: Pt follows up with [facility]   Dietary issues and exercise activities discussed: Current Exercise Habits: Home exercise routine, Type of exercise: walking;Other - see comments, Time (Minutes): 60, Frequency (Times/Week): 6, Weekly Exercise (Minutes/Week): 360   Goals Addressed             This Visit's Progress    Patient Stated       Maintain health        Depression Screen    05/21/2022   11:35 AM 07/22/2021   10:06 AM 05/30/2021    1:32 PM 07/20/2020    8:08 AM 07/18/2019    8:37 AM 07/14/2018    8:34 AM 07/08/2017    8:24 AM  PHQ 2/9 Scores  PHQ - 2 Score 0 0 0 0 0 0 0    Fall Risk    05/21/2022   11:38 AM 07/22/2021   10:06 AM 05/30/2021    1:23 PM 07/20/2020    8:07 AM 07/18/2019    8:37 AM  Fall Risk   Falls in the past year? 0 0 0 0 0  Number falls in past yr: 0 0 0 0 0  Injury with Fall? 0 0 0 0 0  Risk for fall due to : No Fall Risks;Impaired vision      Follow up Falls prevention discussed Falls evaluation completed Falls evaluation completed;Falls prevention discussed Falls evaluation completed Falls evaluation completed    FALL RISK PREVENTION PERTAINING TO THE HOME:  Any stairs in or around the home? Yes  If so, are there any without handrails? No  Home free of loose throw rugs in walkways, pet beds, electrical cords, etc? Yes  Adequate lighting in your home to reduce risk of falls? Yes   ASSISTIVE DEVICES UTILIZED TO PREVENT FALLS:  Life alert? No  Use of a cane, walker or w/c? No  Grab bars in the bathroom? Yes  Shower chair or bench in shower? Yes  Elevated toilet seat or a handicapped toilet? No    TIMED UP AND GO:  Was the test performed? No .   Cognitive Function:        05/21/2022   11:43 AM  6CIT Screen  What Year? 0 points  What month? 0 points  What time? 0 points  Count back from 20 0 points  Months in reverse 0 points  Repeat phrase 0 points  Total Score 0 points    Immunizations Immunization History  Administered Date(s) Administered   Fluad Quad(high Dose 65+) 06/03/2019, 07/20/2020, 06/20/2021   Influenza Split 07/09/2011, 06/14/2012   Influenza Whole 07/09/2007, 07/05/2010   Influenza, High Dose Seasonal PF 06/22/2018   Influenza,inj,Quad PF,6+ Mos 06/14/2013, 06/19/2014, 06/20/2015, 07/04/2016, 06/25/2017   PFIZER(Purple Top)SARS-COV-2 Vaccination 09/28/2019, 10/19/2019, 06/16/2020   Pneumococcal Conjugate-13 07/18/2019   Pneumococcal Polysaccharide-23 07/14/2018   Td 10/18/2010   Tdap 08/07/2021   Zoster Recombinat (Shingrix) 09/13/2018, 03/08/2019   Zoster, Live 06/22/2013    TDAP status: Up to date  Flu Vaccine status: Up to date  Pneumococcal vaccine status: Up to date  Covid-19 vaccine status: Completed vaccines  Qualifies for Shingles Vaccine? Yes   Zostavax completed Yes   Shingrix Completed?: Yes  Screening Tests Health Maintenance  Topic Date Due   COVID-19 Vaccine (4 - Pfizer series) 08/11/2020   INFLUENZA VACCINE  04/08/2022   TETANUS/TDAP  08/08/2031   COLONOSCOPY (Pts 45-81yr Insurance coverage will need to be confirmed)  05/06/2032   Pneumonia Vaccine 69 Years old  Completed   Hepatitis C Screening  Completed   Zoster Vaccines- Shingrix  Completed   HPV VACCINES  Aged Out    Health Maintenance  Health Maintenance Due  Topic Date Due   COVID-19 Vaccine (4 - Pfizer series) 08/11/2020   INFLUENZA VACCINE  04/08/2022    Colorectal cancer screening: Type of screening: Colonoscopy. Completed 05/06/22. Repeat every 10 years   Additional Screening:  Hepatitis C Screening:  Completed 07/09/16  Vision Screening:  Recommended annual ophthalmology exams for early detection of glaucoma and other disorders of the eye. Is the patient up to date with their annual eye exam?  Yes  Who is the provider or what is the name of the office in which the patient attends annual eye exams? [facility]  If pt is not established with a provider, would they like to be referred to a provider to establish care? No .   Dental Screening: Recommended annual dental exams for proper oral hygiene  Community Resource Referral / Chronic Care Management: CRR required this visit?  No   CCM required this visit?  No      Plan:     I have personally reviewed and noted the following in the patient's chart:   Medical and social history Use of alcohol, tobacco or illicit drugs  Current medications and supplements including opioid prescriptions. Patient is not currently taking opioid prescriptions. Functional ability and status Nutritional status Physical activity Advanced directives List of other physicians Hospitalizations, surgeries, and ER visits in previous 12 months Vitals Screenings to include cognitive, depression, and falls Referrals and appointments  In addition, I have reviewed and discussed with patient certain preventive protocols, quality metrics, and best practice recommendations. A written personalized care plan for preventive services as well as general preventive health recommendations were provided to patient.     TWillette Brace LPN   [id number]  Nurse Notes: none

## 2022-06-23 ENCOUNTER — Ambulatory Visit (INDEPENDENT_AMBULATORY_CARE_PROVIDER_SITE_OTHER): Payer: Medicare HMO

## 2022-06-23 DIAGNOSIS — Z23 Encounter for immunization: Secondary | ICD-10-CM | POA: Diagnosis not present

## 2022-07-21 DIAGNOSIS — H9191 Unspecified hearing loss, right ear: Secondary | ICD-10-CM | POA: Insufficient documentation

## 2022-07-22 NOTE — Patient Instructions (Signed)
Health Maintenance, Male Adopting a healthy lifestyle and getting preventive care are important in promoting health and wellness. Ask your health care provider about: The right schedule for you to have regular tests and exams. Things you can do on your own to prevent diseases and keep yourself healthy. What should I know about diet, weight, and exercise? Eat a healthy diet  Eat a diet that includes plenty of vegetables, fruits, low-fat dairy products, and lean protein. Do not eat a lot of foods that are high in solid fats, added sugars, or sodium. Maintain a healthy weight Body mass index (BMI) is a measurement that can be used to identify possible weight problems. It estimates body fat based on height and weight. Your health care provider can help determine your BMI and help you achieve or maintain a healthy weight. Get regular exercise Get regular exercise. This is one of the most important things you can do for your health. Most adults should: Exercise for at least 150 minutes each week. The exercise should increase your heart rate and make you sweat (moderate-intensity exercise). Do strengthening exercises at least twice a week. This is in addition to the moderate-intensity exercise. Spend less time sitting. Even light physical activity can be beneficial. Watch cholesterol and blood lipids Have your blood tested for lipids and cholesterol at 69 years of age, then have this test every 5 years. You may need to have your cholesterol levels checked more often if: Your lipid or cholesterol levels are high. You are older than 69 years of age. You are at high risk for heart disease. What should I know about cancer screening? Many types of cancers can be detected early and may often be prevented. Depending on your health history and family history, you may need to have cancer screening at various ages. This may include screening for: Colorectal cancer. Prostate cancer. Skin cancer. Lung  cancer. What should I know about heart disease, diabetes, and high blood pressure? Blood pressure and heart disease High blood pressure causes heart disease and increases the risk of stroke. This is more likely to develop in people who have high blood pressure readings or are overweight. Talk with your health care provider about your target blood pressure readings. Have your blood pressure checked: Every 3-5 years if you are 18-39 years of age. Every year if you are 40 years old or older. If you are between the ages of 65 and 75 and are a current or former smoker, ask your health care provider if you should have a one-time screening for abdominal aortic aneurysm (AAA). Diabetes Have regular diabetes screenings. This checks your fasting blood sugar level. Have the screening done: Once every three years after age 45 if you are at a normal weight and have a low risk for diabetes. More often and at a younger age if you are overweight or have a high risk for diabetes. What should I know about preventing infection? Hepatitis B If you have a higher risk for hepatitis B, you should be screened for this virus. Talk with your health care provider to find out if you are at risk for hepatitis B infection. Hepatitis C Blood testing is recommended for: Everyone born from 1945 through 1965. Anyone with known risk factors for hepatitis C. Sexually transmitted infections (STIs) You should be screened each year for STIs, including gonorrhea and chlamydia, if: You are sexually active and are younger than 69 years of age. You are older than 69 years of age and your   health care provider tells you that you are at risk for this type of infection. Your sexual activity has changed since you were last screened, and you are at increased risk for chlamydia or gonorrhea. Ask your health care provider if you are at risk. Ask your health care provider about whether you are at high risk for HIV. Your health care provider  may recommend a prescription medicine to help prevent HIV infection. If you choose to take medicine to prevent HIV, you should first get tested for HIV. You should then be tested every 3 months for as long as you are taking the medicine. Follow these instructions at home: Alcohol use Do not drink alcohol if your health care provider tells you not to drink. If you drink alcohol: Limit how much you have to 0-2 drinks a day. Know how much alcohol is in your drink. In the [place] one drink equals one 12 oz bottle of beer (355 mL), one 5 oz glass of wine (148 mL), or one 1 oz glass of hard liquor (44 mL). Lifestyle Do not use any products that contain nicotine or tobacco. These products include cigarettes, chewing tobacco, and vaping devices, such as e-cigarettes. If you need help quitting, ask your health care provider. Do not use street drugs. Do not share needles. Ask your health care provider for help if you need support or information about quitting drugs. General instructions Schedule regular health, dental, and eye exams. Stay current with your vaccines. Tell your health care provider if: You often feel depressed. You have ever been abused or do not feel safe at home. Summary Adopting a healthy lifestyle and getting preventive care are important in promoting health and wellness. Follow your health care provider's instructions about healthy diet, exercising, and getting tested or screened for diseases. Follow your health care provider's instructions on monitoring your cholesterol and blood pressure. This information is not intended to replace advice given to you by your health care provider. Make sure you discuss any questions you have with your health care provider. Document Revised: 01/14/2021 Document Reviewed: 01/14/2021 Elsevier Patient Education  2023 Elsevier Inc.  

## 2022-07-23 ENCOUNTER — Encounter (HOSPITAL_BASED_OUTPATIENT_CLINIC_OR_DEPARTMENT_OTHER): Payer: Self-pay

## 2022-07-23 ENCOUNTER — Encounter: Payer: Self-pay | Admitting: Family Medicine

## 2022-07-23 ENCOUNTER — Ambulatory Visit (INDEPENDENT_AMBULATORY_CARE_PROVIDER_SITE_OTHER): Payer: Medicare HMO | Admitting: Family Medicine

## 2022-07-23 ENCOUNTER — Telehealth: Payer: Self-pay | Admitting: Internal Medicine

## 2022-07-23 ENCOUNTER — Other Ambulatory Visit: Payer: Self-pay

## 2022-07-23 VITALS — BP 105/65 | HR 63 | Temp 98.0°F | Ht 70.0 in | Wt 229.0 lb

## 2022-07-23 DIAGNOSIS — H9120 Sudden idiopathic hearing loss, unspecified ear: Secondary | ICD-10-CM | POA: Diagnosis not present

## 2022-07-23 DIAGNOSIS — E7800 Pure hypercholesterolemia, unspecified: Secondary | ICD-10-CM | POA: Diagnosis not present

## 2022-07-23 DIAGNOSIS — E119 Type 2 diabetes mellitus without complications: Secondary | ICD-10-CM

## 2022-07-23 DIAGNOSIS — Z0000 Encounter for general adult medical examination without abnormal findings: Secondary | ICD-10-CM

## 2022-07-23 DIAGNOSIS — Z0001 Encounter for general adult medical examination with abnormal findings: Secondary | ICD-10-CM | POA: Diagnosis not present

## 2022-07-23 DIAGNOSIS — Z125 Encounter for screening for malignant neoplasm of prostate: Secondary | ICD-10-CM

## 2022-07-23 DIAGNOSIS — R7303 Prediabetes: Secondary | ICD-10-CM | POA: Diagnosis not present

## 2022-07-23 DIAGNOSIS — Z7982 Long term (current) use of aspirin: Secondary | ICD-10-CM | POA: Diagnosis not present

## 2022-07-23 DIAGNOSIS — R42 Dizziness and giddiness: Secondary | ICD-10-CM | POA: Diagnosis not present

## 2022-07-23 DIAGNOSIS — I2510 Atherosclerotic heart disease of native coronary artery without angina pectoris: Secondary | ICD-10-CM

## 2022-07-23 DIAGNOSIS — R739 Hyperglycemia, unspecified: Secondary | ICD-10-CM | POA: Insufficient documentation

## 2022-07-23 LAB — CBC WITH DIFFERENTIAL/PLATELET
Abs Immature Granulocytes: 0.02 10*3/uL (ref 0.00–0.07)
Basophils Absolute: 0 10*3/uL (ref 0.0–0.1)
Basophils Relative: 0 %
Eosinophils Absolute: 0 10*3/uL (ref 0.0–0.5)
Eosinophils Relative: 1 %
HCT: 43.4 % (ref 39.0–52.0)
Hemoglobin: 14.8 g/dL (ref 13.0–17.0)
Immature Granulocytes: 0 %
Lymphocytes Relative: 13 %
Lymphs Abs: 1 10*3/uL (ref 0.7–4.0)
MCH: 31.1 pg (ref 26.0–34.0)
MCHC: 34.1 g/dL (ref 30.0–36.0)
MCV: 91.2 fL (ref 80.0–100.0)
Monocytes Absolute: 0.2 10*3/uL (ref 0.1–1.0)
Monocytes Relative: 3 %
Neutro Abs: 6.7 10*3/uL (ref 1.7–7.7)
Neutrophils Relative %: 83 %
Platelets: 189 10*3/uL (ref 150–400)
RBC: 4.76 MIL/uL (ref 4.22–5.81)
RDW: 12.5 % (ref 11.5–15.5)
WBC: 8 10*3/uL (ref 4.0–10.5)
nRBC: 0 % (ref 0.0–0.2)

## 2022-07-23 LAB — COMPREHENSIVE METABOLIC PANEL
ALT: 32 U/L (ref 0–53)
AST: 24 U/L (ref 0–37)
Albumin: 4.2 g/dL (ref 3.5–5.2)
Alkaline Phosphatase: 63 U/L (ref 39–117)
BUN: 16 mg/dL (ref 6–23)
CO2: 27 mEq/L (ref 19–32)
Calcium: 9.1 mg/dL (ref 8.4–10.5)
Chloride: 108 mEq/L (ref 96–112)
Creatinine, Ser: 0.88 mg/dL (ref 0.40–1.50)
GFR: 87.94 mL/min (ref >60.00)
Glucose, Bld: 101 mg/dL — ABNORMAL HIGH (ref 70–99)
Potassium: 4.3 mEq/L (ref 3.5–5.1)
Sodium: 140 mEq/L (ref 135–145)
Total Bilirubin: 0.6 mg/dL (ref 0.2–1.2)
Total Protein: 6.5 g/dL (ref 6.0–8.3)

## 2022-07-23 LAB — BASIC METABOLIC PANEL
Anion gap: 11 (ref 5–15)
BUN: 19 mg/dL (ref 8–23)
CO2: 23 mmol/L (ref 22–32)
Calcium: 9.6 mg/dL (ref 8.9–10.3)
Chloride: 105 mmol/L (ref 98–111)
Creatinine, Ser: 0.81 mg/dL (ref 0.61–1.24)
GFR, Estimated: >60 mL/min (ref >60)
Glucose, Bld: 140 mg/dL — ABNORMAL HIGH (ref 70–99)
Potassium: 4 mmol/L (ref 3.5–5.1)
Sodium: 139 mmol/L (ref 135–145)

## 2022-07-23 LAB — LIPID PANEL
Cholesterol: 113 mg/dL (ref 0–200)
HDL: 45.4 mg/dL (ref >39.00)
LDL Cholesterol: 44 mg/dL (ref 0–99)
NonHDL: 67.42
Total CHOL/HDL Ratio: 2
Triglycerides: 119 mg/dL (ref 0.0–149.0)
VLDL: 23.8 mg/dL (ref 0.0–40.0)

## 2022-07-23 LAB — HEMOGLOBIN A1C: Hgb A1c MFr Bld: 6.2 % (ref 4.6–6.5)

## 2022-07-23 LAB — CBC
HCT: 44.3 % (ref 39.0–52.0)
Hemoglobin: 15 g/dL (ref 13.0–17.0)
MCHC: 33.9 g/dL (ref 30.0–36.0)
MCV: 93.3 fl (ref 78.0–100.0)
Platelets: 182 10*3/uL (ref 150.0–400.0)
RBC: 4.75 Mil/uL (ref 4.22–5.81)
RDW: 12.9 % (ref 11.5–15.5)
WBC: 6.1 10*3/uL (ref 4.0–10.5)

## 2022-07-23 LAB — TSH: TSH: 1.87 u[IU]/mL (ref 0.35–5.50)

## 2022-07-23 LAB — PSA, MEDICARE: PSA: 2.46 ng/ml (ref 0.10–4.00)

## 2022-07-23 MED ORDER — PREDNISONE 20 MG PO TABS: ORAL_TABLET | ORAL | 0 refills | Status: DC

## 2022-07-23 NOTE — Telephone Encounter (Signed)
Received a call from Access nurse - Ron's wife asked to talk directly to a doctor.   He was in for a CPE today - had near onset hearing loss and a feeling of fluid in the ear.  Prescribed prednisone - has not been able to take it.    At 3pm he started with N/V and dizziness.  He has vomited x 4.    She is not sure if she can get him to the ED.  She wanted to give him zofran and meclizine that she used in the past.  Ok to try zofran and then meclizine.   If no improvement then she will take him to the ED.

## 2022-07-23 NOTE — Progress Notes (Signed)
Office Note 07/23/2022  CC:  Chief Complaint  Patient presents with   Annual Exam    Pt is fasting    HPI:  Patient is a 71 y.o. male who is here for annual health maintenance exam and f/u CAD, HLD, prediabetes. He has a PMH sx for STEMI and CABG (12/2018), with preserved LV fxn.  Lipid clinic manages his lipid medications.  2 days ago felt sudden sensation of his left ear being full or watery. Nothing was draining.  No pain.  This resolved pretty quickly and then the day after this his right ear hearing went completely out. No recent excessive noise exposure. No recent new medications. No vertigo or tinnitus.  Past Medical History:  Diagnosis Date   Allergy    Arthritis    BPH (benign prostatic hypertrophy)    Flomax not helpful 2018/19 per pt report   Chronic pain of right knee    Left knee as well (osteoarthritis).  Dr. Durward Fortes   Colon polyps 2013   NON-adenomatous 2013---recall 10 yrs.   Elevated PSA 07/10/2016   Dr. Gaynelle Arabian saw him 07/15/16, did f/u PSA and it was 2.78: f/u was recommended but pt did not do this as of 07/2017.  Repeat PSA here 07/2017 was 3.1, with 19% free (free a little low).  Pt then followed up with Dr. Gaynelle Arabian and tx for BPH w/plan of repeat PSA 1 yr.  PSA 07/2018 down to 3.21.   Erectile dysfunction    Urol rx'd sildenafil '20mg'$  tabs 11/2017.   Fatigue    + excessive daytime somnolence   GERD (gastroesophageal reflux disease)    Hallux limitus 04/2016   1st MPJ joint R foot.  Diclofenac helpful.  Injected by podiatrist 11/2016.   Hyperlipidemia    Dr. Debara Pickett   Hypogonadism male    Left main coronary artery disease 12/07/2018   NSTEMI:  ost LM ulcerated 75%, pCx 90%, EF 35-45% --> CABG x2 (LIMA-LAD, SVG-Cx) 12/14/2018.  LV function normal on Intra-Op TEE   NSTEMI (non-ST elevated myocardial infarction) ([place]) 12/2018   Critical left main and proximal circumflex disease--CABG x2 (Dr. Servando Snare.  LIMA-LAD, SVG-LCx)   Postoperative atrial  fibrillation ([place]) 12/2018   Converted on amiodarone; amio likely to be only short term   Prediabetes    A1c 5.8% 2014  A1c 6.1% Nov 2021. A1c 6.4% 07/2021   Sleep apnea     Past Surgical History:  Procedure Laterality Date   COLONOSCOPY  1998 & 2013   Dr [name]; 2 benign polyps 2013.  04/2022 NO POLYPS->no further screening colonoscopies needed   CORONARY ARTERY BYPASS GRAFT N/A 12/08/2018   Procedure: CORONARY ARTERY BYPASS GRAFTING (CABG) x 2, SVG TO  OM1, LIMA TO LAD, USING LEFT INTERNAL MAMMARY ARTERY AND RIGHT GREATER SAPHENOUS VEIN HARVESTED ENDOSCOPICALLY;  Surgeon: Grace Isaac, MD;  Location: [facility];  Service: Open Heart Surgery;  Laterality: N/A;   hydrocoelectomy     LEFT HEART CATH AND CORONARY ANGIOGRAPHY N/A 12/07/2018   Procedure: LEFT HEART CATH AND CORONARY ANGIOGRAPHY;  Surgeon: Jettie Booze, MD;  Location: [facility];; Ost LM 75%-ulcerated, pCx 90%, EF 35-45% --> referred for CABG   nocturnal polysomn  1990s   Sleep lab: no OSA (??)--Dr. [name] said he needs another sleep study as of 2013   [place]     TEE WITHOUT CARDIOVERSION N/A 12/08/2018   Procedure: TRANSESOPHAGEAL ECHOCARDIOGRAM (TEE);  Surgeon: Grace Isaac, MD;  Location: [facility];  Service:  Open Heart Surgery;  Laterality: N/A;   TONSILLECTOMY AND ADENOIDECTOMY     TRANSTHORACIC ECHOCARDIOGRAM  12/07/2018   In setting of non-STEMI: EF 55-60%.  GR 1 DD.  Mild inferior-inferolateral and apical lateral HK.  Normal RV.  Normal valves   UPPER GI ENDOSCOPY  2000 & 2011   dilation 2000    Family History  Problem Relation Age of Onset   Cancer Mother        Gynecologic   COPD Mother    Heart attack Father 27        CHF   Heart attack Maternal Grandfather        > 60   Heart attack Paternal Grandfather        >55   Colon cancer Neg Hx    Stomach cancer Neg Hx    Diabetes Neg Hx    Stroke Neg Hx    Colon polyps Neg Hx    Esophageal cancer Neg Hx    Rectal cancer  Neg Hx     Social History   Socioeconomic History   Marital status: Married    Spouse name: Not on file   Number of children: 1   Years of education: Not on file   Highest education level: Not on file  Occupational History   Not on file  Tobacco Use   Smoking status: Never   Smokeless tobacco: Never  Vaping Use   Vaping Use: Never used  Substance and Sexual Activity   Alcohol use: Not Currently    Alcohol/week: 1.0 standard drink of alcohol    Types: 1 Cans of beer per week    Comment:  2 beers/ month   Drug use: No   Sexual activity: Yes  Other Topics Concern   Not on file  Social History Narrative   Married, 1 son.   Lives in [place].   Product/process development scientist -self-employed   No tob, no alc, no drugs.      Social Determinants of Health   Financial Resource Strain: Low Risk  (05/21/2022)   Overall Financial Resource Strain (CARDIA)    Difficulty of Paying Living Expenses: Not hard at all  Food Insecurity: No Food Insecurity (05/21/2022)   Hunger Vital Sign    Worried About Running Out of Food in the Last Year: Never true    Ran Out of Food in the Last Year: Never true  Transportation Needs: No Transportation Needs (05/21/2022)   PRAPARE - Hydrologist (Medical): No    Lack of Transportation (Non-Medical): No  Physical Activity: Sufficiently Active (05/21/2022)   Exercise Vital Sign    Days of Exercise per Week: 6 days    Minutes of Exercise per Session: 60 min  Stress: No Stress Concern Present (05/21/2022)   [place]    Feeling of Stress : Not at all  Social Connections: Moderately Integrated (05/21/2022)   Social Connection and Isolation Panel [NHANES]    Frequency of Communication with Friends and Family: More than three times a week    Frequency of Social Gatherings with Friends and Family: More than three times a week    Attends Religious Services: 1 to  4 times per year    Active Member of [employer] or Organizations: No    Attends Archivist Meetings: Never    Marital Status: Married  Human resources officer Violence: Not At Risk (05/21/2022)   Humiliation, Afraid, Rape, and Kick questionnaire  Fear of Current or Ex-Partner: No    Emotionally Abused: No    Physically Abused: No    Sexually Abused: No    Outpatient Medications Prior to Visit  Medication Sig Dispense Refill   aspirin EC 81 MG tablet Take 81 mg by mouth daily.     ezetimibe (ZETIA) 10 MG tablet Take 1 tablet (10 mg total) by mouth daily. 90 tablet 3   metoprolol tartrate (LOPRESSOR) 25 MG tablet TAKE 1 TABLET BY MOUTH TWICE A DAY 180 tablet 3   Multiple Vitamins-Minerals (MULTIVITAMIN MEN PO) Take by mouth daily.     omeprazole (PRILOSEC) 20 MG capsule Take 20 mg by mouth daily.     rosuvastatin (CRESTOR) 20 MG tablet Take 1 tablet (20 mg total) by mouth daily. (1 tablet daily Monday Wednesday Friday, 2 tablets('40mg'$ ) daily Tuesday, Thursday, Saturday, Sunday). 138 tablet 3   silodosin (RAPAFLO) 8 MG CAPS capsule Take 8 mg by mouth daily.     No facility-administered medications prior to visit.    Allergies  Allergen Reactions   Sulfonamide Derivatives     REACTION: rash Because of a history of documented adverse serious drug reaction;Medi Alert bracelet  is recommended    ROS Review of Systems  Constitutional:  Negative for appetite change, chills, fatigue and fever.  HENT:  Positive for hearing loss. Negative for congestion, dental problem, ear pain and sore throat.   Eyes:  Negative for discharge, redness and visual disturbance.  Respiratory:  Negative for cough, chest tightness, shortness of breath and wheezing.   Cardiovascular:  Negative for chest pain, palpitations and leg swelling.  Gastrointestinal:  Negative for abdominal pain, blood in stool, diarrhea, nausea and vomiting.  Genitourinary:  Negative for difficulty urinating, dysuria, flank pain,  frequency, hematuria and urgency.  Musculoskeletal:  Negative for arthralgias, back pain, joint swelling, myalgias and neck stiffness.  Skin:  Negative for pallor and rash.  Neurological:  Negative for dizziness, speech difficulty, weakness and headaches.  Hematological:  Negative for adenopathy. Does not bruise/bleed easily.  Psychiatric/Behavioral:  Negative for confusion and sleep disturbance. The patient is not nervous/anxious.     PE;    07/23/2022    8:41 AM 05/21/2022    1:57 PM 05/06/2022   10:31 AM  Vitals with BMI  Height '5\' 10"'$     Weight 229 lbs 229 lbs 1 oz   BMI 68.34 19.62   Systolic 229  798  Diastolic 65  63  Pulse 63  57     Gen: Alert, well appearing.  Patient is oriented to person, place, time, and situation. AFFECT: pleasant, lucid thought and speech. ENT: Ears: EACs clear, normal epithelium.  TMs with good light reflex and landmarks bilaterally.  Weber: lateralizes to L ("good ear").  Rinne shows A>B on left.  He cannot hear Rinne at all on R.  Eyes: no injection, icteris, swelling, or exudate.  EOMI, PERRLA. Nose: no drainage or turbinate edema/swelling.  No injection or focal lesion.  Mouth: lips without lesion/swelling.  Oral mucosa pink and moist.  Dentition intact and without obvious caries or gingival swelling.  Oropharynx without erythema, exudate, or swelling.  Neck: supple/nontender.  No LAD, mass, or TM.  Carotid pulses 2+ bilaterally, without bruits. CV: RRR, no m/r/g.   LUNGS: CTA bilat, nonlabored resps, good aeration in all lung fields. ABD: soft, NT, ND, BS normal.  No hepatospenomegaly or mass.  No bruits. EXT: no clubbing, cyanosis, or edema.  Musculoskeletal: no joint swelling, erythema, warmth, or  tenderness.  ROM of all joints intact. Skin - no sores or suspicious lesions or rashes or color changes  Pertinent labs:  Lab Results  Component Value Date   TSH 1.310 01/30/2022   Lab Results  Component Value Date   WBC 6.5 07/22/2021   HGB  15.2 07/22/2021   HCT 45.1 07/22/2021   MCV 91.9 07/22/2021   PLT 175.0 07/22/2021   Lab Results  Component Value Date   CREATININE 0.89 07/22/2021   BUN 17 07/22/2021   NA 139 07/22/2021   K 4.5 07/22/2021   CL 107 07/22/2021   CO2 24 07/22/2021   Lab Results  Component Value Date   ALT 19 01/30/2022   AST 19 01/30/2022   ALKPHOS 74 01/30/2022   BILITOT 0.3 01/30/2022   Lab Results  Component Value Date   CHOL 139 01/30/2022   Lab Results  Component Value Date   HDL 46 01/30/2022   Lab Results  Component Value Date   LDLCALC 71 01/30/2022   Lab Results  Component Value Date   TRIG 120 01/30/2022   Lab Results  Component Value Date   CHOLHDL 3.0 01/30/2022   Lab Results  Component Value Date   PSA 2.35 07/20/2020   PSA 2.13 07/18/2019   PSA 3.21 07/14/2018   Lab Results  Component Value Date   HGBA1C 6.4 07/22/2021   Audiogram today: Left ear required greater than 70 dB at all frequencies.  Right ear required 45 dB at all frequencies.  ASSESSMENT AND PLAN:   1) Health maintenance exam: Reviewed age and gender appropriate health maintenance issues (prudent diet, regular exercise, health risks of tobacco and excessive alcohol, use of seatbelts, fire alarms in home, use of sunscreen).  Also reviewed age and gender appropriate health screening as well as vaccine recommendations. Vaccines:  ALL UTD. Labs: cbc, cmet, flp, Hba1c (prediabetes) Prostate ca screening: hx elevated PSA-->PSA today. Colon ca screening: Normal colonoscopy 05/06/2022.  No further screening colonoscopies needed per GI.  2) sudden sensorineural hearing loss, right. Prednisone '40mg'$  qd x 5d. ENT referral ordered.  #3 hyperlipidemia.  Doing well on Zetia 10 mg a day and rosuvastatin 20 mg a day except taking 40 mg daily 4 days a week. Lipid panel today.  #4 prediabetes. Hemoglobin A1c and fasting glucose today.  An After Visit Summary was printed and given to the patient.  FOLLOW  UP:  Return in about 6 months (around 01/21/2023) for routine chronic illness f/u.  Signed:  Crissie Sickles, MD           07/23/2022

## 2022-07-23 NOTE — ED Triage Notes (Signed)
Pt seen by PCP for fullness in both ears with not being able to hear in right ear today. Symptoms started Monday. PCP stated he thinks patient has a nerve issue in his ear, referred to ENT, and prescribed prednisone which patient started today. Pt reports having vertigo with room spinning start prior to taking prednisone around 2:30pm and has intermittent episodes of emesis when vertigo has been bad since. While walking to triage room writer noted steady gait. Pt reports worsening vertigo when turning head.

## 2022-07-24 ENCOUNTER — Emergency Department (HOSPITAL_BASED_OUTPATIENT_CLINIC_OR_DEPARTMENT_OTHER)
Admission: EM | Admit: 2022-07-24 | Discharge: 2022-07-24 | Disposition: A | Discharge status: Home / Self Care | Payer: Medicare HMO | Attending: Emergency Medicine | Admitting: Emergency Medicine

## 2022-07-24 ENCOUNTER — Encounter: Payer: Self-pay | Admitting: Family Medicine

## 2022-07-24 DIAGNOSIS — R42 Dizziness and giddiness: Secondary | ICD-10-CM

## 2022-07-24 MED ORDER — ONDANSETRON 4 MG PO TBDP: 4.0000 mg | ORAL_TABLET | Freq: Three times a day (TID) | ORAL | 0 refills | Status: DC | PRN

## 2022-07-24 MED ORDER — ONDANSETRON 4 MG PO TBDP
8.0000 mg | ORAL_TABLET | Freq: Once | ORAL | Status: AC
  Administered 2022-07-24: 8 mg via ORAL
  Filled 2022-07-24: qty 2

## 2022-07-24 MED ORDER — PROMETHAZINE HCL 25 MG PO TABS: 25.0000 mg | ORAL_TABLET | Freq: Three times a day (TID) | ORAL | 1 refills | Status: DC | PRN

## 2022-07-24 NOTE — Telephone Encounter (Signed)
Please review and advise.

## 2022-07-24 NOTE — Addendum Note (Signed)
Addended by: Deveron Furlong D on: 07/24/2022 01:20 PM   Modules accepted: Orders

## 2022-07-24 NOTE — Discharge Instructions (Signed)
Your exam shows you have had an episode of vertigo, which causes a false sense of movement such as a spinning feeling or walls that seem to move.  Most vertigo is caused by a (usually temporary) problem in the inner ear. Rarely, the back part of the brain can cause vertigo (some mini-strokes/strokes), but it appears to be a low risk cause for you at this time. It is important to follow-up with your doctor however, to see if you need further testing.   Do not drive or participate in potentially dangerous activities requiring balance unless off meds (not drowsy) and the vertigo has resolved. Most of the time benign vertigo is much better after a few days. However, mild unsteadiness may last for up to 3 months in some patients. An MRI scan or other special tests to evaluate your hearing and balance may be needed if the vertigo does not improve or returns in the future.   RETURN IMMEDIATELY IF YOU HAVE ANY OF THE FOLLOWING (call 911): Increasing vertigo, earache, ear drainage, or loss of hearing.  Severe headache, blurred or double vision, or trouble walking.  Fainting or poorly responsive, extreme weakness, chest pain, or palpitations.  Fever, persistent vomiting, or dehydration.  Numbness, tingling, incoordination, or weakness of the limbs.  Change in speech, vision, swallowing, understanding, or other concerns.

## 2022-07-24 NOTE — Telephone Encounter (Signed)
Patient wife, Vaughan Basta [facility]) calling to let Dr. Anitra Lauth know that patient went [facility] last night, wait time was 12 hours, patient is unable to keep anything down, including Zofran. He also has some dizziness. Patient requesting something for nausea and dizziness.  [facility] [place]  Please call Linda's cell [phone]

## 2022-07-24 NOTE — Telephone Encounter (Signed)
It looks like he was prescribed Zofran.  Please confirm. If Zofran has not worked then please send in prescription for Phenergan generic 25 mg tabs, 1/2-1 tab every 6 hours as needed for nausea and dizziness, #20, RF x 1.

## 2022-07-24 NOTE — Telephone Encounter (Signed)
Pt's wife advised of recommendations. She inquired about referral, mychart message sent with contact information.

## 2022-07-24 NOTE — ED Provider Notes (Signed)
[facility] EMERGENCY DEPT Provider Note   CSN: [id number] Arrival date & time: 07/23/22  2025     History  Chief Complaint  Patient presents with   Dizziness    Tyler [name] is a 71 y.o. male.  HPI Patient presents with hearing changes and dizziness.  He reports about 2 days ago he started having fullness in his left ear and feeling that there is water in his ear.  No pain.  He also feels that he is having hearing difficulties in his right ear.  He was seen by his PCP for routine health exam, and was felt to have "nerve changes" in his hearing and was prescribed prednisone and referred to ENT   After leaving the PCP office, he has had episodes of dizziness which feel like the world spinning.  No syncope.  No headache or vision changes.  No arm or leg weakness.  No numbness.  No chest pain.  No falls.  He has had multiple episodes of vomiting.  He tried taking the prednisone his PCP prescribed but was unable to  He reports the dizziness seems to be worsened with movement  No previous history of CVA, but he does have history of CAD but is not on anticoagulation  Home Medications Prior to Admission medications   Medication Sig Start Date End Date Taking? Authorizing Provider  ondansetron (ZOFRAN-ODT) 4 MG disintegrating tablet Take 1 tablet (4 mg total) by mouth every 8 (eight) hours as needed for nausea or vomiting. 07/24/22  Yes Ripley Fraise, MD  aspirin EC 81 MG tablet Take 81 mg by mouth daily.    [provider]  ezetimibe (ZETIA) 10 MG tablet Take 1 tablet (10 mg total) by mouth daily. 02/05/22   [facility], Tami Lin, PA  metoprolol tartrate (LOPRESSOR) 25 MG tablet TAKE 1 TABLET BY MOUTH TWICE A DAY 04/08/22   Leonie Man, MD  Multiple Vitamins-Minerals (MULTIVITAMIN MEN PO) Take by mouth daily.    [provider]  omeprazole (PRILOSEC) 20 MG capsule Take 20 mg by mouth daily.    [provider]  predniSONE (DELTASONE) 20 MG  tablet 2 tabs po qd x 5d 07/23/22   McGowen, Adrian Blackwater, MD  rosuvastatin (CRESTOR) 20 MG tablet Take 1 tablet (20 mg total) by mouth daily. (1 tablet daily Monday Wednesday Friday, 2 tablets('40mg'$ ) daily Tuesday, Thursday, Saturday, Sunday). 03/17/22   Leonie Man, MD  silodosin (RAPAFLO) 8 MG CAPS capsule Take 8 mg by mouth daily. 01/30/22   [provider]      Allergies    Sulfonamide derivatives    Review of Systems   Review of Systems  Constitutional:  Negative for fever.  Eyes:  Negative for visual disturbance.  Cardiovascular:  Negative for chest pain.  Neurological:  Positive for dizziness. Negative for weakness and headaches.    Physical Exam Updated Vital Signs BP (!) 141/71   Pulse (!) 58   Temp 97.6 F (36.4 C) (Oral)   Resp 18   Ht 1.778 m ('5\' 10"'$ )   Wt 103.9 kg   SpO2 97%   BMI 32.86 kg/m  Physical Exam CONSTITUTIONAL: Well developed/well nourished HEAD: Normocephalic/atraumatic EYES: EOMI/PERRL, no vertical nystagmus,  no ptosis Horizontal unidrectional nystagmus noted ENMT: Mucous membranes moist NECK: supple no meningeal signs, no bruits CV: S1/S2 noted, no murmurs/rubs/gallops noted LUNGS: Lungs are clear to auscultation bilaterally, no apparent distress ABDOMEN: soft, nontender, no rebound or guarding GU:no cva tenderness NEURO:Awake/alert, face symmetric, no arm  or leg drift is noted Equal 5/5 strength with shoulder abduction, elbow flex/extension, wrist flex/extension in upper extremities and equal hand grips bilaterally Equal 5/5 strength with hip flexion,knee flex/extension, foot dorsi/plantar flexion Cranial nerves 3/4/5/6/03/16/09/11/12 tested and intact Gait normal without ataxia No past pointing Sensation to light touch intact in all extremities EXTREMITIES: pulses normal, full ROM SKIN: warm, color normal PSYCH: no abnormalities of mood noted  ED Results / Procedures / Treatments   Labs (all labs ordered are listed, but only  abnormal results are displayed) Labs Reviewed  BASIC METABOLIC PANEL - Abnormal; Notable for the following components:      Result Value   Glucose, Bld 140 (*)    All other components within normal limits  CBC WITH DIFFERENTIAL/PLATELET    EKG EKG Interpretation  Date/Time:  Wednesday July 23 2022 20:51:25 EST Ventricular Rate:  63 PR Interval:  178 QRS Duration: 96 QT Interval:  434 QTC Calculation: 444 R Axis:   40 Text Interpretation: Normal sinus rhythm Incomplete right bundle branch block Borderline ECG Confirmed by Ripley Fraise [phone]) on 07/24/2022 4:12:32 AM  Radiology No results found.  Procedures Procedures    Medications Ordered in ED Medications  ondansetron (ZOFRAN-ODT) disintegrating tablet 8 mg (8 mg Oral Given 07/24/22 0452)    ED Course/ Medical Decision Making/ A&P Clinical Course as of 07/24/22 0637  Thu Jul 24, 2022  0411 Glucose(!): 140 Mild hyperglycemia [DW]    Clinical Course User Index [DW] Ripley Fraise, MD                           Medical Decision Making Amount and/or Complexity of Data Reviewed Labs: ordered. Decision-making details documented in ED Course.  Risk Prescription drug management.   This patient presents to the ED for concern of dizziness, this involves an extensive number of treatment options, and is a complaint that carries with it a high risk of complications and morbidity.  The differential diagnosis includes but is not limited to CVA, intracranial hemorrhage, acute coronary syndrome, renal failure, urinary tract infection, electrolyte disturbance, pneumonia    Comorbidities that complicate the patient evaluation: Patient's presentation is complicated by their history of CAD   Additional history obtained: Records reviewed previous admission documents  Lab Tests: I Ordered, and personally interpreted labs.  The pertinent results include:  mild hyperglycemia   Medicines ordered and prescription drug  management: I ordered medication including zofran  for nausea  Reevaluation of the patient after these medicines showed that the patient    improved  Test Considered: I considered MRI imaging, the patient is at low risk for acute CVA, will defer at this time  Reevaluation: After the interventions noted above, I reevaluated the patient and found that they have :improved  Complexity of problems addressed: Patient's presentation is most consistent with  acute presentation with potential threat to life or bodily function  Disposition: After consideration of the diagnostic results and the patient's response to treatment,  I feel that the patent would benefit from discharge   .    Patient overall well-appearing.  He reported dizziness, but was already improving by time of my exam.  He had no focal neurodeficits and could ambulate.  He did have unidirectional nystagmus.  I suspect peripheral peripheral type vertigo. He request Zofran only Patient already has referral to ENT.  We discussed strict return precautions       Final Clinical Impression(s) / ED Diagnoses Final diagnoses:  Vertigo    Rx / DC Orders ED Discharge Orders          Ordered    ondansetron (ZOFRAN-ODT) 4 MG disintegrating tablet  Every 8 hours PRN        07/24/22 0550              Ripley Fraise, MD 07/24/22 [phone]

## 2022-07-24 NOTE — Telephone Encounter (Signed)
Thank you Dr. Quay Burow.  Britt, pls call him and see how he's doing.

## 2022-08-04 ENCOUNTER — Ambulatory Visit: Payer: Medicare HMO | Attending: Cardiology | Admitting: Cardiology

## 2022-08-04 ENCOUNTER — Encounter: Payer: Self-pay | Admitting: Cardiology

## 2022-08-04 VITALS — BP 117/60 | HR 60 | Ht 70.0 in | Wt 228.6 lb

## 2022-08-04 DIAGNOSIS — G4733 Obstructive sleep apnea (adult) (pediatric): Secondary | ICD-10-CM | POA: Diagnosis not present

## 2022-08-04 DIAGNOSIS — E785 Hyperlipidemia, unspecified: Secondary | ICD-10-CM

## 2022-08-04 DIAGNOSIS — R5382 Chronic fatigue, unspecified: Secondary | ICD-10-CM

## 2022-08-04 DIAGNOSIS — I2510 Atherosclerotic heart disease of native coronary artery without angina pectoris: Secondary | ICD-10-CM

## 2022-08-04 DIAGNOSIS — I214 Non-ST elevation (NSTEMI) myocardial infarction: Secondary | ICD-10-CM | POA: Diagnosis not present

## 2022-08-04 DIAGNOSIS — Z0000 Encounter for general adult medical examination without abnormal findings: Secondary | ICD-10-CM | POA: Diagnosis not present

## 2022-08-04 DIAGNOSIS — R7989 Other specified abnormal findings of blood chemistry: Secondary | ICD-10-CM

## 2022-08-04 DIAGNOSIS — Z951 Presence of aortocoronary bypass graft: Secondary | ICD-10-CM

## 2022-08-04 NOTE — Patient Instructions (Addendum)
Medication Instructions:   No changes  *If you need a refill on your cardiac medications before your next appointment, please call your pharmacy*   Lab Work: Not needed    Testing/Procedures: Not needed   Follow-Up: At [facility], you and your health needs are our priority.  As part of our continuing mission to provide you with exceptional heart care, we have created designated Provider Care Teams.  These Care Teams include your primary Cardiologist (physician) and Advanced Practice Providers (APPs -  Physician Assistants and Nurse Practitioners) who all work together to provide you with the care you need, when you need it.     Your next appointment:   6 month(s)  The format for your next appointment:   In Person  Provider:   Glenetta Hew, MD or Doreene Adas

## 2022-08-04 NOTE — Progress Notes (Signed)
Primary Care Provider: Tammi Sou, MD [place] Cardiologist: Glenetta Hew, MD Electrophysiologist: None  Clinic Note: Chief Complaint  Patient presents with   Follow-up    6 months.  Stable.  Doing well.   Coronary Artery Disease    No angina.   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  No further angina since CABG.  Plan: Continue aspirin. Okay to hold for 5 to 7 days preop for surgical procedures. Continue statin at reduced dose-20 mg rosuvastatin with the addition of Zetia. Follow-up labs shows stable well-controlled lipids with LDL 45.- Doing well without fatigue. He is still on twice daily Lopressor 25 mg-low threshold to convert to once daily dosing but he is doing well so we will continue current meds.   With well-controlled blood pressure, not on ARB. Plan follow-up surveillance Myoview in spring 2025      Relevant Orders   EKG 12-Lead (Completed)   NSTEMI (non-ST elevated myocardial infarction) (HCC) (Chronic)    Interestingly the wall motion really on echo in the setting of his non-STEMI was inferior lateral wall-consistent with LCx as the culprit.  Has not had a follow-up echocardiogram, but EF still preserved at 55 to 60%.  No reason to recheck. Stable without further symptoms -> angina or CHF.      Hyperlipidemia with target LDL less than 70 (Chronic)    Most recent labs look great on 10 mg Zetia +20 mg rosuvastatin.  Tolerating it well. No change.        Other   S/P CABG x 2 (Chronic)    Doing well postop.  Almost 4 years out from CABG.  Will be due for surveillance Myoview at the earliest April 2024, but if he is doing as well as he is, would like to do wait until spring-summer 2025.      Relevant Orders   EKG 12-Lead (Completed)    Obstructive sleep apnea (Chronic)    Encouraged to use CPAP more consistently..      Low serum testosterone level    He asked about low-dose supplementation.  Not unreasonable to consider, but there is data to suggest correlation between supplementation and adverse effects.  If he does supplement, would try to shoot for low end of normal range.  If not borderline low rates.      Health maintenance examination    He asked about whether or not he should get the COVID shot I think he should be fine to do so, but this is a somewhat of a philosophical conversation at this point.  From a strictly clinical standpoint would make sense to be vaccinated against potential harmful pathogens.      Chronic fatigue (Chronic)    Much improved with reduced dose of statin.  Seems to be tolerating the rosuvastatin 20 mg without as much problem. Would not want to titrate beta-blocker any further especially with the resting heart rate 60 bpm.       ===================================  HPI:    Tyler [name] is a 71 y.o. male with a PMH below who presents today for 19-month/ [facility]follow-up at the request of McGowen, PAdrian Blackwater MD.  CAD -> NSTEMI 11/2018 -  Cath with ulcerated Ost LM 75%, pCx 90%.  EF 35 to 45%=>  referred for CABG CABG x 2 12/08/2018: LIMA-LAD, SVG-Cx, Intra-op TEE EF 50-55%. (Dr. 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  Thought to be related to statin and metoprolol.  Was staying active.  Recommended 1 month statin holiday.  Had significant improvement in symptoms with statin holiday, decreased to 20 mg Crestor.  AYVEN PHEASANT was last seen on Feb 05, 2022 for 9-monthfollow-up by ALevada DyDuke-doing well.  Walking 2 miles 5 times a day without angina.  Stable joint pain since pre-CABG.  Plan for Myoview in 2025.  Able to  complete more than 4.0 METS.  No angina.  Started Zetia  Recent Hospitalizations:  ER visit 07/24/2022 for vertigo. = dizziness worse with movement. Feels like water filling his ear. => started on Prednisone by PCP.  Feel like world is spinning.  => Referred to ENT.  Reviewed  CV studies:    The following studies were reviewed today: (if available, images/films reviewed: From Epic Chart or Care Everywhere) N/a:  Interval History:   RAUGUSTO DECKMANreturns today overall doing very well.  He had an episode of dizziness a few weeks ago and is pending ENT evaluation and potentially neurology for his vertigo.  He has not had any actual falls.  He does say that his energy level is better since cutting down his statin dose.  Still able to do about 2 miles a day 5 days a week without any angina or heart dyspnea.  No rest dyspnea or chest pain.  No PND orthopnea or edema.  No myalgias or arthralgias.  No rapid irregular beats or palpitations.  No syncope or near syncope.  No TIA or amaurosis fugax.  No claudication.  REVIEWED OF SYSTEMS   Review of Systems  Constitutional:  Negative for malaise/fatigue (Doing much better) and weight loss.  HENT:  Negative for congestion.   Respiratory:  Negative for cough and shortness of breath.   Gastrointestinal:  Negative for blood in stool and melena.  Genitourinary:  Negative for hematuria.  Neurological:  Positive for dizziness (Dr. BCyndia Bentspinning.  Vertigo type symptoms.  No falling.). Negative for focal weakness and loss of consciousness (No syncope or near syncope).  Psychiatric/Behavioral: Negative.      I have reviewed and (if needed) personally updated the patient's problem list, medications, allergies, past medical and surgical history, social and family history.   PAST MEDICAL HISTORY   Past Medical History:  Diagnosis Date   Allergy    Arthritis    BPH (benign prostatic hypertrophy)    Flomax not helpful 2018/19 per pt report    Chronic pain of right knee    Left knee as well (osteoarthritis).  Dr. WDurward Fortes  Colon polyps 2013   NON-adenomatous 2013---recall 10 yrs.   Elevated PSA 07/10/2016   Dr. TGaynelle Arabiansaw him 07/15/16, did f/u PSA and it was 2.78: f/u was recommended but pt did not do this as of 07/2017.  Repeat PSA here 07/2017 was 3.1, with 19% free (free a little low).  Pt then followed up with Dr. TGaynelle Arabianand tx for BPH w/plan of repeat PSA 1 yr.  PSA 07/2018 down to 3.21.   Erectile dysfunction    Urol rx'd sildenafil '20mg'$  tabs 11/2017.   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  Diclofenac helpful.  Injected by podiatrist 11/2016.   Hyperlipidemia    Dr. Debara Pickett   Hypogonadism male    Left main coronary artery disease 12/07/2018   NSTEMI:  ost LM ulcerated 75%, pCx 90%, EF 35-45% --> CABG x2 (LIMA-LAD, SVG-Cx) 12/14/2018.  LV function normal on Intra-Op TEE   NSTEMI (non-ST elevated myocardial infarction) ([place]) 12/2018   Critical left main and proximal circumflex disease--CABG x2 (Dr. Servando Snare.  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 A1c 6.4% 07/2021. 6.2% Nov 2023   Sleep apnea     PAST SURGICAL HISTORY   Past Surgical History:  Procedure Laterality Date   COLONOSCOPY  1998 & 2013   Dr [name]; 2 benign polyps 2013.  04/2022 NO POLYPS->no further screening colonoscopies needed   CORONARY ARTERY BYPASS GRAFT N/A 12/08/2018   Procedure: CORONARY ARTERY BYPASS GRAFTING (CABG) x 2, SVG TO  OM1, LIMA TO LAD, USING LEFT INTERNAL MAMMARY ARTERY AND RIGHT GREATER SAPHENOUS VEIN HARVESTED ENDOSCOPICALLY;  Surgeon: Grace Isaac, MD;  Location: [facility];  Service: Open Heart Surgery;  Laterality: N/A;   hydrocoelectomy     LEFT HEART CATH AND CORONARY ANGIOGRAPHY N/A  12/07/2018   Procedure: LEFT HEART CATH AND CORONARY ANGIOGRAPHY;  Surgeon: Jettie Booze, MD;  Location: [facility];; Ost LM 75%-ulcerated, pCx 90%, EF 35-45% --> referred for CABG   nocturnal polysomn  1990s   Sleep lab: no OSA (??)--Dr. [name] said he needs another sleep study as of 2013   [place]     TEE WITHOUT CARDIOVERSION N/A 12/08/2018   Procedure: TRANSESOPHAGEAL ECHOCARDIOGRAM (TEE);  Surgeon: Grace Isaac, MD;  Location: [facility];  Service: Open Heart Surgery;  Laterality: N/A;   TONSILLECTOMY AND ADENOIDECTOMY     TRANSTHORACIC ECHOCARDIOGRAM  12/07/2018   In setting of non-STEMI: EF 55-60%.  GR 1 DD.  Mild inferior-inferolateral and apical lateral HK.  Normal RV.  Normal valves   UPPER GI ENDOSCOPY  2000 & 2011   dilation 2000    Immunization History  Administered Date(s) Administered   Fluad Quad(high Dose 65+) 06/03/2019, 07/20/2020, 06/20/2021, 06/23/2022   Influenza Split 07/09/2011, 06/14/2012   Influenza Whole 07/09/2007, 07/05/2010   Influenza, High Dose Seasonal PF 06/22/2018   Influenza,inj,Quad PF,6+ Mos 06/14/2013, 06/19/2014, 06/20/2015, 07/04/2016, 06/25/2017   PFIZER(Purple Top)SARS-COV-2 Vaccination 09/28/2019, 10/19/2019, 06/16/2020   Pneumococcal Conjugate-13 07/18/2019   Pneumococcal Polysaccharide-23 07/14/2018   Td 10/18/2010   Tdap 08/07/2021   Zoster Recombinat (Shingrix) 09/13/2018, 03/08/2019   Zoster, Live 06/22/2013    MEDICATIONS/ALLERGIES   Current Meds  Medication Sig   aspirin EC 81 MG tablet Take 81 mg by mouth daily.   ezetimibe (ZETIA) 10 MG tablet Take 1 tablet (10 mg total) by mouth daily.   metoprolol tartrate (LOPRESSOR) 25 MG tablet TAKE 1 TABLET BY MOUTH TWICE A DAY   Multiple Vitamins-Minerals (MULTIVITAMIN MEN PO) Take by mouth daily.   omeprazole (PRILOSEC) 20 MG capsule Take 20 mg by mouth daily.   ondansetron (ZOFRAN-ODT) 4 MG disintegrating tablet Take 1 tablet (4 mg total) by mouth  every 8 (eight) hours as needed for nausea or vomiting.   predniSONE (DELTASONE) 20 MG tablet 2 tabs po qd x 5d   promethazine (PHENERGAN) 25 MG tablet Take 1  tablet (25 mg total) by mouth every 8 (eight) hours as needed for nausea or vomiting.   rosuvastatin (CRESTOR) 20 MG tablet Take 1 tablet (20 mg total) by mouth daily. (1 tablet daily Monday Wednesday Friday, 2 tablets('40mg'$ ) daily Tuesday, Thursday, Saturday, Sunday).   silodosin (RAPAFLO) 8 MG CAPS capsule Take 8 mg by mouth daily.    Allergies  Allergen Reactions   Sulfonamide Derivatives     REACTION: rash Because of a history of documented adverse serious drug reaction;Medi Alert bracelet  is recommended    SOCIAL HISTORY/FAMILY HISTORY   Reviewed in Epic:  Pertinent findings:  Social History   Tobacco Use   Smoking status: Never   Smokeless tobacco: Never  Vaping Use   Vaping Use: Never used  Substance Use Topics   Alcohol use: Not Currently    Alcohol/week: 1.0 standard drink of alcohol    Types: 1 Cans of beer per week    Comment:  2 beers/ month   Drug use: No   Social History   Social History Narrative   Married, 1 son.   Lives in [place].   Product/process development scientist -self-employed   No tob, no alc, no drugs.       OBJCTIVE -PE, EKG, labs   Wt Readings from Last 3 Encounters:  08/04/22 228 lb 9.6 oz (103.7 kg)  07/23/22 229 lb (103.9 kg)  07/23/22 229 lb (103.9 kg)    Physical Exam: BP 117/60   Pulse 60   Ht '5\' 10"'$  (1.778 m)   Wt 228 lb 9.6 oz (103.7 kg)   SpO2 96%   BMI 32.80 kg/m  Physical Exam Constitutional:      General: He is not in acute distress.    Appearance: Normal appearance. He is obese. He is not ill-appearing or toxic-appearing.     Comments: Well-nourished, well-groomed.  Healthy-appearing.  HENT:     Head: Normocephalic and atraumatic.  Neck:     Vascular: No carotid bruit or JVD.  Cardiovascular:     Rate and Rhythm: Normal rate and regular rhythm. No  extrasystoles are present.    Chest Wall: PMI is not displaced.     Pulses: Normal pulses.     Heart sounds: S1 normal and S2 normal. Heart sounds are distant. No murmur heard.    No friction rub. No gallop.  Pulmonary:     Effort: Pulmonary effort is normal. No respiratory distress.     Breath sounds: Normal breath sounds. No wheezing, rhonchi or rales.  Musculoskeletal:        General: No swelling. Normal range of motion.     Cervical back: Normal range of motion and neck supple.  Skin:    General: Skin is warm and dry.  Neurological:     General: No focal deficit present.     Mental Status: He is alert and oriented to person, place, and time.  Psychiatric:        Mood and Affect: Mood normal.        Behavior: Behavior normal.        Thought Content: Thought content normal.        Judgment: Judgment normal.      Adult ECG Report  Rate: 60 ;  Rhythm: normal sinus rhythm and cannot rule out anterior MI, age-indeterminate. ;  Question lead placement with unusual R wave progression.  I suspected V3 and V1 are reversed.  Narrative Interpretation: Stable.  EKG from November 15 reviewed.  The RSR prime  in V1 and V2 suggest possible incomplete right bundle branch block.  Could simply just be conduction delay.  Not significant abnormality.  Recent Labs: Reviewed Lab Results  Component Value Date   CHOL 113 07/23/2022   HDL 45.40 07/23/2022   LDLCALC 44 07/23/2022   TRIG 119.0 07/23/2022   CHOLHDL 2 07/23/2022   Lab Results  Component Value Date   CREATININE 0.81 07/23/2022   BUN 19 07/23/2022   NA 139 07/23/2022   K 4.0 07/23/2022   CL 105 07/23/2022   CO2 23 07/23/2022      Latest Ref Rng & Units 07/23/2022    8:57 PM 07/23/2022    9:24 AM 07/22/2021   10:46 AM  CBC  WBC 4.0 - 10.5 K/uL 8.0  6.1  6.5   Hemoglobin 13.0 - 17.0 g/dL 14.8  15.0  15.2   Hematocrit 39.0 - 52.0 % 43.4  44.3  45.1   Platelets 150 - 400 K/uL 189  182.0  175.0     Lab Results  Component  Value Date   HGBA1C 6.2 07/23/2022   Lab Results  Component Value Date   TSH 1.87 07/23/2022    ================================================== I spent a total of 23 minutes with the patient spent in direct patient consultation.  Additional time spent with chart review  / charting (studies, outside notes, etc): 18 min => ER visit & notes reviewed along with prior clinic notes  Total Time: 41 min  Current medicines are reviewed at length with the patient today.  (+/- concerns) nA  Notice: This dictation was prepared with Dragon dictation along with smart phrase technology. Any transcriptional errors that result from this process are unintentional and may not be corrected upon review.  Studies Ordered:   Orders Placed This Encounter  Procedures   EKG 12-Lead   No orders of the defined types were placed in this encounter.   Patient Instructions / Medication Changes & Studies & Tests Ordered   Patient Instructions  Medication Instructions:   No changes  *If you need a refill on your cardiac medications before your next appointment, please call your pharmacy*   Lab Work: Not needed    Testing/Procedures: Not needed   Follow-Up: At [facility], you and your health needs are our priority.  As part of our continuing mission to provide you with exceptional heart care, we have created designated Provider Care Teams.  These Care Teams include your primary Cardiologist (physician) and Advanced Practice Providers (APPs -  Physician Assistants and Nurse Practitioners) who all work together to provide you with the care you need, when you need it.     Your next appointment:   6 month(s)  The format for your next appointment:   In Person  Provider:   Glenetta Hew, MD or Colman Cater, MD, MS Glenetta Hew, M.D., M.S. Interventional Cardiologist  [place]  Pager # [phone] Phone # [phone] [address] [place],  [address]   Thank you for choosing [facility]!!

## 2022-08-06 ENCOUNTER — Telehealth: Payer: Self-pay | Admitting: Family Medicine

## 2022-08-06 NOTE — Telephone Encounter (Signed)
Okay to change location?

## 2022-08-06 NOTE — Telephone Encounter (Signed)
Referral sent to Fax number:[phone]

## 2022-08-06 NOTE — Telephone Encounter (Signed)
Pt is calling to have the referral resent to [facility] E[facility] Dr. West Carbo. He is also needing the audiology test sent over to them as well. [facility] is saying they do not have the referral.   Thank you

## 2022-08-06 NOTE — Telephone Encounter (Signed)
Yes okay, pls order

## 2022-08-11 ENCOUNTER — Telehealth: Payer: Self-pay | Admitting: Family Medicine

## 2022-08-11 DIAGNOSIS — R42 Dizziness and giddiness: Secondary | ICD-10-CM

## 2022-08-11 NOTE — Telephone Encounter (Signed)
Good Morning, I got a call from ENT provider today stating provider has an appt on Thursday for Vertigo. I told her I sent  a referral on the 29th For     H91.20 (ICD-10-CM) - Sudden-onset sensorineural hearing loss  However she stated that the referral must say Vertigo as the DX. She is requesting  a new referral or the appt will be canceled.   Thanks

## 2022-08-11 NOTE — Telephone Encounter (Signed)
[facility] for new referral using dx vertigo?

## 2022-08-11 NOTE — Telephone Encounter (Signed)
New referral complete

## 2022-08-11 NOTE — Telephone Encounter (Signed)
Yes okay 

## 2022-08-12 NOTE — Telephone Encounter (Signed)
Done

## 2022-08-14 DIAGNOSIS — H8301 Labyrinthitis, right ear: Secondary | ICD-10-CM | POA: Diagnosis not present

## 2022-08-14 DIAGNOSIS — H9120 Sudden idiopathic hearing loss, unspecified ear: Secondary | ICD-10-CM | POA: Diagnosis not present

## 2022-08-15 ENCOUNTER — Telehealth: Payer: Self-pay | Admitting: Cardiology

## 2022-08-15 DIAGNOSIS — H903 Sensorineural hearing loss, bilateral: Secondary | ICD-10-CM | POA: Diagnosis not present

## 2022-08-15 NOTE — Telephone Encounter (Signed)
Pt c/o medication issue:  1. Name of Medication:  metoprolol tartrate (LOPRESSOR) 25 MG tablet   2. How are you currently taking this medication (dosage and times per day)? As prescribed  3. Are you having a reaction (difficulty breathing--STAT)?   No  4. What is your medication issue?   Patient stated he is being prescribed steroids and wants to know if this will affect his blood pressure when taken with this medication.

## 2022-08-15 NOTE — Telephone Encounter (Signed)
Prednisone doesn't interact with his metoprolol or other meds. He's being prescribed a low dose for the short term, should be ok to take. He may notice an increase blood pressure or fluid retention while on prednisone though.

## 2022-08-15 NOTE — Telephone Encounter (Signed)
Returned call to patient who states that he has recently got a virus that affected his hearing and states that he was started on Prednisone '10mg'$  daily for 14 days. Patient states that he would like to know if it is safe for him to do short term prednisone due to his heart and blood pressure and also make sure that it is safe to take closely with his metoprolol. Advised patient I would forward message over to PharmD/MD for review and advice. Patient verbalized understanding.

## 2022-08-15 NOTE — Telephone Encounter (Signed)
Returned call to patient and made him aware of PharmD's recommendations. Patient verbalized understanding.   Advised patient to call back to office with any issues, questions, or concerns. Patient verbalized understanding.

## 2022-08-24 ENCOUNTER — Encounter: Payer: Self-pay | Admitting: Cardiology

## 2022-08-24 NOTE — Assessment & Plan Note (Signed)
He asked about whether or not he should get the COVID shot I think he should be fine to do so, but this is a somewhat of a philosophical conversation at this point.  From a strictly clinical standpoint would make sense to be vaccinated against potential harmful pathogens.

## 2022-08-24 NOTE — Assessment & Plan Note (Signed)
Encouraged to use CPAP more consistently.[name]

## 2022-08-24 NOTE — Assessment & Plan Note (Signed)
Significant LM disease on Cath referred for CABG x2.  No further angina since CABG.  Plan: Continue aspirin. Okay to hold for 5 to 7 days preop for surgical procedures. Continue statin at reduced dose-20 mg rosuvastatin with the addition of Zetia. Follow-up labs shows stable well-controlled lipids with LDL 45.- Doing well without fatigue. He is still on twice daily Lopressor 25 mg-low threshold to convert to once daily dosing but he is doing well so we will continue current meds.   With well-controlled blood pressure, not on ARB. Plan follow-up surveillance Myoview in spring 2025

## 2022-08-24 NOTE — Assessment & Plan Note (Signed)
Much improved with reduced dose of statin.  Seems to be tolerating the rosuvastatin 20 mg without as much problem. Would not want to titrate beta-blocker any further especially with the resting heart rate 60 bpm.

## 2022-08-24 NOTE — Assessment & Plan Note (Signed)
Interestingly the wall motion really on echo in the setting of his non-STEMI was inferior lateral wall-consistent with LCx as the culprit.  Has not had a follow-up echocardiogram, but EF still preserved at 55 to 60%.  No reason to recheck. Stable without further symptoms -> angina or CHF.

## 2022-08-24 NOTE — Assessment & Plan Note (Signed)
Doing well postop.  Almost 4 years out from CABG.  Will be due for surveillance Myoview at the earliest April 2024, but if he is doing as well as he is, would like to do wait until spring-summer 2025.

## 2022-08-24 NOTE — Assessment & Plan Note (Signed)
He asked about low-dose supplementation.  Not unreasonable to consider, but there is data to suggest correlation between supplementation and adverse effects.  If he does supplement, would try to shoot for low end of normal range.  If not borderline low rates.

## 2022-08-24 NOTE — Assessment & Plan Note (Signed)
Most recent labs look great on 10 mg Zetia +20 mg rosuvastatin.  Tolerating it well. No change.

## 2022-09-24 ENCOUNTER — Encounter: Payer: Self-pay | Admitting: Otolaryngology

## 2022-09-24 ENCOUNTER — Other Ambulatory Visit: Payer: Self-pay | Admitting: Otolaryngology

## 2022-09-24 DIAGNOSIS — H9123 Sudden idiopathic hearing loss, bilateral: Secondary | ICD-10-CM

## 2022-09-24 DIAGNOSIS — H903 Sensorineural hearing loss, bilateral: Secondary | ICD-10-CM

## 2022-09-26 DIAGNOSIS — H903 Sensorineural hearing loss, bilateral: Secondary | ICD-10-CM | POA: Diagnosis not present

## 2022-09-26 DIAGNOSIS — H8301 Labyrinthitis, right ear: Secondary | ICD-10-CM | POA: Diagnosis not present

## 2022-10-10 ENCOUNTER — Ambulatory Visit
Admission: RE | Admit: 2022-10-10 | Discharge: 2022-10-10 | Disposition: A | Discharge status: Home / Self Care | Payer: Medicare HMO | Source: Ambulatory Visit | Attending: Otolaryngology | Admitting: Otolaryngology

## 2022-10-10 DIAGNOSIS — H903 Sensorineural hearing loss, bilateral: Secondary | ICD-10-CM

## 2022-10-10 DIAGNOSIS — H9123 Sudden idiopathic hearing loss, bilateral: Secondary | ICD-10-CM

## 2022-10-10 DIAGNOSIS — I6381 Other cerebral infarction due to occlusion or stenosis of small artery: Secondary | ICD-10-CM | POA: Diagnosis not present

## 2022-10-10 MED ORDER — GADOPICLENOL 0.5 MMOL/ML IV SOLN
10.0000 mL | Freq: Once | INTRAVENOUS | Status: AC | PRN
  Administered 2022-10-10: 10 mL via INTRAVENOUS

## 2023-01-13 DIAGNOSIS — H524 Presbyopia: Secondary | ICD-10-CM | POA: Diagnosis not present

## 2023-01-16 ENCOUNTER — Other Ambulatory Visit: Payer: Self-pay

## 2023-01-16 MED ORDER — EZETIMIBE 10 MG PO TABS: 10.0000 mg | ORAL_TABLET | Freq: Every day | ORAL | 0 refills | Status: DC

## 2023-01-19 NOTE — Patient Instructions (Signed)

## 2023-01-21 ENCOUNTER — Encounter: Payer: Self-pay | Admitting: Family Medicine

## 2023-01-21 ENCOUNTER — Ambulatory Visit (INDEPENDENT_AMBULATORY_CARE_PROVIDER_SITE_OTHER): Payer: Medicare HMO | Admitting: Family Medicine

## 2023-01-21 VITALS — BP 122/75 | HR 63 | Temp 97.8°F | Wt 231.4 lb

## 2023-01-21 DIAGNOSIS — E7800 Pure hypercholesterolemia, unspecified: Secondary | ICD-10-CM

## 2023-01-21 DIAGNOSIS — R7303 Prediabetes: Secondary | ICD-10-CM | POA: Diagnosis not present

## 2023-01-21 DIAGNOSIS — H9120 Sudden idiopathic hearing loss, unspecified ear: Secondary | ICD-10-CM

## 2023-01-21 DIAGNOSIS — I2510 Atherosclerotic heart disease of native coronary artery without angina pectoris: Secondary | ICD-10-CM | POA: Diagnosis not present

## 2023-01-21 LAB — POCT GLYCOSYLATED HEMOGLOBIN (HGB A1C)
HbA1c POC (<> result, manual entry): 5.5 % (ref 4.0–5.6)
HbA1c, POC (controlled diabetic range): 5.5 % (ref 0.0–7.0)
HbA1c, POC (prediabetic range): 5.5 % — AB (ref 5.7–6.4)
Hemoglobin A1C: 5.5 % (ref 4.0–5.6)

## 2023-01-21 NOTE — Progress Notes (Signed)
OFFICE VISIT  01/21/2023  CC:  Chief Complaint  Patient presents with   Medical Management of Chronic Issues    Pt is not fasting.     Patient is a 72 y.o. male who presents for 58-month follow-up prediabetes, hyperlipidemia, and coronary artery disease. A/P as of last visit: "1) sudden sensorineural hearing loss, right. Prednisone 40mg  qd x 5d. ENT referral ordered.   #2 hyperlipidemia.  Doing well on Zetia 10 mg a day and rosuvastatin 20 mg a day except taking 40 mg daily 4 days a week. Lipid panel today.   #3 prediabetes. Hemoglobin A1c and fasting glucose today."  INTERIM HX: Tyler [name] is doing well.  Hearing loss: ENT evaluation in December 2023 showed sudden sensorineural hearing loss, 10 days of steroids prescribed by ENT, showed no abnormalities to explain his symptoms. Ultimately, hearing aid pursued and he has this now and it helps..  Cardiology follow-up 08/04/2022 encounter reviewed.  All was stable, no changes made.  ROS as above, plus--> no fevers, no CP, no SOB, no wheezing, no cough, no dizziness, no HAs, no rashes, no melena/hematochezia.  No polyuria or polydipsia.  No myalgias or arthralgias.  No focal weakness, paresthesias, or tremors.  No acute vision abnormalities.  No dysuria or unusual/new urinary urgency or frequency.  No recent changes in lower legs. No n/v/d or abd pain.  No palpitations.    Past Medical History:  Diagnosis Date   Allergy    Arthritis    BPH (benign prostatic hypertrophy)    Flomax not helpful 2018/19 per pt report   Chronic pain of right knee    Left knee as well (osteoarthritis).  Dr. Cleophas Dunker   Colon polyps 2013   NON-adenomatous 2013---recall 10 yrs.   Elevated PSA 07/10/2016   Dr. Patsi Sears saw him 07/15/16, did f/u PSA and it was 2.78: f/u was recommended but pt did not do this as of 07/2017.  Repeat PSA here 07/2017 was 3.1, with 19% free (free a little low).  Pt then followed up with Dr. Patsi Sears and tx for BPH w/plan of  repeat PSA 1 yr.  PSA 07/2018 down to 3.21.   Erectile dysfunction    Urol rx'd sildenafil 20mg  tabs 11/2017.   Fatigue    + excessive daytime somnolence   GERD (gastroesophageal reflux disease)    Hallux limitus 04/2016   1st MPJ joint R foot.  Diclofenac helpful.  Injected by podiatrist 11/2016.   Hyperlipidemia    Dr. Rennis Golden   Hypogonadism male    Left main coronary artery disease 12/07/2018   NSTEMI:  ost LM ulcerated 75%, pCx 90%, EF 35-45% --> CABG x2 (LIMA-LAD, SVG-Cx) 12/14/2018.  LV function normal on Intra-Op TEE   NSTEMI (non-ST elevated myocardial infarction) (HCC) 12/2018   Critical left main and proximal circumflex disease--CABG x2 (Dr. Tyrone Sage.  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 A1c 6.4% 07/2021. 6.2% Nov 2023   Sleep apnea     Past Surgical History:  Procedure Laterality Date   COLONOSCOPY  1998 & 2013   Dr Marina Goodell; 2 benign polyps 2013.  04/2022 NO POLYPS->no further screening colonoscopies needed   CORONARY ARTERY BYPASS GRAFT N/A 12/08/2018   Procedure: CORONARY ARTERY BYPASS GRAFTING (CABG) x 2, SVG TO  OM1, LIMA TO LAD, USING LEFT INTERNAL MAMMARY ARTERY AND RIGHT GREATER SAPHENOUS VEIN  HARVESTED ENDOSCOPICALLY;  Surgeon: Delight Ovens, MD;  Location: [facility] OR;  Service: Open Heart Surgery;  Laterality: N/A;   hydrocoelectomy     LEFT HEART CATH AND CORONARY ANGIOGRAPHY N/A 12/07/2018   Procedure: LEFT HEART CATH AND CORONARY ANGIOGRAPHY;  Surgeon: Corky Crafts, MD;  Location: [facility];; Ost LM 75%-ulcerated, pCx 90%, EF 35-45% --> referred for CABG   nocturnal polysomn  1990s   Sleep lab: no OSA (??)--Dr. Shelle Iron said he needs another sleep study as of 2013   ORBITAL FRACTURE SURGERY     TEE WITHOUT CARDIOVERSION N/A 12/08/2018   Procedure: TRANSESOPHAGEAL ECHOCARDIOGRAM (TEE);  Surgeon: Delight Ovens, MD;  Location: [facility] OR;  Service: Open Heart Surgery;  Laterality: N/A;   TONSILLECTOMY AND ADENOIDECTOMY     TRANSTHORACIC ECHOCARDIOGRAM  12/07/2018   In setting of non-STEMI: EF 55-60%.  GR 1 DD.  Mild inferior-inferolateral and apical lateral HK.  Normal RV.  Normal valves   UPPER GI ENDOSCOPY  2000 & 2011   dilation 2000    Outpatient Medications Prior to Visit  Medication Sig Dispense Refill   aspirin EC 81 MG tablet Take 81 mg by mouth daily.     ezetimibe (ZETIA) 10 MG tablet Take 1 tablet (10 mg total) by mouth daily. Pt must keep upcoming appt in May for further refills 90 tablet 0   metoprolol tartrate (LOPRESSOR) 25 MG tablet TAKE 1 TABLET BY MOUTH TWICE A DAY 180 tablet 3   Multiple Vitamins-Minerals (MULTIVITAMIN MEN PO) Take by mouth daily.     omeprazole (PRILOSEC) 20 MG capsule Take 20 mg by mouth daily.     rosuvastatin (CRESTOR) 20 MG tablet Take 1 tablet (20 mg total) by mouth daily. (1 tablet daily Monday Wednesday Friday, 2 tablets(40mg ) daily Tuesday, Thursday, Saturday, Sunday). 138 tablet 3   silodosin (RAPAFLO) 8 MG CAPS capsule Take 8 mg by mouth daily.     ondansetron (ZOFRAN-ODT) 4 MG disintegrating tablet Take 1 tablet (4 mg total) by mouth every 8 (eight) hours as needed for nausea or vomiting. 10 tablet 0   promethazine (PHENERGAN) 25 MG tablet Take 1 tablet (25 mg total) by mouth every 8 (eight) hours as needed for nausea or vomiting. 20 tablet 1   predniSONE (DELTASONE) 20 MG tablet 2 tabs po qd x 5d 10 tablet 0   No facility-administered medications prior to visit.    Allergies  Allergen Reactions   Sulfonamide Derivatives     REACTION: rash Because of a history of documented adverse serious drug reaction;Medi Alert bracelet  is recommended    Review of Systems As per HPI  PE:    01/21/2023   10:00 AM 08/04/2022   10:03 AM 07/24/2022    5:45 AM  Vitals with BMI  Height  5\' 10"    Weight 231 lbs 6 oz 228 lbs 10 oz   BMI  32.8   Systolic 122  117 141  Diastolic 75 60 71  Pulse 63 60 58     Physical Exam  Gen: Alert, well appearing.  Patient is oriented to person, place, time, and situation. AFFECT: pleasant, lucid thought and speech. CV: RRR, no m/r/g.   LUNGS: CTA bilat, nonlabored resps, good aeration in all lung fields. EXT: no clubbing or cyanosis.  no edema.    LABS:  Last CBC Lab Results  Component Value Date   WBC 8.0 07/23/2022   HGB 14.8 07/23/2022   HCT 43.4 07/23/2022   MCV  91.2 07/23/2022   MCH 31.1 07/23/2022   RDW 12.5 07/23/2022   PLT 189 07/23/2022   Last metabolic panel Lab Results  Component Value Date   GLUCOSE 140 (H) 07/23/2022   NA 139 07/23/2022   K 4.0 07/23/2022   CL 105 07/23/2022   CO2 23 07/23/2022   BUN 19 07/23/2022   CREATININE 0.81 07/23/2022   GFRNONAA >60 07/23/2022   CALCIUM 9.6 07/23/2022   PROT 6.5 07/23/2022   ALBUMIN 4.2 07/23/2022   BILITOT 0.6 07/23/2022   ALKPHOS 63 07/23/2022   AST 24 07/23/2022   ALT 32 07/23/2022   ANIONGAP 11 07/23/2022   Last lipids Lab Results  Component Value Date   CHOL 113 07/23/2022   HDL 45.40 07/23/2022   LDLCALC 44 07/23/2022   LDLDIRECT 116.0 07/14/2018   TRIG 119.0 07/23/2022   CHOLHDL 2 07/23/2022   Last hemoglobin A1c Lab Results  Component Value Date   HGBA1C 5.5 01/21/2023   HGBA1C 5.5 01/21/2023   HGBA1C 5.5 (A) 01/21/2023   HGBA1C 5.5 01/21/2023   Lab Results  Component Value Date   TESTOSTERONE 352.85 06/20/2015   IMPRESSION AND PLAN:  #1 prediabetes, doing great on diet and exercise. POC hba1c today is 5.5%.  #2 hypercholesterolemia, doing well on Zetia 10 mg a day and rosuvastatin 20 mg daily on Monday Wednesday and Friday and 40 mg on Tuesday Thursday Saturday and Sunday. He is not fasting today. He will return for lab visit for fasting lipid panel and complete metabolic panel.  #3 sudden sensorineural hearing loss, right. Workup by ENT unrevealing. He has a hearing aid and this is  helping.  4.  Coronary artery disease. Asymptomatic. Continue aspirin, statin, Zetia, and beta-blocker. He has cardiology follow-up in about a week.  An After Visit Summary was printed and given to the patient.  FOLLOW UP: Return for annual CPE (fasting). Next cpe 07/2022  Signed:  Santiago Bumpers, MD           01/21/2023

## 2023-01-27 ENCOUNTER — Encounter: Payer: Self-pay | Admitting: Family Medicine

## 2023-01-27 ENCOUNTER — Other Ambulatory Visit (INDEPENDENT_AMBULATORY_CARE_PROVIDER_SITE_OTHER): Payer: Medicare HMO

## 2023-01-27 DIAGNOSIS — E7800 Pure hypercholesterolemia, unspecified: Secondary | ICD-10-CM | POA: Diagnosis not present

## 2023-01-27 DIAGNOSIS — R7303 Prediabetes: Secondary | ICD-10-CM | POA: Diagnosis not present

## 2023-01-27 LAB — COMPREHENSIVE METABOLIC PANEL
ALT: 29 U/L (ref 0–53)
AST: 24 U/L (ref 0–37)
Albumin: 4.1 g/dL (ref 3.5–5.2)
Alkaline Phosphatase: 61 U/L (ref 39–117)
BUN: 21 mg/dL (ref 6–23)
CO2: 24 mEq/L (ref 19–32)
Calcium: 9 mg/dL (ref 8.4–10.5)
Chloride: 108 mEq/L (ref 96–112)
Creatinine, Ser: 0.82 mg/dL (ref 0.40–1.50)
GFR: 89.51 mL/min (ref >60.00)
Glucose, Bld: 99 mg/dL (ref 70–99)
Potassium: 4.2 mEq/L (ref 3.5–5.1)
Sodium: 140 mEq/L (ref 135–145)
Total Bilirubin: 0.6 mg/dL (ref 0.2–1.2)
Total Protein: 6.2 g/dL (ref 6.0–8.3)

## 2023-01-27 LAB — LIPID PANEL
Cholesterol: 108 mg/dL (ref 0–200)
HDL: 46.6 mg/dL (ref >39.00)
LDL Cholesterol: 43 mg/dL (ref 0–99)
NonHDL: 61.04
Total CHOL/HDL Ratio: 2
Triglycerides: 92 mg/dL (ref 0.0–149.0)
VLDL: 18.4 mg/dL (ref 0.0–40.0)

## 2023-01-28 ENCOUNTER — Ambulatory Visit: Payer: Medicare HMO | Attending: Cardiology | Admitting: Cardiology

## 2023-01-28 VITALS — BP 112/64 | HR 61 | Ht 71.0 in | Wt 229.8 lb

## 2023-01-28 DIAGNOSIS — I2510 Atherosclerotic heart disease of native coronary artery without angina pectoris: Secondary | ICD-10-CM | POA: Diagnosis not present

## 2023-01-28 DIAGNOSIS — I214 Non-ST elevation (NSTEMI) myocardial infarction: Secondary | ICD-10-CM | POA: Diagnosis not present

## 2023-01-28 DIAGNOSIS — E785 Hyperlipidemia, unspecified: Secondary | ICD-10-CM

## 2023-01-28 DIAGNOSIS — R5382 Chronic fatigue, unspecified: Secondary | ICD-10-CM

## 2023-01-28 DIAGNOSIS — Z951 Presence of aortocoronary bypass graft: Secondary | ICD-10-CM | POA: Diagnosis not present

## 2023-01-28 DIAGNOSIS — G4733 Obstructive sleep apnea (adult) (pediatric): Secondary | ICD-10-CM | POA: Diagnosis not present

## 2023-01-28 NOTE — Progress Notes (Signed)
Primary Care Provider: Jeoffrey Massed, MD [facility] Cardiologist: Bryan Lemma, MD Electrophysiologist: None  Clinic Note: Chief Complaint  Patient presents with   Follow-up    Doing well from a cardiac standpoint-major issue was right-sided hearing loss reportedly related to viral illness.   Coronary Artery Disease    No angina.  No heart failure.    ===================================  ASSESSMENT/PLAN   Problem List Items Addressed This Visit       Cardiology Problems   NSTEMI (non-ST elevated myocardial infarction) (HCC) (Chronic)    He did have a regional wall motion abnormality associated with the LCx culprit lesion-inferolateral hypokinesis.  Follow-up echo showed preserved EF 55 to 60%. No active CHF symptoms and no recurrent angina.      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  No further angina or heart failure symptoms.  Doing well.  Remains very active.  We stabilized on his regimen by dropping down his statin dose and he is doing much better.  He is only on low-dose beta-blocker.  Plan: Continue low-dose Lopressor 5 mg twice daily Lipids well-controlled: Most recent lipids showed LDL of 43 Continue rosuvastatin 20 mg on Monday Wednesday Friday, and 40 mg on Tuesday Thursday Saturday and Sunday. Continue Zetia. Continue ASA 81 mg daily.  Okay to hold for procedures or surgeries (5 to 7 days)  He will be due for follow-up ischemic evaluation 5 years out from CABG => we will plan for him to have a Treadmill Myoview in April/May timeframe prior to his annual follow-up with me next year in May/June 2025.      Relevant Orders   EKG 12-Lead (Completed)   Cardiac Stress Test: Informed  Consent Details: Physician/Practitioner Attestation; Transcribe to consent form and obtain patient signature   MYOCARDIAL PERFUSION IMAGING   Hyperlipidemia with target LDL less than 70 (Chronic)    Last lipids look great.  LDL is under 45.  At this point he is on stable dose of rosuvastatin taking 40 mg 4 days a week and 20 mg 3 days a week as well as Zetia.  CONTINUE current regimen for now since he is doing well.      Relevant Orders   EKG 12-Lead (Completed)   Cardiac Stress Test: Informed Consent Details: Physician/Practitioner Attestation; Transcribe to consent form and obtain patient signature   MYOCARDIAL PERFUSION IMAGING     Other   S/P CABG x 2 (Chronic)    Has done well since surgery.  Will be 5 years out next year-will do baseline reevaluation with treadmill Myoview prior to next annual visit.      Relevant Orders   EKG 12-Lead (Completed)   Cardiac Stress Test: Informed Consent Details: Physician/Practitioner Attestation; Transcribe to consent form and obtain patient signature   MYOCARDIAL PERFUSION IMAGING   Obstructive sleep apnea (Chronic)   Chronic fatigue (Chronic)    Much better with reduced dose of statin.      ===================================  HPI:    Tyler [name] is a 72 y.o. male with a PMH below who presents today for 6 month f/u - at the request of Jeoffrey Massed, MD. CAD -> NSTEMI 11/2018 -  Cath with ulcerated Tyler [name]  LM 75%, pCx 90%.  EF 35 to 45%=> referred for CABG CABG x 2 12/08/2018: LIMA-LAD, SVG-Cx, Intra-op TEE EF 50-55%. (Dr. Tyrone Sage) Post-OP Afib -treated with short course Amiodarone (no recurrence) => Not on DOAC  BECKEM ANCAR was last seen on 08/04/2022: Doing well overall from cardiac standpoint just having some dizziness when both ENT and neurology evaluation upcoming for vertigo).  Was treated with prednisone.  From a cardiac standpoint doing fine.  Energy level better since cutting the statin dose down.  Walking about 2  miles a day 5 days a week without angina or dyspnea.  No CHF symptoms.  No claudication.  Recent Hospitalizations: N/A  Reviewed  CV studies:    The following studies were reviewed today: (if available, images/films reviewed: From Epic Chart or Care Everywhere) No new studies:  Interval History:   Tyler [name] presents here today for routine follow-up doing fine for cardiac standpoint.  He had a symptom of viral illness or something where he is been having some dizziness and right-sided hearing loss.  He is not yet wearing a hearing aid, but is definitely frustrated by this.  Unfortunately, with the issues with dizziness and the hearing loss he has not really gotten back to doing his routine exercise as much.  He is however pretty active and is out and about quite a bit.  Just has not gotten back to doing his walking routinely.  He still has no symptoms of chest pain pressure or dyspnea at rest or exertion. No angina or heart failure symptoms.  No arrhythmia symptoms.  No syncope or near syncope despite having the vertigo dizziness.  CV Review of Symptoms (Summary): no chest pain or dyspnea on exertion positive for - decreased exercise more related to ENT and vertigo issues-as a result of some mild exertional intolerance. negative for - edema, irregular heartbeat, orthopnea, palpitations, paroxysmal nocturnal dyspnea, rapid heart rate, shortness of breath, or syncope or near syncope, TIA/RCVS or claudication  REVIEWED OF SYSTEMS   Review of Systems  Constitutional:  Negative for malaise/fatigue (Just not as active because of the ear issues.  Not feeling the same fatigue that he had had before we reduce his statin dose) and weight loss.  HENT:  Positive for hearing loss (Right-sided hearing loss). Negative for congestion and nosebleeds.   Respiratory:  Negative for cough and shortness of breath.   Gastrointestinal:  Negative for abdominal pain, blood in stool and melena.  Genitourinary:   Negative for hematuria.  Musculoskeletal:  Negative for joint pain.  Neurological:  Positive for dizziness and headaches. Negative for focal weakness.  Endo/Heme/Allergies:  Does not bruise/bleed easily.   I have reviewed and (if needed) personally updated the patient's problem list, medications, allergies, past medical and surgical history, social and family history.   PAST MEDICAL HISTORY   Past Medical History:  Diagnosis Date   BPH (benign prostatic hypertrophy)    Flomax not helpful 2018/19 per pt report   Chronic pain of right knee    Left knee as well (osteoarthritis).  Dr. Cleophas Dunker   Colon polyps 2013   NON-adenomatous 2013---recall 10 yrs.   Elevated PSA 07/10/2016   Dr. Patsi Sears saw him 07/15/16, did f/u PSA and it was 2.78: f/u was recommended but pt did not do this as of 07/2017.  Repeat PSA here 07/2017 was 3.1, with 19% free (free a little low).  Pt then followed up with Dr. Patsi Sears and tx for BPH w/plan of repeat PSA 1 yr.  PSA 07/2018 down to 3.21.   Erectile dysfunction    Urol rx'd sildenafil 20mg  tabs 11/2017.   GERD (gastroesophageal reflux disease)    Hallux limitus 04/2016   1st MPJ joint R foot.  Diclofenac helpful.  Injected by podiatrist 11/2016.   Hyperlipidemia    Dr. Rennis Golden   Hypogonadism male    Left main coronary artery disease 12/07/2018   NSTEMI:  ost LM ulcerated 75%, pCx 90%, EF 35-45% --> CABG x2 (LIMA-LAD, SVG-Cx) 12/14/2018.  LV function normal on Intra-Op TEE   NSTEMI (non-ST elevated myocardial infarction) (HCC) 12/2018   Critical left main and proximal circumflex disease--CABG x2 (Dr. Tyrone Sage.  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 A1c 6.4% 07/2021. 6.2% Nov 2023   Sleep apnea     PAST SURGICAL HISTORY   Past Surgical History:  Procedure Laterality Date    COLONOSCOPY  1998 & 2013   Dr Marina Goodell; 2 benign polyps 2013.  04/2022 NO POLYPS->no further screening colonoscopies needed   CORONARY ARTERY BYPASS GRAFT N/A 12/08/2018   Procedure: CORONARY ARTERY BYPASS GRAFTING (CABG) x 2, SVG TO  OM1, LIMA TO LAD, USING LEFT INTERNAL MAMMARY ARTERY AND RIGHT GREATER SAPHENOUS VEIN HARVESTED ENDOSCOPICALLY;  Surgeon: Delight Ovens, MD;  Location: [facility] OR;  Service: Open Heart Surgery;  Laterality: N/A;   hydrocoelectomy     LEFT HEART CATH AND CORONARY ANGIOGRAPHY N/A 12/07/2018   Procedure: LEFT HEART CATH AND CORONARY ANGIOGRAPHY;  Surgeon: Corky Crafts, MD;  Location: [facility] INVASIVE CV LAB;; Ost LM 75%-ulcerated, pCx 90%, EF 35-45% --> referred for CABG   nocturnal polysomn  1990s   Sleep lab: no OSA (??)--Dr. Shelle Iron said he needs another sleep study as of 2013   ORBITAL FRACTURE SURGERY     TEE WITHOUT CARDIOVERSION N/A 12/08/2018   Procedure: TRANSESOPHAGEAL ECHOCARDIOGRAM (TEE);  Surgeon: Delight Ovens, MD;  Location: [facility] OR;  Service: Open Heart Surgery;  Laterality: N/A;   TONSILLECTOMY AND ADENOIDECTOMY     TRANSTHORACIC ECHOCARDIOGRAM  12/07/2018   In setting of non-STEMI: EF 55-60%.  GR 1 DD.  Mild inferior-inferolateral and apical lateral HK.  Normal RV.  Normal valves   UPPER GI ENDOSCOPY  2000 & 2011   dilation 2000    MEDICATIONS/ALLERGIES   Current Meds  Medication Sig   metoprolol tartrate (LOPRESSOR) 25 MG tablet TAKE 1 TABLET BY MOUTH TWICE A DAY   Multiple Vitamins-Minerals (MULTIVITAMIN MEN PO) Take by mouth daily.   omeprazole (PRILOSEC) 20 MG capsule Take 20 mg by mouth daily.   rosuvastatin (CRESTOR) 20 MG tablet Take 1 tablet (20 mg total) by mouth daily. (1 tablet daily Monday Wednesday Friday, 2 tablets(40mg ) daily Tuesday, Thursday, Saturday, Sunday).   silodosin (RAPAFLO) 8 MG CAPS capsule Take 8 mg by mouth daily.    Allergies  Allergen Reactions   Sulfonamide Derivatives     REACTION: rash Because of a  history of documented adverse serious drug reaction;Medi Alert bracelet  is recommended    SOCIAL HISTORY/FAMILY HISTORY   Reviewed in Epic:  Pertinent findings:  Social History   Tobacco Use   Smoking status: Never   Smokeless tobacco: Never  Vaping Use   Vaping Use: Never used  Substance Use Topics   Alcohol use: Not Currently    Alcohol/week: 1.0  standard drink of alcohol    Types: 1 Cans of beer per week    Comment:  2 beers/ month   Drug use: No   Social History   Social History Narrative   Married, 1 son.   Lives in [place].   Tax adviser -self-employed   No tob, no alc, no drugs.       OBJCTIVE -PE, EKG, labs   Wt Readings from Last 3 Encounters:  01/28/23 229 lb 12.8 oz (104.2 kg)  01/21/23 231 lb 6.4 oz (105 kg)  08/04/22 228 lb 9.6 oz (103.7 kg)    Physical Exam: BP 112/64   Pulse 61   Ht 5\' 11"  (1.803 m)   Wt 229 lb 12.8 oz (104.2 kg)   SpO2 95%   BMI 32.05 kg/m  Physical Exam Vitals reviewed.  Constitutional:      General: He is not in acute distress.    Appearance: Normal appearance. He is obese. He is not ill-appearing or toxic-appearing.  HENT:     Ears:     Comments: Heart varying/hearing loss in right ear Neck:     Vascular: No carotid bruit or JVD.  Cardiovascular:     Rate and Rhythm: Normal rate and regular rhythm. No extrasystoles are present.    Chest Wall: PMI is not displaced.     Pulses: Normal pulses.     Heart sounds: S1 normal and S2 normal. Heart sounds are distant. No murmur heard.    No friction rub. No gallop.  Pulmonary:     Effort: Pulmonary effort is normal. No respiratory distress.     Breath sounds: Normal breath sounds. No wheezing, rhonchi or rales.  Musculoskeletal:        General: No swelling. Normal range of motion.     Cervical back: Normal range of motion and neck supple.  Skin:    General: Skin is warm and dry.  Neurological:     General: No focal deficit present.     Mental  Status: He is alert and oriented to person, place, and time. Mental status is at baseline.  Psychiatric:        Mood and Affect: Mood normal.        Behavior: Behavior normal.        Thought Content: Thought content normal.        Judgment: Judgment normal.     Adult ECG Report  Rate: 61 ;  Rhythm: normal sinus rhythm and normal axis, intervals durations. ;   Narrative Interpretation: Normal  Recent Labs: Reviewed Lab Results  Component Value Date   CHOL 108 01/27/2023   HDL 46.60 01/27/2023   LDLCALC 43 01/27/2023   LDLDIRECT 116.0 07/14/2018   TRIG 92.0 01/27/2023   CHOLHDL 2 01/27/2023   Lab Results  Component Value Date   CREATININE 0.82 01/27/2023   BUN 21 01/27/2023   NA 140 01/27/2023   K 4.2 01/27/2023   CL 108 01/27/2023   CO2 24 01/27/2023      Latest Ref Rng & Units 07/23/2022    8:57 PM 07/23/2022    9:24 AM 07/22/2021   10:46 AM  CBC  WBC 4.0 - 10.5 K/uL 8.0  6.1  6.5   Hemoglobin 13.0 - 17.0 g/dL 08.6  57.8  46.9   Hematocrit 39.0 - 52.0 % 43.4  44.3  45.1   Platelets 150 - 400 K/uL 189  182.0  175.0     Lab Results  Component Value Date  HGBA1C 5.5 01/21/2023   HGBA1C 5.5 01/21/2023   HGBA1C 5.5 (A) 01/21/2023   HGBA1C 5.5 01/21/2023   Lab Results  Component Value Date   TSH 1.87 07/23/2022    ================================================== I spent a total of 18 minutes with the patient spent in direct patient consultation.  Additional time spent with chart review  / charting (studies, outside notes, etc): 14 min Total Time: 32 min  Current medicines are reviewed at length with the patient today.  (+/- concerns) none  Notice: This dictation was prepared with Dragon dictation along with smart phrase technology. Any transcriptional errors that result from this process are unintentional and may not be corrected upon review.  Studies Ordered:   Orders Placed This Encounter  Procedures   Cardiac Stress Test: Informed Consent Details:  Physician/Practitioner Attestation; Transcribe to consent form and obtain patient signature   MYOCARDIAL PERFUSION IMAGING   EKG 12-Lead   No orders of the defined types were placed in this encounter.   Patient Instructions / Medication Changes & Studies & Tests Ordered   Patient Instructions  Medication Instructions:  No changes   *If you need a refill on your cardiac medications before your next appointment, please call your pharmacy*   Lab Work:  Not needed   Testing/Procedures: Will be schedule in April/May 2025 prior to your appointment with Dr Herbie Baltimore  Your doctor has scheduled you for a Myocardial Perfusion scan to obtain information about the blood flow to your heart. The test consists of taking pictures of your heart in two phases: while resting and after a stress test.  The stress test may involve walking on a treadmill, or if you are unable to exercise adequately, you will be given a drug intended to have a similar effect on the heart to that of exercise.  The test will take approximately 3 to 4  hours to complete. .  How to prepare for your test: Do not eat or drink 2 hours prior to your test Do not consume products containing caffeine 12 hours prior to your test (examples: coffee (regular OR decaf), chocolate, sodas, tea) Your doctor may need you to hold certain medications prior to the test.  If so, these are listed below and should not be taken for 24 hours prior to the test.  If not listed below, you may take your medications as normal.  You may resume taking held medications on your normal schedule once the test is complete.   Meds to hold: do not stop any medications. Do bring a list of your current medications with you.  If you have held any meds in preparation for the test, please bring them, as you may be required to take them once the test is completed. Do wear comfortable clothes and walking shoes.  Do not wear dresses or overalls. Do NOT wear cologne, perfume,  aftershave, or fragranced lotions the day of your test (deodorants okay). If these instructions are not followed your test will have to be rescheduled.   A nuclear cardiologist will review your test, prepare a report and send it to your physician.   If you have questions or concerns about your appointment, you can call the Nuclear Cardiology department at [phone]. If you cannot keep your appointment, please provide 48 hours notification to avoid a possible $50.00 charge to your account.   Please arrive 15 minutes prior to your appointment time for registration and insurance purposes   Follow-Up: At [facility], you and your health  needs are our priority.  As part of our continuing mission to provide you with exceptional heart care, we have created designated Provider Care Teams.  These Care Teams include your primary Cardiologist (physician) and Advanced Practice Providers (APPs -  Physician Assistants and Nurse Practitioners) who all work together to provide you with the care you need, when you need it.     Your next appointment:   12 month(s)  The format for your next appointment:   In Person  Provider:   Bryan Lemma, MD        Marykay Lex, MD, MS Bryan Lemma, M.D., M.S. Interventional Cardiologist  [facility]  Pager # [phone] Phone # [phone] [address] [place], [place] [address]   Thank you for choosing [facility] [place]!!

## 2023-01-28 NOTE — Patient Instructions (Addendum)
Medication Instructions:  No changes   *If you need a refill on your cardiac medications before your next appointment, please call your pharmacy*   Lab Work:  Not needed   Testing/Procedures: Will be schedule in April/May 2025 prior to your appointment with Dr Herbie Baltimore  Your doctor has scheduled you for a Myocardial Perfusion scan to obtain information about the blood flow to your heart. The test consists of taking pictures of your heart in two phases: while resting and after a stress test.  The stress test may involve walking on a treadmill, or if you are unable to exercise adequately, you will be given a drug intended to have a similar effect on the heart to that of exercise.  The test will take approximately 3 to 4  hours to complete. .  How to prepare for your test: Do not eat or drink 2 hours prior to your test Do not consume products containing caffeine 12 hours prior to your test (examples: coffee (regular OR decaf), chocolate, sodas, tea) Your doctor may need you to hold certain medications prior to the test.  If so, these are listed below and should not be taken for 24 hours prior to the test.  If not listed below, you may take your medications as normal.  You may resume taking held medications on your normal schedule once the test is complete.   Meds to hold: do not stop any medications. Do bring a list of your current medications with you.  If you have held any meds in preparation for the test, please bring them, as you may be required to take them once the test is completed. Do wear comfortable clothes and walking shoes.  Do not wear dresses or overalls. Do NOT wear cologne, perfume, aftershave, or fragranced lotions the day of your test (deodorants okay). If these instructions are not followed your test will have to be rescheduled.   A nuclear cardiologist will review your test, prepare a report and send it to your physician.   If you have questions or concerns about your  appointment, you can call the Nuclear Cardiology department at [phone]. If you cannot keep your appointment, please provide 48 hours notification to avoid a possible $50.00 charge to your account.   Please arrive 15 minutes prior to your appointment time for registration and insurance purposes   Follow-Up: At [facility], you and your health needs are our priority.  As part of our continuing mission to provide you with exceptional heart care, we have created designated Provider Care Teams.  These Care Teams include your primary Cardiologist (physician) and Advanced Practice Providers (APPs -  Physician Assistants and Nurse Practitioners) who all work together to provide you with the care you need, when you need it.     Your next appointment:   12 month(s)  The format for your next appointment:   In Person  Provider:   Bryan Lemma, MD

## 2023-02-15 ENCOUNTER — Encounter: Payer: Self-pay | Admitting: Cardiology

## 2023-02-15 NOTE — Assessment & Plan Note (Addendum)
Now over 4 years out from his CABG x 2.  No further angina or heart failure symptoms.  Doing well.  Remains very active.  We stabilized on his regimen by dropping down his statin dose and he is doing much better.  He is only on low-dose beta-blocker.  Plan: Continue low-dose Lopressor 5 mg twice daily Lipids well-controlled: Most recent lipids showed LDL of 43 Continue rosuvastatin 20 mg on Monday Wednesday Friday, and 40 mg on Tuesday Thursday Saturday and Sunday. Continue Zetia. Continue ASA 81 mg daily.  Okay to hold for procedures or surgeries (5 to 7 days)  He will be due for follow-up ischemic evaluation 5 years out from CABG => we will plan for him to have a Treadmill Myoview in April/May timeframe prior to his annual follow-up with me next year in May/June 2025.

## 2023-02-15 NOTE — Assessment & Plan Note (Signed)
He did have a regional wall motion abnormality associated with the LCx culprit lesion-inferolateral hypokinesis.  Follow-up echo showed preserved EF 55 to 60%. No active CHF symptoms and no recurrent angina.

## 2023-02-15 NOTE — Assessment & Plan Note (Signed)
Much better with reduced dose of statin.

## 2023-02-15 NOTE — Assessment & Plan Note (Signed)
Last lipids look great.  LDL is under 45.  At this point he is on stable dose of rosuvastatin taking 40 mg 4 days a week and 20 mg 3 days a week as well as Zetia.  CONTINUE current regimen for now since he is doing well.

## 2023-02-15 NOTE — Assessment & Plan Note (Signed)
Has done well since surgery.  Will be 5 years out next year-will do baseline reevaluation with treadmill Myoview prior to next annual visit.

## 2023-03-08 ENCOUNTER — Other Ambulatory Visit: Payer: Self-pay | Admitting: Cardiology

## 2023-04-23 ENCOUNTER — Encounter (INDEPENDENT_AMBULATORY_CARE_PROVIDER_SITE_OTHER): Payer: Self-pay

## 2023-04-26 ENCOUNTER — Other Ambulatory Visit: Payer: Self-pay | Admitting: Cardiology

## 2023-05-27 ENCOUNTER — Ambulatory Visit (INDEPENDENT_AMBULATORY_CARE_PROVIDER_SITE_OTHER): Payer: Medicare HMO

## 2023-05-27 VITALS — Wt 229.0 lb

## 2023-05-27 DIAGNOSIS — Z0000 Encounter for general adult medical examination without abnormal findings: Secondary | ICD-10-CM | POA: Diagnosis not present

## 2023-05-27 NOTE — Patient Instructions (Signed)
Tyler [name] , Thank you for taking time to come for your Medicare Wellness Visit. I appreciate your ongoing commitment to your health goals. Please review the following plan we discussed and let me know if I can assist you in the future.   Referrals/Orders/Follow-Ups/Clinician Recommendations: stay healthy and active pt will follow up with flu vaccine  This is a list of the screening recommended for you and due dates:  Health Maintenance  Topic Date Due   COVID-19 Vaccine (4 - 2023-24 season) 05/10/2023   Flu Shot  12/07/2023*   Medicare Annual Wellness Visit  05/26/2024   DTaP/Tdap/Td vaccine (3 - Td or Tdap) 08/08/2031   Colon Cancer Screening  05/06/2032   Pneumonia Vaccine  Completed   Hepatitis C Screening  Completed   Zoster (Shingles) Vaccine  Completed   HPV Vaccine  Aged Out  *Topic was postponed. The date shown is not the original due date.    Advanced directives: (Copy Requested) Please bring a copy of your health care power of attorney and living will to the office to be added to your chart at your convenience.  Next Medicare Annual Wellness Visit scheduled for next year: Yes

## 2023-05-27 NOTE — Progress Notes (Addendum)
Subjective:   Tyler [name] is a 72 y.o. male who presents for Medicare Annual/Subsequent preventive examination.  Visit Complete: Virtual  I connected with  Kellie Simmering on 05/27/23 by a audio enabled telemedicine application and verified that I am speaking with the correct person using two identifiers.  Patient Location: Home  Provider Location: Home Office  I discussed the limitations of evaluation and management by telemedicine. The patient expressed understanding and agreed to proceed.  Patient Medicare AWV questionnaire was completed by the patient on 05/27/23; I have confirmed that all information answered by patient is correct and no changes since this date.  Vital Signs: Unable to obtain new vitals due to this being a telehealth visit.   Cardiac Risk Factors include: advanced age (>41men, >52 women);dyslipidemia;male gender;obesity (BMI >30kg/m2)     Objective:    Today's Vitals   05/27/23 1103  Weight: 229 lb (103.9 kg)   Body mass index is 31.94 kg/m.     05/27/2023   11:08 AM 07/23/2022    8:48 PM 05/21/2022   11:37 AM 05/30/2021    1:22 PM 12/06/2018    6:04 PM 12/06/2018    3:44 PM 07/13/2015    1:54 AM  Advanced Directives  Does Patient Have a Medical Advance Directive? Yes Yes Yes Yes Yes No No  Type of Estate agent of [place];Living will Healthcare Power of [place];Living will Healthcare Power of [place];Living will Living will Healthcare Power of [place];Living will    Does patient want to make changes to medical advance directive?  No - Patient declined   No - Patient declined    Copy of Healthcare Power of Attorney in Chart? No - copy requested No - copy requested No - copy requested  No - copy requested    Would patient like information on creating a medical advance directive?  No - Patient declined     No - patient declined information    Current Medications (verified) Outpatient Encounter Medications as of  05/27/2023  Medication Sig   aspirin EC 81 MG tablet Take 81 mg by mouth daily.   ezetimibe (ZETIA) 10 MG tablet TAKE 1 TABLET (10 MG TOTAL) BY MOUTH DAILY. PT MUST KEEP UPCOMING APPT IN MAY FOR FURTHER REFILLS   metoprolol tartrate (LOPRESSOR) 25 MG tablet TAKE 1 TABLET BY MOUTH TWICE A DAY   Multiple Vitamins-Minerals (MULTIVITAMIN MEN PO) Take by mouth daily.   omeprazole (PRILOSEC) 20 MG capsule Take 20 mg by mouth daily.   rosuvastatin (CRESTOR) 20 MG tablet TAKE 1 TABLET DAILY MONDAY WEDNESDAY FRIDAY, 2 TABLETS DAILY TUESDAY, THURSDAY, SATURDAY, SUNDAY.   silodosin (RAPAFLO) 8 MG CAPS capsule Take 8 mg by mouth daily.   No facility-administered encounter medications on file as of 05/27/2023.    Allergies (verified) Sulfonamide derivatives   History: Past Medical History:  Diagnosis Date   BPH (benign prostatic hypertrophy)    Flomax not helpful 2018/19 per pt report   Chronic pain of right knee    Left knee as well (osteoarthritis).  Dr. Cleophas Dunker   Colon polyps 2013   NON-adenomatous 2013---recall 10 yrs.   Elevated PSA 07/10/2016   Dr. Patsi Sears saw him 07/15/16, did f/u PSA and it was 2.78: f/u was recommended but pt did not do this as of 07/2017.  Repeat PSA here 07/2017 was 3.1, with 19% free (free a little low).  Pt then followed up with Dr. Patsi Sears and tx for BPH w/plan of repeat PSA 1 yr.  PSA 07/2018 down to 3.21.   Erectile dysfunction    Urol rx'd sildenafil 20mg  tabs 11/2017.   GERD (gastroesophageal reflux disease)    Hallux limitus 04/2016   1st MPJ joint R foot.  Diclofenac helpful.  Injected by podiatrist 11/2016.   Hyperlipidemia    Dr. Rennis Golden   Hypogonadism male    Left main coronary artery disease 12/07/2018   NSTEMI:  ost LM ulcerated 75%, pCx 90%, EF 35-45% --> CABG x2 (LIMA-LAD, SVG-Cx) 12/14/2018.  LV function normal on Intra-Op TEE   NSTEMI (non-ST elevated myocardial infarction) (HCC) 12/2018   Critical left main and proximal circumflex disease--CABG  x2 (Dr. Tyrone Sage.  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 A1c 6.4% 07/2021. 6.2% Nov 2023   Sleep apnea    Past Surgical History:  Procedure Laterality Date   COLONOSCOPY  1998 & 2013   Dr Marina Goodell; 2 benign polyps 2013.  04/2022 NO POLYPS->no further screening colonoscopies needed   CORONARY ARTERY BYPASS GRAFT N/A 12/08/2018   Procedure: CORONARY ARTERY BYPASS GRAFTING (CABG) x 2, SVG TO  OM1, LIMA TO LAD, USING LEFT INTERNAL MAMMARY ARTERY AND RIGHT GREATER SAPHENOUS VEIN HARVESTED ENDOSCOPICALLY;  Surgeon: Delight Ovens, MD;  Location: [facility] OR;  Service: Open Heart Surgery;  Laterality: N/A;   hydrocoelectomy     LEFT HEART CATH AND CORONARY ANGIOGRAPHY N/A 12/07/2018   Procedure: LEFT HEART CATH AND CORONARY ANGIOGRAPHY;  Surgeon: Corky Crafts, MD;  Location: [facility];; Ost LM 75%-ulcerated, pCx 90%, EF 35-45% --> referred for CABG   nocturnal polysomn  1990s   Sleep lab: no OSA (??)--Dr. Shelle Iron said he needs another sleep study as of 2013   ORBITAL FRACTURE SURGERY     TEE WITHOUT CARDIOVERSION N/A 12/08/2018   Procedure: TRANSESOPHAGEAL ECHOCARDIOGRAM (TEE);  Surgeon: Delight Ovens, MD;  Location: [facility] OR;  Service: Open Heart Surgery;  Laterality: N/A;   TONSILLECTOMY AND ADENOIDECTOMY     TRANSTHORACIC ECHOCARDIOGRAM  12/07/2018   In setting of non-STEMI: EF 55-60%.  GR 1 DD.  Mild inferior-inferolateral and apical lateral HK.  Normal RV.  Normal valves   UPPER GI ENDOSCOPY  2000 & 2011   dilation 2000   Family History  Problem Relation Age of Onset   Cancer Mother        Gynecologic   COPD Mother    Heart attack Father 77        CHF   Heart attack Maternal Grandfather        > 55   Heart attack Paternal Grandfather        >55   Colon cancer Neg Hx    Stomach cancer Neg Hx     Diabetes Neg Hx    Stroke Neg Hx    Colon polyps Neg Hx    Esophageal cancer Neg Hx    Rectal cancer Neg Hx    Social History   Socioeconomic History   Marital status: Married    Spouse name: Not on file   Number of children: 1   Years of education: Not on file   Highest education level: Associate degree: occupational, Scientist, product/process development, or vocational program  Occupational History   Not on file  Tobacco Use   Smoking status: Never   Smokeless tobacco: Never  Vaping  Use   Vaping status: Never Used  Substance and Sexual Activity   Alcohol use: Not Currently    Alcohol/week: 1.0 standard drink of alcohol    Types: 1 Cans of beer per week    Comment:  2 beers/ month   Drug use: No   Sexual activity: Yes  Other Topics Concern   Not on file  Social History Narrative   Married, 1 son.   Lives in [place].   Tax adviser -self-employed   No tob, no alc, no drugs.      Social Determinants of Health   Financial Resource Strain: Low Risk  (05/27/2023)   Overall Financial Resource Strain (CARDIA)    Difficulty of Paying Living Expenses: Not hard at all  Food Insecurity: No Food Insecurity (05/27/2023)   Hunger Vital Sign    Worried About Running Out of Food in the Last Year: Never true    Ran Out of Food in the Last Year: Never true  Transportation Needs: No Transportation Needs (05/27/2023)   PRAPARE - Administrator, Civil Service (Medical): No    Lack of Transportation (Non-Medical): No  Physical Activity: Sufficiently Active (05/27/2023)   Exercise Vital Sign    Days of Exercise per Week: 4 days    Minutes of Exercise per Session: 60 min  Stress: No Stress Concern Present (05/27/2023)   [employer] of Occupational Health - Occupational Stress Questionnaire    Feeling of Stress : Not at all  Social Connections: Socially Integrated (05/27/2023)   Social Connection and Isolation Panel [NHANES]    Frequency of Communication with Friends and  Family: Twice a week    Frequency of Social Gatherings with Friends and Family: Once a week    Attends Religious Services: More than 4 times per year    Active Member of [employer] or Organizations: Yes    Attends Engineer, structural: More than 4 times per year    Marital Status: Married    Tobacco Counseling Counseling given: Not Answered   Clinical Intake:  Pre-visit preparation completed: Yes  Pain : No/denies pain     BMI - recorded: 31.94 Nutritional Status: BMI > 30  Obese Nutritional Risks: None Diabetes: No  How often do you need to have someone help you when you read instructions, pamphlets, or other written materials from your doctor or pharmacy?: 1 - Never  Interpreter Needed?: No  Information entered by :: Lanier Ensign, LPN   Activities of Daily Living    05/27/2023    9:36 AM  In your present state of health, do you have any difficulty performing the following activities:  Hearing? 1  Comment has hearing aids  Vision? 0  Difficulty concentrating or making decisions? 0  Walking or climbing stairs? 0  Dressing or bathing? 0  Doing errands, shopping? 0  Preparing Food and eating ? N  Using the Toilet? N  In the past six months, have you accidently leaked urine? N  Do you have problems with loss of bowel control? N  Managing your Medications? N  Managing your Finances? N  Housekeeping or managing your Housekeeping? N    Patient Care Team: Jeoffrey Massed, MD as PCP - General (Family Medicine) Marykay Lex, MD as PCP - Cardiology (Cardiology) Hilarie Fredrickson, MD as Consulting Physician (Gastroenterology) Chilton Greathouse, MD as Consulting Physician (Pulmonary Disease) Helane Gunther, DPM as Consulting Physician (Podiatry) Jethro Bolus, MD (Inactive) as Consulting Physician (Urology) Valeria Batman, MD (  Inactive) as Consulting Physician (Orthopedic Surgery) Crista Elliot, MD as Consulting Physician (Urology) Delight Ovens, MD (Inactive) as Consulting Physician (Cardiothoracic Surgery) Duke, Roe Rutherford, PA as Physician Assistant (Cardiology) Rennis Golden Lisette Abu, MD as Consulting Physician (Cardiology)  Indicate any recent Medical Services you may have received from other than [facility] providers in the past year (date may be approximate).     Assessment:   This is a routine wellness examination for [place].  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 0 0 0 0 0  Number falls in past yr: 0   0 0  Injury with Fall? 0   0 0  Risk for fall due to : Impaired vision   No Fall Risks No Fall Risks;Impaired vision  Follow up Falls prevention discussed   Falls evaluation completed Falls prevention discussed    MEDICARE RISK AT HOME: Medicare Risk at Home Any stairs in or around the home?: Yes If so, are there any without handrails?: No Home free of loose throw rugs in walkways, pet beds, electrical cords, etc?: Yes Adequate lighting in your home to reduce risk of falls?: Yes Life alert?: No Use of a cane, walker or w/c?: No Grab bars in the bathroom?: Yes Shower chair or bench in shower?: Yes Elevated toilet seat or a handicapped toilet?: No  TIMED UP AND GO:  Was the test performed?  No    Cognitive Function:        05/27/2023    9:40 AM 05/21/2022    11:43 AM  6CIT Screen  What Year? 0 points 0 points  What month? 0 points 0 points  What time? 0 points 0 points  Count back from 20 0 points 0 points  Months in reverse 0 points 0 points  Repeat phrase 0 points 0 points  Total Score 0 points 0 points    Immunizations Immunization History  Administered Date(s) Administered   Fluad Quad(high Dose 65+) 06/03/2019, 07/20/2020, 06/20/2021, 06/23/2022   Influenza Split 07/09/2011, 06/14/2012   Influenza Whole 07/09/2007, 07/05/2010   Influenza, High Dose Seasonal PF 06/22/2018   Influenza,inj,Quad PF,6+ Mos 06/14/2013, 06/19/2014, 06/20/2015, 07/04/2016, 06/25/2017   PFIZER(Purple Top)SARS-COV-2 Vaccination 09/28/2019, 10/19/2019, 06/16/2020   Pneumococcal Conjugate-13 07/18/2019   Pneumococcal Polysaccharide-23 07/14/2018   Td 10/18/2010   Tdap 08/07/2021   Zoster Recombinant(Shingrix) 09/13/2018, 03/08/2019   Zoster, Live 06/22/2013    TDAP status: Up to date  Flu Vaccine status: Due, Education has been  provided regarding the importance of this vaccine. Advised may receive this vaccine at local pharmacy or Health Dept. Aware to provide a copy of the vaccination record if obtained from local pharmacy or Health Dept. Verbalized acceptance and understanding.  Pneumococcal vaccine status: Up to date  Covid-19 vaccine status: Information provided on how to obtain vaccines.   Qualifies for Shingles Vaccine? Yes   Zostavax completed Yes   Shingrix Completed?: Yes  Screening Tests Health Maintenance  Topic Date Due   COVID-19 Vaccine (4 - 2023-24 season) 05/10/2023   INFLUENZA VACCINE  12/07/2023 (Originally 04/09/2023)   Medicare Annual Wellness (AWV)  05/26/2024   DTaP/Tdap/Td (3 - Td or Tdap) 08/08/2031   Colonoscopy  05/06/2032   Pneumonia Vaccine 83+ Years old  Completed   Hepatitis C Screening  Completed   Zoster Vaccines- Shingrix  Completed   HPV VACCINES  Aged Out    Health Maintenance  Health Maintenance Due   Topic Date Due   COVID-19 Vaccine (4 - 2023-24 season) 05/10/2023    Colorectal cancer screening: Type of screening: Colonoscopy. Completed 05/06/22. Repeat every 10 years   Additional Screening:  Hepatitis C Screening: Completed 07/09/16  Vision Screening: Recommended annual ophthalmology exams for early detection of glaucoma and other disorders of the eye. Is the patient up to date with their annual eye exam?  Yes  Who is the provider or what is the name of the office in which the patient attends annual eye exams? [facility]  If pt is not established with a provider, would they like to be referred to a provider to establish care? No .   Dental Screening: Recommended annual dental exams for proper oral hygiene   Community Resource Referral / Chronic Care Management: CRR required this visit?  No   CCM required this visit?  No     Plan:     I have personally reviewed and noted the following in the patient's chart:   Medical and social history Use of alcohol, tobacco or illicit drugs  Current medications and supplements including opioid prescriptions. Patient is not currently taking opioid prescriptions. Functional ability and status Nutritional status Physical activity Advanced directives List of other physicians Hospitalizations, surgeries, and ER visits in previous 12 months Vitals Screenings to include cognitive, depression, and falls Referrals and appointments  In addition, I have reviewed and discussed with patient certain preventive protocols, quality metrics, and best practice recommendations. A written personalized care plan for preventive services as well as general preventive health recommendations were provided to patient.     Marzella Schlein, LPN   1/32/44   After Visit Summary: (MyChart) Due to this being a telephonic visit, the after visit summary with patients personalized plan was offered to patient via MyChart   Nurse Notes: none

## 2023-06-22 ENCOUNTER — Ambulatory Visit (INDEPENDENT_AMBULATORY_CARE_PROVIDER_SITE_OTHER): Payer: Medicare HMO | Admitting: Audiology

## 2023-06-22 ENCOUNTER — Ambulatory Visit (INDEPENDENT_AMBULATORY_CARE_PROVIDER_SITE_OTHER): Payer: Medicare HMO

## 2023-06-22 ENCOUNTER — Encounter (INDEPENDENT_AMBULATORY_CARE_PROVIDER_SITE_OTHER): Payer: Self-pay

## 2023-06-22 VITALS — Ht 71.0 in | Wt 229.0 lb

## 2023-06-22 DIAGNOSIS — H918X3 Other specified hearing loss, bilateral: Secondary | ICD-10-CM

## 2023-06-22 DIAGNOSIS — H90A31 Mixed conductive and sensorineural hearing loss, unilateral, right ear with restricted hearing on the contralateral side: Secondary | ICD-10-CM

## 2023-06-22 NOTE — Progress Notes (Signed)
[facility] [address], [address] [place], [place] [address]  Audiological Evaluation   Tyler [name] was referred today for a hearing evaluation by Dr. Allena Katz.  History: Symptoms Yes/No Details  Hearing Loss Yes Patient reported right sudden, severe hearing loss in November and concerns for possible left hearing loss.  Tinnitus Yes Patient reported right, roaring tinnitus and denied tinnitus in his left ear.  Balance Problems Yes Patient reported an episode of dizziness in November and intermittent imbalance since then.  Previous ear surgeries No Patient denied any previous ear surgeries.  Family History No Patient denied any family history of hearing loss.  Amplification Yes Patient reported infrequent use of his BICROS system.    Tympanogram: Right ear: Normal external ear canal volume with normal middle ear pressure and tympanic membrane compliance (Type A). Left ear: Normal external ear canal volume with normal middle ear pressure and tympanic membrane compliance (Type A).    Hearing Evaluation: The audiogram was completed using conventional audiometric techniques under headphones with good reliability.   The hearing test results indicate: Right ear: Profound hearing loss at 250 Hz rising to severe mixed hearing loss from [phone] sloping to profound mixed hearing loss from 6000-8000 Hz. Left ear: Mild hearing loss at 250 Hz rising to normal hearing sensitivity from [phone] sloping to moderate sensorineural hearing loss from 4000-8000 Hz.   Speech Recognition Thresholds were obtained at 80 dBHL masked in the right ear and 15 dBHL in the left ear.   Word Recognition Testing was completed using the NU-6 word lists at 85 dBHL with 55 dBHL of masking noise in the right ear and at 55 dBHL in the left ear and the patient scored 0% in the right ear and 96% in the left ear.   Recommendations: Repeat audiogram when changes are perceived or per MD. Continue  use of BICROS system. Consider various tinnitus strategies, including the use of a noise generator, hearing aids, or tinnitus retraining therapy.    Conley Rolls Nasha Diss, AUD, CCC-A 06/22/23

## 2023-06-22 NOTE — Progress Notes (Unsigned)
Dear Dr. Milinda Cave, Here is my assessment for our mutual patient, Tyler [name]. Thank you for allowing me the opportunity to care for your patient. Please do not hesitate to contact me should you have any other questions. Sincerely, Dr. Jovita Kussmaul  Otolaryngology Clinic Note Referring provider: Dr. Milinda Cave HPI:  Tyler [name] is a 72 y.o. male kindly referred by Dr. 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  Previous seen by Dr. Ernestene Kiel and from his notes, "Patient states he woke up on Tuesday, November 14 with hearing loss in the right ear. Over the next day and a half he developed vertigo "room spinning" symptoms. He was given prednisone, he recalls approximately 40 mg/day x 5 days by his primary care physician however vertigo worsened. He became sick and vomiting. He went to the emergency room July 24, 2022. He was told by his primary care that there was some concerns for inflammation of his balance nerve. He has not had any repeated episodes of vertigo however they were very severe when they occurred. He does feel a little bit out of it in general. Continues to report right-sided significant hearing loss and tinnitus. This is never happened before. No other past otologic history / otologic surgery."   He did not wish for intratympanic steroids but did get additional PO steroids. MRI IAC was also done which did not show retrocochlear lesion. 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  He then obtained a bicros, and with plans for observation.  He presents today as a new patient: - He corroborates above history and reports that he wished to seen because he had some popping and cracking in his left ear now without associated hearing loss.  He was worried that since that is how his right ear was behaving before the sudden hearing loss that he wished to be examined.  The popping and crackling has resolved and he currently denies any hearing loss for the left ear.  He denies any autoimmune  diseases, otorrhea, vertigo, imbalance, facial numbness, history of ear surgery or problems, barotrauma.  He does report that sometimes his ear feels like it does not open up.  He denies any other eustachian tube dysfunction symptoms.  He does report that he does not really use his hearing aid.  He was a prior firefighter and reports some noise exposure when he was in the [employer].  He denies significant allergy symptoms but does report some alternating nasal congestion.  He has had a prior history for correction of a deviated septum in the 1980s.  He denies typical AR symptoms.  No significant sinus infections or trouble or nasal symptoms.   PMH/Meds/All/SocHx/FamHx/ROS:   Past Medical History:  Diagnosis Date   BPH (benign prostatic hypertrophy)    Flomax not helpful 2018/19 per pt report   Chronic pain of right knee    Left knee as well (osteoarthritis).  Dr. Cleophas Dunker   Colon polyps 2013   NON-adenomatous 2013---recall 10 yrs.   Elevated PSA 07/10/2016   Dr. Patsi Sears saw him 07/15/16, did f/u PSA and it was 2.78: f/u was recommended but pt did not do this as of 07/2017.  Repeat PSA here 07/2017 was 3.1, with 19% free (free a little low).  Pt then followed up with Dr. Patsi Sears and tx for BPH w/plan of repeat PSA 1 yr.  PSA 07/2018 down to 3.21.   Erectile dysfunction    Urol rx'd sildenafil 20mg  tabs 11/2017.   GERD (gastroesophageal reflux disease)  Hallux limitus 04/2016   1st MPJ joint R foot.  Diclofenac helpful.  Injected by podiatrist 11/2016.   Hyperlipidemia    Dr. Rennis Golden   Hypogonadism male    Left main coronary artery disease 12/07/2018   NSTEMI:  ost LM ulcerated 75%, pCx 90%, EF 35-45% --> CABG x2 (LIMA-LAD, SVG-Cx) 12/14/2018.  LV function normal on Intra-Op TEE   NSTEMI (non-ST elevated myocardial infarction) (HCC) 12/2018   Critical left main and proximal circumflex disease--CABG x2 (Dr. Tyrone Sage.  LIMA-LAD, SVG-LCx)   Osteoarthritis, multiple sites    Postoperative  atrial fibrillation (HCC) 12/2018   Converted on amiodarone; amio likely to be only short term   Prediabetes    A1c 5.8% 2014  A1c 6.1% Nov 2021. A1c 6.4% 07/2021. 6.2% Nov 2023   Sleep apnea     Past Surgical History:  Procedure Laterality Date   COLONOSCOPY  1998 & 2013   Dr Marina Goodell; 2 benign polyps 2013.  04/2022 NO POLYPS->no further screening colonoscopies needed   CORONARY ARTERY BYPASS GRAFT N/A 12/08/2018   Procedure: CORONARY ARTERY BYPASS GRAFTING (CABG) x 2, SVG TO  OM1, LIMA TO LAD, USING LEFT INTERNAL MAMMARY ARTERY AND RIGHT GREATER SAPHENOUS VEIN HARVESTED ENDOSCOPICALLY;  Surgeon: Delight Ovens, MD;  Location: [facility];  Service: Open Heart Surgery;  Laterality: N/A;   hydrocoelectomy     LEFT HEART CATH AND CORONARY ANGIOGRAPHY N/A 12/07/2018   Procedure: LEFT HEART CATH AND CORONARY ANGIOGRAPHY;  Surgeon: Corky Crafts, MD;  Location: [facility];; Ost LM 75%-ulcerated, pCx 90%, EF 35-45% --> referred for CABG   nocturnal polysomn  1990s   Sleep lab: no OSA (??)--Dr. Shelle Iron said he needs another sleep study as of 2013   ORBITAL FRACTURE SURGERY     TEE WITHOUT CARDIOVERSION N/A 12/08/2018   Procedure: TRANSESOPHAGEAL ECHOCARDIOGRAM (TEE);  Surgeon: Delight Ovens, MD;  Location: [facility] OR;  Service: Open Heart Surgery;  Laterality: N/A;   TONSILLECTOMY AND ADENOIDECTOMY     TRANSTHORACIC ECHOCARDIOGRAM  12/07/2018   In setting of non-STEMI: EF 55-60%.  GR 1 DD.  Mild inferior-inferolateral and apical lateral HK.  Normal RV.  Normal valves   UPPER GI ENDOSCOPY  2000 & 2011   dilation 2000    Family History  Problem Relation Age of Onset   Cancer Mother        Gynecologic   COPD Mother    Heart attack Father 37        CHF   Heart attack Maternal Grandfather        > 55   Heart attack Paternal Grandfather        >55   Colon cancer Neg Hx    Stomach cancer Neg Hx    Diabetes Neg Hx    Stroke Neg Hx    Colon polyps Neg Hx    Esophageal cancer Neg  Hx    Rectal cancer Neg Hx     Social Connections: Socially Integrated (05/27/2023)   Social Connection and Isolation Panel [NHANES]    Frequency of Communication with Friends and Family: Twice a week    Frequency of Social Gatherings with Friends and Family: Once a week    Attends Religious Services: More than 4 times per year    Active Member of [employer] or Organizations: Yes    Attends Banker Meetings: More than 4 times per year    Marital Status: Married     Current Outpatient Medications:  aspirin EC 81 MG tablet, Take 81 mg by mouth daily., Disp: , Rfl:    ezetimibe (ZETIA) 10 MG tablet, TAKE 1 TABLET (10 MG TOTAL) BY MOUTH DAILY. PT MUST KEEP UPCOMING APPT IN MAY FOR FURTHER REFILLS, Disp: 90 tablet, Rfl: 2   metoprolol tartrate (LOPRESSOR) 25 MG tablet, TAKE 1 TABLET BY MOUTH TWICE A DAY, Disp: 180 tablet, Rfl: 3   Multiple Vitamins-Minerals (MULTIVITAMIN MEN PO), Take by mouth daily., Disp: , Rfl:    omeprazole (PRILOSEC) 20 MG capsule, Take 20 mg by mouth daily., Disp: , Rfl:    rosuvastatin (CRESTOR) 20 MG tablet, TAKE 1 TABLET DAILY MONDAY WEDNESDAY FRIDAY, 2 TABLETS DAILY TUESDAY, THURSDAY, SATURDAY, SUNDAY., Disp: 138 tablet, Rfl: 3   silodosin (RAPAFLO) 8 MG CAPS capsule, Take 8 mg by mouth daily., Disp: , Rfl:    Physical Exam:   Ht 5\' 11"  (1.803 m)   Wt 229 lb (103.9 kg)   BMI 31.94 kg/m   Salient findings:  CN II-XII intact  Bilateral EAC clear and TM intact with well pneumatized middle ear spaces; no gaze evoked nystagmus Weber 512: left Rinne 512: AC > BC AS; UTA on right (patient reports he hears it on left) Anterior rhinoscopy: Septum relatively midline, no masses noted No lesions of oral cavity/oropharynx No obviously palpable neck masses/lymphadenopathy/thyromegaly No respiratory distress or stridor  Independent Review of Additional Tests or Records:  Prior ENT, Audiology notes, and ENT notes reviewed and summarized above after independent  review  Audio interpreted independently, and reports: Type A and Type A tympanograms were obtained for the right and left ear respectively.  A Speech Reception Threshold (SRT) of 5 dB was obtained for the left ear and a Speech Detection Threshold (SDT) of 85 dB was obtained for the right ear.  To assess speech clarity, a Word Recognition Score (WRS) was completed for the left ear and yielded a score of 100% with a presentation level of 60 dB HL; testing could not be completed for the other ear.  The Speech Intelligibility Index, or SII, is a quantification of the proportion of speech information that is both audible and usable for a listener. It is a percentage (between 0% to 100%), and is correlated to the intelligibility of speech. For this individual, when listening to average conversational speech the predicted SII was: Right ear: 0% Left ear: 94%  The hearing evaluation revealed profound sensorineural hearing loss in the right ear from 250- 8000 Hz with only vibrotactile awareness of sound. In the left ear, thresholds were WNL from 250- 4000 Hz, then mild to moderate loss was present for 6000- 8000 Hz. Results were obtained using inserts.  Reliability was regarded as good.   06/2023 Audiogram was independently reviewed and interpreted by me and it reveals Right ear: profound SNHL; SRT 80, WRT 0%; Type A tymp Left ear: mild upsloping to normal downsloping to moderate SNHL; 96% word interpretation at 55dB; type A tympanogram    SNHL= Sensorineural hearing loss   MRI IAC independently interpreted 10/10/2022: no CPA masses noted; mastoids well aerated   Procedures:  None today  Impression & Plans:  Tyler [name] is a 72 y.o. male with  Asymmetric SNHL AD profound HL; AS mild-mod SNHL He has a BICROS and thresholds based on reports appear to be stable (though may have some low freq decline - unclear, may be ~5dB) - No symptoms currently, perhaps mild ETD but otherwise no  effusion or other symptoms - Prior MRI IAC neg -  We discussed need for regular audios and discussed CI on the right but he does not wish for this - f/u in 1 year with audio; RT precautions discussed including any HL or issue with left ear as it is now his only hearing hear  Thank you for allowing me the opportunity to care for your patient. Please do not hesitate to contact me should you have any other questions.  Sincerely, Jovita Kussmaul, MD Otolarynoglogist (ENT), [facility] Phone: [phone] Fax: [phone]  06/22/2023, 9:56 AM

## 2023-06-29 ENCOUNTER — Ambulatory Visit (INDEPENDENT_AMBULATORY_CARE_PROVIDER_SITE_OTHER): Payer: Medicare HMO

## 2023-06-29 DIAGNOSIS — Z23 Encounter for immunization: Secondary | ICD-10-CM

## 2023-06-29 NOTE — Progress Notes (Signed)
Pt presents for flu vaccine, given IM right deltoid, tolerated well.

## 2023-07-26 ENCOUNTER — Encounter: Payer: Self-pay | Admitting: Family Medicine

## 2023-07-26 NOTE — Progress Notes (Unsigned)
Office Note 07/27/2023  CC:  Chief Complaint  Patient presents with   Annual Exam    Pt is fasting.    Patient is a 72 y.o. male who is here for annual health maintenance exam and f/u HLD and prediabetes. A/P as of last visit: "1 prediabetes, doing great on diet and exercise. POC hba1c today is 5.5%.   #2 hypercholesterolemia, doing well on Zetia 10 mg a day and rosuvastatin 20 mg daily on Monday Wednesday and Friday and 40 mg on Tuesday Thursday Saturday and Sunday. He is not fasting today. He will return for lab visit for fasting lipid panel and complete metabolic panel.   #3 sudden sensorineural hearing loss, right. Workup by ENT unrevealing. He has a hearing aid and this is helping.   4.  Coronary artery disease. Asymptomatic. Continue aspirin, statin, Zetia, and beta-blocker. He has cardiology follow-up in about a week."  INTERIM HX: Feeling well. Still bothered by R ear hearing imp+buzzing. Hearing aid bothers him b/c excessive perception of background noise, hassle, etc.   Past Medical History:  Diagnosis Date   BPH (benign prostatic hypertrophy)    Flomax not helpful 2018/19 per pt report   Chronic pain of right knee    Left knee as well (osteoarthritis).  Dr. Cleophas Dunker   Colon polyps 2013   NON-adenomatous 2013 and 2023->no further screening   Elevated PSA 07/10/2016   Dr. Patsi Sears saw him 07/15/16, did f/u PSA and it was 2.78: f/u was recommended but pt did not do this as of 07/2017.  Repeat PSA here 07/2017 was 3.1, with 19% free (free a little low).  Pt then followed up with Dr. Patsi Sears and tx for BPH w/plan of repeat PSA 1 yr.  PSA 07/2018 down to 3.21.   Erectile dysfunction    Urol rx'd sildenafil 20mg  tabs 11/2017.   GERD (gastroesophageal reflux disease)    Hallux limitus 04/2016   1st MPJ joint R foot.  Diclofenac helpful.  Injected by podiatrist 11/2016.   Hyperlipidemia    Dr. Rennis Golden   Hypogonadism male    Left main coronary artery disease  12/07/2018   NSTEMI:  ost LM ulcerated 75%, pCx 90%, EF 35-45% --> CABG x2 (LIMA-LAD, SVG-Cx) 12/14/2018.  LV function normal on Intra-Op TEE   NSTEMI (non-ST elevated myocardial infarction) (HCC) 12/2018   Critical left main and proximal circumflex disease--CABG x2 (Dr. Tyrone Sage.  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 A1c 6.4% 07/2021. 6.2% Nov 2023   Sleep apnea     Past Surgical History:  Procedure Laterality Date   COLONOSCOPY  1998 & 2013   Dr Marina Goodell; 2 benign polyps 2013.  04/2022 NO POLYPS->no further screening colonoscopies needed   CORONARY ARTERY BYPASS GRAFT N/A 12/08/2018   Procedure: CORONARY ARTERY BYPASS GRAFTING (CABG) x 2, SVG TO  OM1, LIMA TO LAD, USING LEFT INTERNAL MAMMARY ARTERY AND RIGHT GREATER SAPHENOUS VEIN HARVESTED ENDOSCOPICALLY;  Surgeon: Delight Ovens, MD;  Location: [facility] OR;  Service: Open Heart Surgery;  Laterality: N/A;   hydrocoelectomy     LEFT HEART CATH AND CORONARY ANGIOGRAPHY N/A 12/07/2018   Procedure: LEFT HEART CATH AND CORONARY ANGIOGRAPHY;  Surgeon: Corky Crafts, MD;  Location: [facility];; Ost LM 75%-ulcerated, pCx 90%, EF 35-45% --> referred for CABG  nocturnal polysomn  1990s   Sleep lab: no OSA (??)--Dr. Shelle Iron said he needs another sleep study as of 2013   ORBITAL FRACTURE SURGERY     TEE WITHOUT CARDIOVERSION N/A 12/08/2018   Procedure: TRANSESOPHAGEAL ECHOCARDIOGRAM (TEE);  Surgeon: Delight Ovens, MD;  Location: [facility] OR;  Service: Open Heart Surgery;  Laterality: N/A;   TONSILLECTOMY AND ADENOIDECTOMY     TRANSTHORACIC ECHOCARDIOGRAM  12/07/2018   In setting of non-STEMI: EF 55-60%.  GR 1 DD.  Mild inferior-inferolateral and apical lateral HK.  Normal RV.  Normal valves   UPPER GI ENDOSCOPY  2000 & 2011   dilation 2000    Family History   Problem Relation Age of Onset   Cancer Mother        Gynecologic   COPD Mother    Heart attack Father 44        CHF   Heart attack Maternal Grandfather        > 55   Heart attack Paternal Grandfather        >55   Colon cancer Neg Hx    Stomach cancer Neg Hx    Diabetes Neg Hx    Stroke Neg Hx    Colon polyps Neg Hx    Esophageal cancer Neg Hx    Rectal cancer Neg Hx     Social History   Socioeconomic History   Marital status: Married    Spouse name: Not on file   Number of children: 1   Years of education: Not on file   Highest education level: Associate degree: occupational, Scientist, product/process development, or vocational program  Occupational History   Not on file  Tobacco Use   Smoking status: Never   Smokeless tobacco: Never  Vaping Use   Vaping status: Never Used  Substance and Sexual Activity   Alcohol use: Not Currently    Alcohol/week: 1.0 standard drink of alcohol    Types: 1 Cans of beer per week    Comment:  2 beers/ month   Drug use: No   Sexual activity: Yes  Other Topics Concern   Not on file  Social History Narrative   Married, 1 son.   Lives in [place].   Tax adviser -self-employed   No tob, no alc, no drugs.      Social Determinants of Health   Financial Resource Strain: Low Risk  (05/27/2023)   Overall Financial Resource Strain (CARDIA)    Difficulty of Paying Living Expenses: Not hard at all  Food Insecurity: No Food Insecurity (05/27/2023)   Hunger Vital Sign    Worried About Running Out of Food in the Last Year: Never true    Ran Out of Food in the Last Year: Never true  Transportation Needs: No Transportation Needs (05/27/2023)   PRAPARE - Administrator, Civil Service (Medical): No    Lack of Transportation (Non-Medical): No  Physical Activity: Sufficiently Active (05/27/2023)   Exercise Vital Sign    Days of Exercise per Week: 4 days    Minutes of Exercise per Session: 60 min  Stress: No Stress Concern Present  (05/27/2023)   [employer] of Occupational Health - Occupational Stress Questionnaire    Feeling of Stress : Not at all  Social Connections: Socially Integrated (05/27/2023)   Social Connection and Isolation Panel [NHANES]    Frequency of Communication with Friends and Family: Twice a week    Frequency of Social Gatherings with Friends and Family: Once a week  Attends Religious Services: More than 4 times per year    Active Member of Clubs or Organizations: Yes    Attends Banker Meetings: More than 4 times per year    Marital Status: Married  Catering manager Violence: Not At Risk (05/27/2023)   Humiliation, Afraid, Rape, and Kick questionnaire    Fear of Current or Ex-Partner: No    Emotionally Abused: No    Physically Abused: No    Sexually Abused: No    Outpatient Medications Prior to Visit  Medication Sig Dispense Refill   aspirin EC 81 MG tablet Take 81 mg by mouth daily.     ezetimibe (ZETIA) 10 MG tablet TAKE 1 TABLET (10 MG TOTAL) BY MOUTH DAILY. PT MUST KEEP UPCOMING APPT IN MAY FOR FURTHER REFILLS 90 tablet 2   metoprolol tartrate (LOPRESSOR) 25 MG tablet TAKE 1 TABLET BY MOUTH TWICE A DAY 180 tablet 3   Multiple Vitamins-Minerals (MULTIVITAMIN MEN PO) Take by mouth daily.     omeprazole (PRILOSEC) 20 MG capsule Take 20 mg by mouth daily.     rosuvastatin (CRESTOR) 20 MG tablet TAKE 1 TABLET DAILY MONDAY WEDNESDAY FRIDAY, 2 TABLETS DAILY TUESDAY, THURSDAY, SATURDAY, SUNDAY. 138 tablet 3   silodosin (RAPAFLO) 8 MG CAPS capsule Take 8 mg by mouth daily.     No facility-administered medications prior to visit.    Allergies  Allergen Reactions   Sulfonamide Derivatives     REACTION: rash Because of a history of documented adverse serious drug reaction;Medi Alert bracelet  is recommended    Review of Systems  Constitutional:  Negative for appetite change, chills, fatigue and fever.  HENT:  Negative for congestion, dental problem, ear pain and sore  throat.   Eyes:  Negative for discharge, redness and visual disturbance.  Respiratory:  Negative for cough, chest tightness, shortness of breath and wheezing.   Cardiovascular:  Negative for chest pain, palpitations and leg swelling.  Gastrointestinal:  Negative for abdominal pain, blood in stool, diarrhea, nausea and vomiting.  Genitourinary:  Negative for difficulty urinating, dysuria, flank pain, frequency, hematuria and urgency.  Musculoskeletal:  Negative for arthralgias, back pain, joint swelling, myalgias and neck stiffness.  Skin:  Negative for pallor and rash.  Neurological:  Negative for dizziness, speech difficulty, weakness and headaches.  Hematological:  Negative for adenopathy. Does not bruise/bleed easily.  Psychiatric/Behavioral:  Negative for confusion and sleep disturbance. The patient is not nervous/anxious.     PE;    07/27/2023    9:36 AM 06/22/2023    9:55 AM 05/27/2023   11:03 AM  Vitals with BMI  Height 5\' 10"  5\' 11"    Weight 225 lbs 6 oz 229 lbs 229 lbs  BMI 32.34 31.95   Systolic 118    Diastolic 72    Pulse 62     Gen: Alert, well appearing.  Patient is oriented to person, place, time, and situation. AFFECT: pleasant, lucid thought and speech. ENT: Ears: EACs clear, normal epithelium.  TMs with good light reflex and landmarks bilaterally.  Eyes: no injection, icteris, swelling, or exudate.  EOMI, PERRLA. Nose: no drainage or turbinate edema/swelling.  No injection or focal lesion.  Mouth: lips without lesion/swelling.  Oral mucosa pink and moist.  Dentition intact and without obvious caries or gingival swelling.  Oropharynx without erythema, exudate, or swelling.  Neck: supple/nontender.  No LAD, mass, or TM.  Carotid pulses 2+ bilaterally, without bruits. CV: RRR, no m/r/g.   LUNGS: CTA bilat, nonlabored resps, good  aeration in all lung fields. ABD: soft, NT, ND, BS normal.  No hepatospenomegaly or mass.  No bruits. EXT: no clubbing, cyanosis, or edema.   Musculoskeletal: no joint swelling, erythema, warmth, or tenderness.  ROM of all joints intact. Skin - no sores or suspicious lesions or rashes or color changes  Pertinent labs:  Lab Results  Component Value Date   TSH 1.87 07/23/2022   Lab Results  Component Value Date   WBC 8.0 07/23/2022   HGB 14.8 07/23/2022   HCT 43.4 07/23/2022   MCV 91.2 07/23/2022   PLT 189 07/23/2022   Lab Results  Component Value Date   CREATININE 0.82 01/27/2023   BUN 21 01/27/2023   NA 140 01/27/2023   K 4.2 01/27/2023   CL 108 01/27/2023   CO2 24 01/27/2023   Lab Results  Component Value Date   ALT 29 01/27/2023   AST 24 01/27/2023   ALKPHOS 61 01/27/2023   BILITOT 0.6 01/27/2023   Lab Results  Component Value Date   CHOL 108 01/27/2023   Lab Results  Component Value Date   HDL 46.60 01/27/2023   Lab Results  Component Value Date   LDLCALC 43 01/27/2023   Lab Results  Component Value Date   TRIG 92.0 01/27/2023   Lab Results  Component Value Date   CHOLHDL 2 01/27/2023   Lab Results  Component Value Date   PSA 2.46 07/23/2022   PSA 2.35 07/20/2020   PSA 2.13 07/18/2019   Lab Results  Component Value Date   HGBA1C 5.5 01/21/2023   HGBA1C 5.5 01/21/2023   HGBA1C 5.5 (A) 01/21/2023   HGBA1C 5.5 01/21/2023   Lab Results  Component Value Date   TESTOSTERONE 352.85 06/20/2015   ASSESSMENT AND PLAN:   No problem-specific Assessment & Plan notes found for this encounter.  Health maintenance exam: Reviewed age and gender appropriate health maintenance issues (prudent diet, regular exercise, health risks of tobacco and excessive alcohol, use of seatbelts, fire alarms in home, use of sunscreen).  Also reviewed age and gender appropriate health screening as well as vaccine recommendations. Vaccines:  ALL UTD. Labs: cbc, cmet, flp, Hba1c (prediabetes) Prostate ca screening: hx elevated PSA-->PSA today. Colon ca screening: Normal colonoscopy 05/06/2022.  No further  screening colonoscopies needed per GI.  2 prediabetes, doing great on diet and exercise. Fasting glucose and hemoglobin A1c obtained today.   #3 hypercholesterolemia, doing well on Zetia 10 mg a day and rosuvastatin 20 mg daily on Monday Wednesday and Friday and 40 mg on Tuesday Thursday Saturday and Sunday. Lipid panel and hepatic panel today.  #4 GERD, chronic daily Prilosec 20 mg use. Monitor for vitamin B12 deficiency.  #5 coronary artery disease. Asymptomatic. Continue aspirin, statin, Zetia, and beta-blocker. His cardiologist plans myocardial perfusion imaging on 01/28/2023.   An After Visit Summary was printed and given to the patient.  FOLLOW UP:  No follow-ups on file.  Signed:  Phil Danaya Geddis, MD           11 /18/2024

## 2023-07-27 ENCOUNTER — Ambulatory Visit: Payer: Medicare HMO | Admitting: Family Medicine

## 2023-07-27 ENCOUNTER — Encounter: Payer: Self-pay | Admitting: Family Medicine

## 2023-07-27 VITALS — BP 118/72 | HR 62 | Ht 70.0 in | Wt 225.4 lb

## 2023-07-27 DIAGNOSIS — R7303 Prediabetes: Secondary | ICD-10-CM | POA: Diagnosis not present

## 2023-07-27 DIAGNOSIS — E7800 Pure hypercholesterolemia, unspecified: Secondary | ICD-10-CM

## 2023-07-27 DIAGNOSIS — Z0000 Encounter for general adult medical examination without abnormal findings: Secondary | ICD-10-CM | POA: Diagnosis not present

## 2023-07-27 DIAGNOSIS — Z125 Encounter for screening for malignant neoplasm of prostate: Secondary | ICD-10-CM

## 2023-07-27 DIAGNOSIS — K909 Intestinal malabsorption, unspecified: Secondary | ICD-10-CM

## 2023-07-27 LAB — COMPREHENSIVE METABOLIC PANEL
ALT: 33 U/L (ref 0–53)
AST: 24 U/L (ref 0–37)
Albumin: 4.4 g/dL (ref 3.5–5.2)
Alkaline Phosphatase: 66 U/L (ref 39–117)
BUN: 16 mg/dL (ref 6–23)
CO2: 28 meq/L (ref 19–32)
Calcium: 9.4 mg/dL (ref 8.4–10.5)
Chloride: 106 meq/L (ref 96–112)
Creatinine, Ser: 0.94 mg/dL (ref 0.40–1.50)
GFR: 82.32 mL/min (ref >60.00)
Glucose, Bld: 108 mg/dL — ABNORMAL HIGH (ref 70–99)
Potassium: 4.8 meq/L (ref 3.5–5.1)
Sodium: 142 meq/L (ref 135–145)
Total Bilirubin: 0.5 mg/dL (ref 0.2–1.2)
Total Protein: 6.5 g/dL (ref 6.0–8.3)

## 2023-07-27 LAB — LIPID PANEL
Cholesterol: 128 mg/dL (ref 0–200)
HDL: 46.8 mg/dL (ref >39.00)
LDL Cholesterol: 58 mg/dL (ref 0–99)
NonHDL: 81.4
Total CHOL/HDL Ratio: 3
Triglycerides: 118 mg/dL (ref 0.0–149.0)
VLDL: 23.6 mg/dL (ref 0.0–40.0)

## 2023-07-27 LAB — CBC
HCT: 46.3 % (ref 39.0–52.0)
Hemoglobin: 15.1 g/dL (ref 13.0–17.0)
MCHC: 32.6 g/dL (ref 30.0–36.0)
MCV: 95.6 fL (ref 78.0–100.0)
Platelets: 182 10*3/uL (ref 150.0–400.0)
RBC: 4.84 Mil/uL (ref 4.22–5.81)
RDW: 13.2 % (ref 11.5–15.5)
WBC: 6.1 10*3/uL (ref 4.0–10.5)

## 2023-07-27 LAB — VITAMIN B12: Vitamin B-12: 292 pg/mL (ref 211–911)

## 2023-07-27 LAB — HEMOGLOBIN A1C: Hgb A1c MFr Bld: 6.2 % (ref 4.6–6.5)

## 2023-07-27 LAB — PSA, MEDICARE: PSA: 2.82 ng/mL (ref 0.10–4.00)

## 2023-07-27 NOTE — Patient Instructions (Signed)
Health Maintenance, Male Adopting a healthy lifestyle and getting preventive care are important in promoting health and wellness. Ask your health care provider about: The right schedule for you to have regular tests and exams. Things you can do on your own to prevent diseases and keep yourself healthy. What should I know about diet, weight, and exercise? Eat a healthy diet  Eat a diet that includes plenty of vegetables, fruits, low-fat dairy products, and lean protein. Do not eat a lot of foods that are high in solid fats, added sugars, or sodium. Maintain a healthy weight Body mass index (BMI) is a measurement that can be used to identify possible weight problems. It estimates body fat based on height and weight. Your health care provider can help determine your BMI and help you achieve or maintain a healthy weight. Get regular exercise Get regular exercise. This is one of the most important things you can do for your health. Most adults should: Exercise for at least 150 minutes each week. The exercise should increase your heart rate and make you sweat (moderate-intensity exercise). Do strengthening exercises at least twice a week. This is in addition to the moderate-intensity exercise. Spend less time sitting. Even light physical activity can be beneficial. Watch cholesterol and blood lipids Have your blood tested for lipids and cholesterol at 70 years of age, then have this test every 5 years. You may need to have your cholesterol levels checked more often if: Your lipid or cholesterol levels are high. You are older than 70 years of age. You are at high risk for heart disease. What should I know about cancer screening? Many types of cancers can be detected early and may often be prevented. Depending on your health history and family history, you may need to have cancer screening at various ages. This may include screening for: Colorectal cancer. Prostate cancer. Skin cancer. Lung  cancer. What should I know about heart disease, diabetes, and high blood pressure? Blood pressure and heart disease High blood pressure causes heart disease and increases the risk of stroke. This is more likely to develop in people who have high blood pressure readings or are overweight. Talk with your health care provider about your target blood pressure readings. Have your blood pressure checked: Every 3-5 years if you are 18-39 years of age. Every year if you are 40 years old or older. If you are between the ages of 65 and 75 and are a current or former smoker, ask your health care provider if you should have a one-time screening for abdominal aortic aneurysm (AAA). Diabetes Have regular diabetes screenings. This checks your fasting blood sugar level. Have the screening done: Once every three years after age 45 if you are at a normal weight and have a low risk for diabetes. More often and at a younger age if you are overweight or have a high risk for diabetes. What should I know about preventing infection? Hepatitis B If you have a higher risk for hepatitis B, you should be screened for this virus. Talk with your health care provider to find out if you are at risk for hepatitis B infection. Hepatitis C Blood testing is recommended for: Everyone born from 1945 through 1965. Anyone with known risk factors for hepatitis C. Sexually transmitted infections (STIs) You should be screened each year for STIs, including gonorrhea and chlamydia, if: You are sexually active and are younger than 70 years of age. You are older than 70 years of age and your   health care provider tells you that you are at risk for this type of infection. Your sexual activity has changed since you were last screened, and you are at increased risk for chlamydia or gonorrhea. Ask your health care provider if you are at risk. Ask your health care provider about whether you are at high risk for HIV. Your health care provider  may recommend a prescription medicine to help prevent HIV infection. If you choose to take medicine to prevent HIV, you should first get tested for HIV. You should then be tested every 3 months for as long as you are taking the medicine. Follow these instructions at home: Alcohol use Do not drink alcohol if your health care provider tells you not to drink. If you drink alcohol: Limit how much you have to 0-2 drinks a day. Know how much alcohol is in your drink. In the [place] one drink equals one 12 oz bottle of beer (355 mL), one 5 oz glass of wine (148 mL), or one 1 oz glass of hard liquor (44 mL). Lifestyle Do not use any products that contain nicotine or tobacco. These products include cigarettes, chewing tobacco, and vaping devices, such as e-cigarettes. If you need help quitting, ask your health care provider. Do not use street drugs. Do not share needles. Ask your health care provider for help if you need support or information about quitting drugs. General instructions Schedule regular health, dental, and eye exams. Stay current with your vaccines. Tell your health care provider if: You often feel depressed. You have ever been abused or do not feel safe at home. Summary Adopting a healthy lifestyle and getting preventive care are important in promoting health and wellness. Follow your health care provider's instructions about healthy diet, exercising, and getting tested or screened for diseases. Follow your health care provider's instructions on monitoring your cholesterol and blood pressure. This information is not intended to replace advice given to you by your health care provider. Make sure you discuss any questions you have with your health care provider. Document Revised: 01/14/2021 Document Reviewed: 01/14/2021 Elsevier Patient Education  2024 [employer]  

## 2023-08-26 ENCOUNTER — Telehealth (INDEPENDENT_AMBULATORY_CARE_PROVIDER_SITE_OTHER): Payer: Self-pay | Admitting: Otolaryngology

## 2023-08-26 NOTE — Telephone Encounter (Signed)
Calling patient to schedule 89yr follow up with Dr. Allena Katz. Left VM for return call.

## 2024-01-17 ENCOUNTER — Other Ambulatory Visit: Payer: Self-pay | Admitting: Cardiology

## 2024-01-19 ENCOUNTER — Encounter (HOSPITAL_COMMUNITY): Payer: Self-pay

## 2024-01-22 ENCOUNTER — Telehealth (HOSPITAL_COMMUNITY): Payer: Self-pay | Admitting: *Deleted

## 2024-01-22 ENCOUNTER — Other Ambulatory Visit: Payer: Self-pay | Admitting: Cardiology

## 2024-01-22 DIAGNOSIS — I214 Non-ST elevation (NSTEMI) myocardial infarction: Secondary | ICD-10-CM

## 2024-01-22 DIAGNOSIS — Z951 Presence of aortocoronary bypass graft: Secondary | ICD-10-CM

## 2024-01-22 DIAGNOSIS — E785 Hyperlipidemia, unspecified: Secondary | ICD-10-CM

## 2024-01-22 DIAGNOSIS — I2510 Atherosclerotic heart disease of native coronary artery without angina pectoris: Secondary | ICD-10-CM

## 2024-01-22 NOTE — Telephone Encounter (Signed)
 Left detailed instructions for MPI study.

## 2024-01-28 ENCOUNTER — Ambulatory Visit (HOSPITAL_COMMUNITY): Payer: Medicare HMO | Attending: Cardiovascular Disease

## 2024-01-28 DIAGNOSIS — E785 Hyperlipidemia, unspecified: Secondary | ICD-10-CM | POA: Insufficient documentation

## 2024-01-28 DIAGNOSIS — Z951 Presence of aortocoronary bypass graft: Secondary | ICD-10-CM | POA: Diagnosis not present

## 2024-01-28 DIAGNOSIS — I2510 Atherosclerotic heart disease of native coronary artery without angina pectoris: Secondary | ICD-10-CM | POA: Insufficient documentation

## 2024-01-28 DIAGNOSIS — I214 Non-ST elevation (NSTEMI) myocardial infarction: Secondary | ICD-10-CM | POA: Insufficient documentation

## 2024-01-28 DIAGNOSIS — M47819 Spondylosis without myelopathy or radiculopathy, site unspecified: Secondary | ICD-10-CM | POA: Diagnosis not present

## 2024-01-28 LAB — MYOCARDIAL PERFUSION IMAGING
Angina Index: 0
Duke Treadmill Score: -9
Estimated workload: 7
Exercise duration (min): 6 min
Exercise duration (sec): 0 s
LV dias vol: 129 mL (ref 62–150)
LV sys vol: 44 mL
MPHR: 150 {beats}/min
Nuc Stress EF: 66 %
Peak HR: 155 {beats}/min
Percent HR: 103 %
Rest HR: 68 {beats}/min
Rest Nuclear Isotope Dose: 10.2 mCi
SDS: 2
SRS: 2
SSS: 3
ST Depression (mm): 3 mm
Stress Nuclear Isotope Dose: 32.5 mCi
TID: 0.99

## 2024-01-28 MED ORDER — TECHNETIUM TC 99M TETROFOSMIN IV KIT
10.2000 | PACK | Freq: Once | INTRAVENOUS | Status: AC | PRN
  Administered 2024-01-28: 10.2 via INTRAVENOUS

## 2024-01-28 MED ORDER — TECHNETIUM TC 99M TETROFOSMIN IV KIT
32.5000 | PACK | Freq: Once | INTRAVENOUS | Status: AC | PRN
  Administered 2024-01-28: 32.5 via INTRAVENOUS

## 2024-01-29 ENCOUNTER — Ambulatory Visit: Payer: Self-pay | Admitting: Cardiology

## 2024-02-02 ENCOUNTER — Encounter: Payer: Self-pay | Admitting: Family Medicine

## 2024-02-02 ENCOUNTER — Ambulatory Visit: Payer: Self-pay | Admitting: Cardiology

## 2024-03-01 ENCOUNTER — Other Ambulatory Visit: Payer: Self-pay | Admitting: Cardiology

## 2024-03-01 DIAGNOSIS — H52223 Regular astigmatism, bilateral: Secondary | ICD-10-CM | POA: Diagnosis not present

## 2024-03-03 ENCOUNTER — Other Ambulatory Visit: Payer: Self-pay | Admitting: Cardiology

## 2024-03-03 NOTE — Telephone Encounter (Signed)
*  STAT* If patient is at the pharmacy, call can be transferred to refill team.   1. Which medications need to be refilled? (please list name of each medication and dose if known)    rosuvastatin  (CRESTOR ) 20 MG tablet     4. Which pharmacy/location (including street and city if local pharmacy) is medication to be sent to? [id number] - [place], [place] - [address]     5. Do they need a 30 day or 90 day supply? 90   Pt states Dr. Anner told him he doesn't need to f/u anymore.

## 2024-03-03 NOTE — Telephone Encounter (Signed)
 Pt is overdue for an yearly appointment with Dr. Anner. Pt states that Dr. Anner saw him for results for a test and that he did not need to be seen. Does pt need to have an yearly office visit with Dr. Anner first before a 90 day supply? Please address

## 2024-03-07 MED ORDER — ROSUVASTATIN CALCIUM 20 MG PO TABS: ORAL_TABLET | ORAL | 1 refills | Status: DC

## 2024-03-07 NOTE — Telephone Encounter (Signed)
 Pt's medication was sent to pt's pharmacy as requested. Confirmation received.

## 2024-04-15 ENCOUNTER — Other Ambulatory Visit: Payer: Self-pay | Admitting: Cardiology

## 2024-04-27 ENCOUNTER — Ambulatory Visit: Admitting: Cardiology

## 2024-05-10 ENCOUNTER — Other Ambulatory Visit: Payer: Self-pay | Admitting: Cardiology

## 2024-05-25 ENCOUNTER — Other Ambulatory Visit: Payer: Self-pay | Admitting: Cardiology

## 2024-06-01 ENCOUNTER — Ambulatory Visit (INDEPENDENT_AMBULATORY_CARE_PROVIDER_SITE_OTHER): Payer: Medicare HMO | Admitting: *Deleted

## 2024-06-01 VITALS — Ht 70.0 in | Wt 225.0 lb

## 2024-06-01 DIAGNOSIS — Z0000 Encounter for general adult medical examination without abnormal findings: Secondary | ICD-10-CM

## 2024-06-01 NOTE — Patient Instructions (Signed)
 Tyler [name] , Thank you for taking time to come for your Medicare Wellness Visit. I appreciate your ongoing commitment to your health goals. Please review the following plan we discussed and let me know if I can assist you in the future.   Screening recommendations/referrals: Colonoscopy: no longer required Recommended yearly ophthalmology/optometry visit for glaucoma screening and checkup Recommended yearly dental visit for hygiene and checkup  Vaccinations: Influenza vaccine: Education provided Pneumococcal vaccine: up to date Tdap vaccine: up to date Shingles vaccine: up to date        Preventive Care 65 Years and Older, Male Preventive care refers to lifestyle choices and visits with your health care provider that can promote health and wellness. What does preventive care include? A yearly physical exam. This is also called an annual well check. Dental exams once or twice a year. Routine eye exams. Ask your health care provider how often you should have your eyes checked. Personal lifestyle choices, including: Daily care of your teeth and gums. Regular physical activity. Eating a healthy diet. Avoiding tobacco and drug use. Limiting alcohol use. Practicing safe sex. Taking low doses of aspirin  every day. Taking vitamin and mineral supplements as recommended by your health care provider. What happens during an annual well check? The services and screenings done by your health care provider during your annual well check will depend on your age, overall health, lifestyle risk factors, and family history of disease. Counseling  Your health care provider may ask you questions about your: Alcohol use. Tobacco use. Drug use. Emotional well-being. Home and relationship well-being. Sexual activity. Eating habits. History of falls. Memory and ability to understand (cognition). Work and work Astronomer. Screening  You may have the following tests or measurements: Height,  weight, and BMI. Blood pressure. Lipid and cholesterol levels. These may be checked every 5 years, or more frequently if you are over 49 years old. Skin check. Lung cancer screening. You may have this screening every year starting at age 73 if you have a 30-pack-year history of smoking and currently smoke or have quit within the past 15 years. Fecal occult blood test (FOBT) of the stool. You may have this test every year starting at age 49. Flexible sigmoidoscopy or colonoscopy. You may have a sigmoidoscopy every 5 years or a colonoscopy every 10 years starting at age 21. Prostate cancer screening. Recommendations will vary depending on your family history and other risks. Hepatitis C blood test. Hepatitis B blood test. Sexually transmitted disease (STD) testing. Diabetes screening. This is done by checking your blood sugar (glucose) after you have not eaten for a while (fasting). You may have this done every 1-3 years. Abdominal aortic aneurysm (AAA) screening. You may need this if you are a current or former smoker. Osteoporosis. You may be screened starting at age 4 if you are at high risk. Talk with your health care provider about your test results, treatment options, and if necessary, the need for more tests. Vaccines  Your health care provider may recommend certain vaccines, such as: Influenza vaccine. This is recommended every year. Tetanus, diphtheria, and acellular pertussis (Tdap, Td) vaccine. You may need a Td booster every 10 years. Zoster vaccine. You may need this after age 27. Pneumococcal 13-valent conjugate (PCV13) vaccine. One dose is recommended after age 44. Pneumococcal polysaccharide (PPSV23) vaccine. One dose is recommended after age 9. Talk to your health care provider about which screenings and vaccines you need and how often you need them. This information is not  intended to replace advice given to you by your health care provider. Make sure you discuss any  questions you have with your health care provider. Document Released: 09/21/2015 Document Revised: 05/14/2016 Document Reviewed: 06/26/2015 Elsevier Interactive Patient Education  2017 [employer].  Fall Prevention in the Home Falls can cause injuries. They can happen to people of all ages. There are many things you can do to make your home safe and to help prevent falls. What can I do on the outside of my home? Regularly fix the edges of walkways and driveways and fix any cracks. Remove anything that might make you trip as you walk through a door, such as a raised step or threshold. Trim any bushes or trees on the path to your home. Use bright outdoor lighting. Clear any walking paths of anything that might make someone trip, such as rocks or tools. Regularly check to see if handrails are loose or broken. Make sure that both sides of any steps have handrails. Any raised decks and porches should have guardrails on the edges. Have any leaves, snow, or ice cleared regularly. Use sand or salt on walking paths during winter. Clean up any spills in your garage right away. This includes oil or grease spills. What can I do in the bathroom? Use night lights. Install grab bars by the toilet and in the tub and shower. Do not use towel bars as grab bars. Use non-skid mats or decals in the tub or shower. If you need to sit down in the shower, use a plastic, non-slip stool. Keep the floor dry. Clean up any water that spills on the floor as soon as it happens. Remove soap buildup in the tub or shower regularly. Attach bath mats securely with double-sided non-slip rug tape. Do not have throw rugs and other things on the floor that can make you trip. What can I do in the bedroom? Use night lights. Make sure that you have a light by your bed that is easy to reach. Do not use any sheets or blankets that are too big for your bed. They should not hang down onto the floor. Have a firm chair that has side  arms. You can use this for support while you get dressed. Do not have throw rugs and other things on the floor that can make you trip. What can I do in the kitchen? Clean up any spills right away. Avoid walking on wet floors. Keep items that you use a lot in easy-to-reach places. If you need to reach something above you, use a strong step stool that has a grab bar. Keep electrical cords out of the way. Do not use floor polish or wax that makes floors slippery. If you must use wax, use non-skid floor wax. Do not have throw rugs and other things on the floor that can make you trip. What can I do with my stairs? Do not leave any items on the stairs. Make sure that there are handrails on both sides of the stairs and use them. Fix handrails that are broken or loose. Make sure that handrails are as long as the stairways. Check any carpeting to make sure that it is firmly attached to the stairs. Fix any carpet that is loose or worn. Avoid having throw rugs at the top or bottom of the stairs. If you do have throw rugs, attach them to the floor with carpet tape. Make sure that you have a light switch at the top of the stairs  and the bottom of the stairs. If you do not have them, ask someone to add them for you. What else can I do to help prevent falls? Wear shoes that: Do not have high heels. Have rubber bottoms. Are comfortable and fit you well. Are closed at the toe. Do not wear sandals. If you use a stepladder: Make sure that it is fully opened. Do not climb a closed stepladder. Make sure that both sides of the stepladder are locked into place. Ask someone to hold it for you, if possible. Clearly mark and make sure that you can see: Any grab bars or handrails. First and last steps. Where the edge of each step is. Use tools that help you move around (mobility aids) if they are needed. These include: Canes. Walkers. Scooters. Crutches. Turn on the lights when you go into a dark area.  Replace any light bulbs as soon as they burn out. Set up your furniture so you have a clear path. Avoid moving your furniture around. If any of your floors are uneven, fix them. If there are any pets around you, be aware of where they are. Review your medicines with your doctor. Some medicines can make you feel dizzy. This can increase your chance of falling. Ask your doctor what other things that you can do to help prevent falls. This information is not intended to replace advice given to you by your health care provider. Make sure you discuss any questions you have with your health care provider. Document Released: 06/21/2009 Document Revised: 01/31/2016 Document Reviewed: 09/29/2014 Elsevier Interactive Patient Education  2017 [employer].

## 2024-06-01 NOTE — Progress Notes (Signed)
 Subjective:   Tyler [name] is a 73 y.o. male who presents for Medicare Annual/Subsequent preventive examination.  Visit Complete: Virtual I connected with  Tyler [name] on 06/01/24 by a video and audio enabled telemedicine application and verified that I am speaking with the correct person using two identifiers.  Patient Location: Home  Provider Location: Home Office  I discussed the limitations of evaluation and management by telemedicine. The patient expressed understanding and agreed to proceed.  Vital Signs: Because this visit was a virtual/telehealth visit, some criteria may be missing or patient reported. Any vitals not documented were not able to be obtained and vitals that have been documented are patient reported.   Cardiac Risk Factors include: advanced age (>75men, >54 women);male gender;hypertension;obesity (BMI >30kg/m2)     Objective:    Today's Vitals   06/01/24 1057  Weight: 225 lb (102.1 kg)  Height: 5' 10 (1.778 m)   Body mass index is 32.28 kg/m.     06/01/2024   10:58 AM 05/27/2023   11:08 AM 07/23/2022    8:48 PM 05/21/2022   11:37 AM 05/30/2021    1:22 PM 12/06/2018    6:04 PM 12/06/2018    3:44 PM  Advanced Directives  Does Patient Have a Medical Advance Directive? Yes Yes Yes Yes Yes Yes  No   Type of Advance Directive Living will Healthcare Power of [place];Living will Healthcare Power of [place];Living will Healthcare Power of [place];Living will Living will Healthcare Power of [place];Living will   Does patient want to make changes to medical advance directive?   No - Patient declined   No - Patient declined    Copy of Healthcare Power of Attorney in Chart?  No - copy requested No - copy requested No - copy requested  No - copy requested    Would patient like information on creating a medical advance directive?   No - Patient declined         Data saved with a previous flowsheet row definition    Current Medications  (verified) Outpatient Encounter Medications as of 06/01/2024  Medication Sig   aspirin  EC 81 MG tablet Take 81 mg by mouth daily.   ezetimibe  (ZETIA ) 10 MG tablet TAKE 1 TABLET (10 MG TOTAL) BY MOUTH DAILY. PT MUST KEEP UPCOMING APPT IN MAY FOR FURTHER REFILLS   metoprolol  tartrate (LOPRESSOR ) 25 MG tablet TAKE 1 TABLET (25 MG TOTAL) BY MOUTH 2 (TWO) TIMES DAILY. PLEASE KEEP SCHEDULED APPOINTMENT FOR FUTURE REFILLS. THANK YOU.   Multiple Vitamins-Minerals (MULTIVITAMIN MEN PO) Take by mouth daily.   omeprazole  (PRILOSEC ) 20 MG capsule Take 20 mg by mouth daily.   rosuvastatin  (CRESTOR ) 20 MG tablet Take 1 tablet by mouth daily Monday, Wednesday and Friday, take 2 tablets by mouth daily Tuesday, Thursday, Saturday and Sunday.   silodosin (RAPAFLO) 8 MG CAPS capsule Take 8 mg by mouth daily.   No facility-administered encounter medications on file as of 06/01/2024.    Allergies (verified) Sulfonamide derivatives   History: Past Medical History:  Diagnosis Date   BPH (benign prostatic hypertrophy)    Flomax  not helpful 2018/19 per pt report   Chronic pain of right knee    Left knee as well (osteoarthritis).  Dr. Anderson   Colon polyps 2013   NON-adenomatous 2013 and 2023->no further screening   Elevated PSA 07/10/2016   Dr. Chales saw him 07/15/16, did f/u PSA and it was 2.78: f/u was recommended but pt did not do this as of  07/2017.  Repeat PSA here 07/2017 was 3.1, with 19% free (free a little low).  Pt then followed up with Dr. Chales and tx for BPH w/plan of repeat PSA 1 yr.  PSA 07/2018 down to 3.21.   Erectile dysfunction    Urol rx'd sildenafil  20mg  tabs 11/2017.   GERD (gastroesophageal reflux disease)    Hallux limitus 04/2016   1st MPJ joint R foot.  Diclofenac helpful.  Injected by podiatrist 11/2016.   Hyperlipidemia    Dr. Mona   Hypogonadism male    Left main coronary artery disease 12/07/2018   NSTEMI:  ost LM ulcerated 75%, pCx 90%, EF 35-45% --> CABG x2  (LIMA-LAD, SVG-Cx) 12/14/2018.  LV function normal on Intra-Op TEE   NSTEMI (non-ST elevated myocardial infarction) (HCC) 12/2018   Critical left main and proximal circumflex disease--CABG x2 (Dr. Army.  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 A1c 6.4% 07/2021. 6.2% Nov 2023   Sleep apnea    Past Surgical History:  Procedure Laterality Date   CARDIOVASCULAR STRESS TEST     01/28/24 LOW RISK   COLONOSCOPY  1998 & 2013   Dr Tyler [name]; 2 benign polyps 2013.  04/2022 NO POLYPS->no further screening colonoscopies needed   CORONARY ARTERY BYPASS GRAFT N/A 12/08/2018   Procedure: CORONARY ARTERY BYPASS GRAFTING (CABG) x 2, SVG TO  OM1, LIMA TO LAD, USING LEFT INTERNAL MAMMARY ARTERY AND RIGHT GREATER SAPHENOUS VEIN HARVESTED ENDOSCOPICALLY;  Surgeon: Tyler Dallas NOVAK, MD;  Location[facility] OR;  Service: Open Heart Surgery;  Laterality: N/A;   hydrocoelectomy     LEFT HEART CATH AND CORONARY ANGIOGRAPHY N/A 12/07/2018   Procedure: LEFT HEART CATH AND CORONARY ANGIOGRAPHY;  Surgeon: Tyler Candyce RAMAN, MD;  Location: [facility] INVASIVE CV LAB;; Ost LM 75%-ulcerated, pCx 90%, EF 35-45% --> referred for CABG   nocturnal polysomn  1990s   Sleep lab: no OSA (??)--Dr. Corrie said he needs another sleep study as of 2013   ORBITAL FRACTURE SURGERY     TEE WITHOUT CARDIOVERSION N/A 12/08/2018   Procedure: TRANSESOPHAGEAL ECHOCARDIOGRAM (TEE);  Surgeon: Tyler Dallas NOVAK, MD;  Location: [facility] OR;  Service: Open Heart Surgery;  Laterality: N/A;   TONSILLECTOMY AND ADENOIDECTOMY     TRANSTHORACIC ECHOCARDIOGRAM  12/07/2018   In setting of non-STEMI: EF 55-60%.  GR 1 DD.  Mild inferior-inferolateral and apical lateral HK.  Normal RV.  Normal valves   UPPER GI ENDOSCOPY  2000 & 2011   dilation 2000   Family History  Problem Relation Age of Onset    Cancer Mother        Gynecologic   COPD Mother    Heart attack Father 81        CHF   Heart attack Maternal Grandfather        > 55   Heart attack Paternal Grandfather        >55   Colon cancer Neg Hx    Stomach cancer Neg Hx    Diabetes Neg Hx    Stroke Neg Hx    Colon polyps Neg Hx    Esophageal cancer Neg Hx    Rectal cancer Neg Hx    Social History   Socioeconomic History   Marital status: Married    Spouse name: Not on file   Number  of children: 1   Years of education: Not on file   Highest education level: Associate degree: occupational, Scientist, product/process development, or vocational program  Occupational History   Not on file  Tobacco Use   Smoking status: Never   Smokeless tobacco: Never  Vaping Use   Vaping status: Never Used  Substance and Sexual Activity   Alcohol use: Not Currently    Alcohol/week: 1.0 standard drink of alcohol    Types: 1 Cans of beer per week    Comment:  2 beers/ month   Drug use: No   Sexual activity: Yes  Other Topics Concern   Not on file  Social History Narrative   Married, 1 son.   Lives in [place].   Tax adviser -self-employed   No tob, no alc, no drugs.      Social Drivers of Corporate investment banker Strain: Low Risk  (06/01/2024)   Overall Financial Resource Strain (CARDIA)    Difficulty of Paying Living Expenses: Not hard at all  Food Insecurity: No Food Insecurity (06/01/2024)   Hunger Vital Sign    Worried About Running Out of Food in the Last Year: Never true    Ran Out of Food in the Last Year: Never true  Transportation Needs: No Transportation Needs (06/01/2024)   PRAPARE - Administrator, Civil Service (Medical): No    Lack of Transportation (Non-Medical): No  Physical Activity: Insufficiently Active (06/01/2024)   Exercise Vital Sign    Days of Exercise per Week: 1 day    Minutes of Exercise per Session: 40 min  Stress: No Stress Concern Present (06/01/2024)   [employer] of  Occupational Health - Occupational Stress Questionnaire    Feeling of Stress: Not at all  Social Connections: Socially Integrated (06/01/2024)   Social Connection and Isolation Panel    Frequency of Communication with Friends and Family: Three times a week    Frequency of Social Gatherings with Friends and Family: Once a week    Attends Religious Services: More than 4 times per year    Active Member of [employer] or Organizations: Yes    Attends Engineer, structural: More than 4 times per year    Marital Status: Married    Tobacco Counseling Counseling given: Not Answered   Clinical Intake:                        Activities of Daily Living    06/01/2024   10:56 AM  In your present state of health, do you have any difficulty performing the following activities:  Hearing? 0  Vision? 0  Difficulty concentrating or making decisions? 0  Walking or climbing stairs? 0  Dressing or bathing? 0  Doing errands, shopping? 0  Preparing Food and eating ? N  Using the Toilet? N  In the past six months, have you accidently leaked urine? N  Do you have problems with loss of bowel control? N  Managing your Medications? N  Managing your Finances? N  Housekeeping or managing your Housekeeping? N    Patient Care Team: Candise Aleene DEL, MD as PCP - General (Family Medicine) Anner Alm ORN, MD as PCP - Cardiology (Cardiology) Tyler [name] Norleen SAILOR, MD as Consulting Physician (Gastroenterology) Mannam, Praveen, MD as Consulting Physician (Pulmonary Disease) Loreda Hacker, DPM as Consulting Physician (Podiatry) Tyler [name] Idol, MD as Consulting Physician (Urology) Tyler [name] Maude ORN, MD (Inactive) as Consulting Physician (Orthopedic Surgery) Carolee Sherwood JONETTA DOUGLAS, MD  as Consulting Physician (Urology) Tyler Dallas NOVAK, MD (Inactive) as Consulting Physician (Cardiothoracic Surgery) Duke, Jon Garre, PA as Physician Assistant (Cardiology) Tyler [name] Vinie BROCKS, MD as Consulting  Physician (Cardiology)  Indicate any recent Medical Services you may have received from other than [facility] providers in the past year (date may be approximate).     Assessment:   This is a routine wellness examination for Major.  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  Has hearing aids  does not wear often Vision Screening - Comments:: Up to date [place]      In Focus   Goals Addressed             This Visit's Progress    Increase physical activity   On track    Patient Stated   On track    Maintain health      Patient Stated   On track    Continue to stay healthy and active      Weight (lb) < 200 lb (90.7 kg)   225 lb (102.1 kg)      Depression Screen    06/01/2024   11:02 AM 05/27/2023   11:07 AM 01/21/2023   10:08 AM 07/23/2022    8:42 AM 05/21/2022   11:35 AM 07/22/2021   10:06 AM 05/30/2021    1:32 PM  PHQ 2/9 Scores  PHQ - 2 Score 0 0 0 0 0 0 0  PHQ- 9 Score 0  0        Fall Risk    06/01/2024   10:53 AM 05/27/2023    9:36 AM 01/21/2023   10:08 AM 01/20/2023   11:26 PM 07/23/2022    8:42 AM  Fall Risk   Falls in the past year? 0 0 0 0 0  Number falls in past yr: 0 0   0  Injury with Fall? 0 0   0  Risk for fall due to :  Impaired vision   No Fall Risks  Follow up Falls evaluation completed;Education provided;Falls prevention discussed Falls prevention discussed   Falls evaluation completed      Data saved with a previous flowsheet row definition    MEDICARE RISK AT HOME: Medicare Risk at Home Any stairs in or around the home?: No If so, are there any without handrails?: No Home free of loose throw rugs in walkways, pet beds, electrical cords, etc?: Yes Adequate lighting in your home to reduce risk of falls?: Yes Life alert?: No Use of a cane, walker or w/c?: No Grab bars in the bathroom?: Yes Shower chair or bench in shower?: Yes Elevated toilet seat or a handicapped toilet?: Yes  TIMED UP AND GO:  Was the test  performed?  No    Cognitive Function:        06/01/2024   10:57 AM 05/27/2023    9:40 AM 05/21/2022   11:43 AM  6CIT Screen  What Year? 0 points 0 points 0 points  What month? 0 points 0 points 0 points  What time? 0 points 0 points 0 points  Count back from 20 0 points 0 points 0 points  Months in reverse 0 points 0 points 0 points  Repeat phrase 0 points 0 points 0 points  Total Score 0 points 0 points 0 points    Immunizations Immunization History  Administered Date(s) Administered   Fluad Quad(high Dose 65+) 06/03/2019, 07/20/2020, 06/20/2021, 06/23/2022   Fluad Trivalent(High Dose 65+) 06/29/2023   INFLUENZA, HIGH  DOSE SEASONAL PF 06/22/2018   Influenza Split 07/09/2011, 06/14/2012   Influenza Whole 07/09/2007, 07/05/2010   Influenza,inj,Quad PF,6+ Mos 06/14/2013, 06/19/2014, 06/20/2015, 07/04/2016, 06/25/2017   PFIZER(Purple Top)SARS-COV-2 Vaccination 09/28/2019, 10/19/2019, 06/16/2020   Pneumococcal Conjugate-13 07/18/2019   Pneumococcal Polysaccharide-23 07/14/2018   Td 10/18/2010   Tdap 08/07/2021   Zoster Recombinant(Shingrix) 09/13/2018, 03/08/2019   Zoster, Live 06/22/2013    TDAP status: Up to date  Flu Vaccine status: Due, Education has been provided regarding the importance of this vaccine. Advised may receive this vaccine at local pharmacy or Health Dept. Aware to provide a copy of the vaccination record if obtained from local pharmacy or Health Dept. Verbalized acceptance and understanding.  Pneumococcal vaccine status: Up to date  Covid-19 vaccine status: Information provided on how to obtain vaccines.   Qualifies for Shingles Vaccine? No   Zostavax completed Yes   Shingrix Completed?: Yes  Screening Tests Health Maintenance  Topic Date Due   Influenza Vaccine  04/08/2024   Medicare Annual Wellness (AWV)  06/01/2025   DTaP/Tdap/Td (3 - Td or Tdap) 08/08/2031   Colonoscopy  05/06/2032   Pneumococcal Vaccine: 50+ Years  Completed   Hepatitis C  Screening  Completed   Zoster Vaccines- Shingrix  Completed   HPV VACCINES  Aged Out   Meningococcal B Vaccine  Aged Out   COVID-19 Vaccine  Discontinued    Health Maintenance  Health Maintenance Due  Topic Date Due   Influenza Vaccine  04/08/2024    Colorectal cancer screening: No longer required.   Lung Cancer Screening: (Low Dose CT Chest recommended if Age 41-80 years, 20 pack-year currently smoking OR have quit w/in 15years.) does not qualify.   Lung Cancer Screening Referral:   Additional Screening:  Hepatitis C Screening: does not qualify; Completed 2017  Vision Screening: Recommended annual ophthalmology exams for early detection of glaucoma and other disorders of the eye. Is the patient up to date with their annual eye exam?  Yes  Who is the provider or what is the name of the office in which the patient attends annual eye exams? [facility] If pt is not established with a provider, would they like to be referred to a provider to establish care? No .   Dental Screening: Recommended annual dental exams for proper oral hygiene    Community Resource Referral / Chronic Care Management: CRR required this visit?  No   CCM required this visit?  No     Plan:     I have personally reviewed and noted the following in the patient's chart:   Medical and social history Use of alcohol, tobacco or illicit drugs  Current medications and supplements including opioid prescriptions. Patient is not currently taking opioid prescriptions. Functional ability and status Nutritional status Physical activity Advanced directives List of other physicians Hospitalizations, surgeries, and ER visits in previous 12 months Vitals Screenings to include cognitive, depression, and falls Referrals and appointments  In addition, I have reviewed and discussed with patient certain preventive protocols, quality metrics, and best practice recommendations. A written personalized care plan for  preventive services as well as general preventive health recommendations were provided to patient.     Mliss Graff, LPN   [id number]   After Visit Summary: (MyChart) Due to this being a telephonic visit, the after visit summary with patients personalized plan was offered to patient via MyChart   Nurse Notes:

## 2024-06-02 ENCOUNTER — Ambulatory Visit: Attending: Physician Assistant | Admitting: Physician Assistant

## 2024-06-02 VITALS — BP 116/62 | HR 64 | Ht 71.75 in

## 2024-06-02 DIAGNOSIS — E785 Hyperlipidemia, unspecified: Secondary | ICD-10-CM

## 2024-06-02 DIAGNOSIS — I2510 Atherosclerotic heart disease of native coronary artery without angina pectoris: Secondary | ICD-10-CM | POA: Diagnosis not present

## 2024-06-02 DIAGNOSIS — I25810 Atherosclerosis of coronary artery bypass graft(s) without angina pectoris: Secondary | ICD-10-CM | POA: Diagnosis not present

## 2024-06-02 DIAGNOSIS — I6523 Occlusion and stenosis of bilateral carotid arteries: Secondary | ICD-10-CM | POA: Diagnosis not present

## 2024-06-02 DIAGNOSIS — I214 Non-ST elevation (NSTEMI) myocardial infarction: Secondary | ICD-10-CM

## 2024-06-02 NOTE — Patient Instructions (Addendum)
 Medication Instructions:  NO CHANGES  Lab Work: NONE TO BE DONE TODAY.   Testing/Procedures: Your physician has requested that you have a carotid duplex. This test is an ultrasound of the carotid arteries in your neck. It looks at blood flow through these arteries that supply the brain with blood. Allow one hour for this exam. There are no restrictions or special instructions.  Follow-Up: At [facility], you and your health needs are our priority.  As part of our continuing mission to provide you with exceptional heart care, our providers are all part of one team.  This team includes your primary Cardiologist (physician) and Advanced Practice Providers or APPs (Physician Assistants and Nurse Practitioners) who all work together to provide you with the care you need, when you need it.  Your next appointment:   1 YEAR  Provider:   Alm Clay, MD

## 2024-06-02 NOTE — Progress Notes (Unsigned)
  Cardiology Office Note   Date:  06/02/2024  ID:  MANNIX KROEKER, DOB Jan 28, 1951, MRN [id number] PCP: Candise Aleene DEL, MD  [facility] Providers Cardiologist:  Alm Clay, MD Cardiology APP:  Madie Jon Garre, PA { Click to update primary MD,subspecialty MD or APP then REFRESH:1}    History of Present Illness Tyler [name] is a 73 y.o. male with PMH of CAD s/p CABG x2 in 2020, hyperlipidemia, prediabetes, OSA and history of BPH with elevated PSA.  Coronary catheterization performed in March 2020 showed 70% ostial left main disease and a 90% proximal left circumflex lesion, 25% proximal to mid LAD lesion, EF 35 to 45%.  Echocardiogram at the time showed EF 55 to 60%, mild hypokinesis of the LV entire inferolateral wall, hypokinesis of the apical lateral wall and inferior wall.  He ultimately underwent CABG x 2 with LIMA-LAD and SVG-OM by Dr. Glendale. Since his bypass surgery has he has been doing well.  Myoview  performed on 01/28/2024 showed 3 mm horizontal ST depression in the inferolateral leads, however perfusion study showed no evidence of ischemia.  LVEF 66%.  Overall the study was considered low risk despite abnormal EKG portion.  He is currently on rosuvastatin  20 mg Monday Wednesday Friday and 40 mg on Tuesday Thursday Saturday and Sunday.  Patient presents today for follow-up.  He denies any recent exertional chest pain or shortness of breath.  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  His annual blood work is usually obtained by his PCP.  He is due for annual visit with his PCP in November.  Last year his lipid panel showed fairly controlled cholesterol, LDL was borderline high at 58.  It was 43 on previous check in May 2024.  Patient is aware the recommended LDL goal is less than 55 for patient who has a history of CABG.  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  I plan to obtain a repeat carotid Doppler to make sure his carotid disease has not progressed.  ROS: ***  Studies Reviewed      *** Risk Assessment/Calculations {Does this patient have ATRIAL FIBRILLATION?:[phone]}         Physical Exam VS:  BP 116/62 (BP Location: Right Arm, Patient Position: Sitting, Cuff Size: Large)   Pulse 64   Ht 5' 11.75 (1.822 m)   BMI 30.73 kg/m        Wt Readings from Last 3 Encounters:  06/01/24 225 lb (102.1 kg)  01/28/24 225 lb (102.1 kg)  07/27/23 225 lb 6.4 oz (102.2 kg)    GEN: Well nourished, well developed in no acute distress NECK: No JVD; No carotid bruits CARDIAC: ***RRR, no murmurs, rubs, gallops RESPIRATORY:  Clear to auscultation without rales, wheezing or rhonchi  ABDOMEN: Soft, non-tender, non-distended EXTREMITIES:  No edema; No deformity   ASSESSMENT AND PLAN ***    {Are you ordering a CV Procedure (e.g. stress test, cath, DCCV, TEE, etc)?   Press F2        :[id number]}  Dispo: ***  Signed, Scot Ford, PA

## 2024-06-04 ENCOUNTER — Other Ambulatory Visit: Payer: Self-pay | Admitting: Cardiology

## 2024-08-03 ENCOUNTER — Encounter: Payer: Self-pay | Admitting: Family Medicine

## 2024-08-03 ENCOUNTER — Ambulatory Visit: Admitting: Family Medicine

## 2024-08-03 VITALS — BP 101/64 | HR 66 | Temp 97.3°F | Ht 69.75 in | Wt 228.6 lb

## 2024-08-03 DIAGNOSIS — R7303 Prediabetes: Secondary | ICD-10-CM

## 2024-08-03 DIAGNOSIS — Z0000 Encounter for general adult medical examination without abnormal findings: Secondary | ICD-10-CM

## 2024-08-03 DIAGNOSIS — Z79899 Other long term (current) drug therapy: Secondary | ICD-10-CM

## 2024-08-03 DIAGNOSIS — K219 Gastro-esophageal reflux disease without esophagitis: Secondary | ICD-10-CM | POA: Diagnosis not present

## 2024-08-03 DIAGNOSIS — Z5181 Encounter for therapeutic drug level monitoring: Secondary | ICD-10-CM

## 2024-08-03 DIAGNOSIS — E7800 Pure hypercholesterolemia, unspecified: Secondary | ICD-10-CM

## 2024-08-03 DIAGNOSIS — Z125 Encounter for screening for malignant neoplasm of prostate: Secondary | ICD-10-CM

## 2024-08-03 LAB — LIPID PANEL
Cholesterol: 108 mg/dL (ref 0–200)
HDL: 51.1 mg/dL (ref >39.00)
LDL Cholesterol: 40 mg/dL (ref 0–99)
NonHDL: 56.72
Total CHOL/HDL Ratio: 2
Triglycerides: 85 mg/dL (ref 0.0–149.0)
VLDL: 17 mg/dL (ref 0.0–40.0)

## 2024-08-03 LAB — COMPREHENSIVE METABOLIC PANEL WITH GFR
ALT: 37 U/L (ref 0–53)
AST: 27 U/L (ref 0–37)
Albumin: 4.4 g/dL (ref 3.5–5.2)
Alkaline Phosphatase: 60 U/L (ref 39–117)
BUN: 16 mg/dL (ref 6–23)
CO2: 28 meq/L (ref 19–32)
Calcium: 9.2 mg/dL (ref 8.4–10.5)
Chloride: 107 meq/L (ref 96–112)
Creatinine, Ser: 0.91 mg/dL (ref 0.40–1.50)
GFR: 84.98 mL/min (ref >60.00)
Glucose, Bld: 107 mg/dL — ABNORMAL HIGH (ref 70–99)
Potassium: 4.6 meq/L (ref 3.5–5.1)
Sodium: 140 meq/L (ref 135–145)
Total Bilirubin: 0.7 mg/dL (ref 0.2–1.2)
Total Protein: 6.4 g/dL (ref 6.0–8.3)

## 2024-08-03 LAB — CBC WITH DIFFERENTIAL/PLATELET
Basophils Absolute: 0 K/uL (ref 0.0–0.1)
Basophils Relative: 0.7 % (ref 0.0–3.0)
Eosinophils Absolute: 0.2 K/uL (ref 0.0–0.7)
Eosinophils Relative: 4 % (ref 0.0–5.0)
HCT: 43.8 % (ref 39.0–52.0)
Hemoglobin: 14.7 g/dL (ref 13.0–17.0)
Lymphocytes Relative: 31.4 % (ref 12.0–46.0)
Lymphs Abs: 1.8 K/uL (ref 0.7–4.0)
MCHC: 33.5 g/dL (ref 30.0–36.0)
MCV: 93.2 fl (ref 78.0–100.0)
Monocytes Absolute: 0.6 K/uL (ref 0.1–1.0)
Monocytes Relative: 10.3 % (ref 3.0–12.0)
Neutro Abs: 3 K/uL (ref 1.4–7.7)
Neutrophils Relative %: 53.6 % (ref 43.0–77.0)
Platelets: 174 K/uL (ref 150.0–400.0)
RBC: 4.7 Mil/uL (ref 4.22–5.81)
RDW: 13.4 % (ref 11.5–15.5)
WBC: 5.6 K/uL (ref 4.0–10.5)

## 2024-08-03 LAB — PSA, MEDICARE: PSA: 2.85 ng/mL (ref 0.10–4.00)

## 2024-08-03 LAB — VITAMIN B12: Vitamin B-12: 330 pg/mL (ref 211–911)

## 2024-08-03 NOTE — Patient Instructions (Signed)

## 2024-08-03 NOTE — Progress Notes (Signed)
 Office Note 08/03/2024  CC:  Chief Complaint  Patient presents with   Annual Exam    Pt is fasting   Patient is a 73 y.o. male who is here for annual health maintenance exam and annual follow-up hypercholesterolemia and GERD with chronic PPI use. A/P as of last visit: 1 prediabetes, doing great on diet and exercise. Fasting glucose and hemoglobin A1c obtained today.   #2 hypercholesterolemia, doing well on Zetia  10 mg a day and rosuvastatin  20 mg daily on Monday Wednesday and Friday and 40 mg on Tuesday Thursday Saturday and Sunday. Lipid panel and hepatic panel today.   #3 GERD, chronic daily Prilosec  20 mg use. Monitor for vitamin B12 deficiency.  INTERIM HX: Ron is feel well. He does also yard work. He is in [employer].  No acute concerns.  Past Medical History:  Diagnosis Date   Allergy Since childhood   Seasonal allergies (hay fever)   BPH (benign prostatic hypertrophy)    Flomax  not helpful 2018/19 per pt report   Cataract Around 2017   Not severe enough for surgery   Chronic pain of right knee    Left knee as well (osteoarthritis).  Dr. Anderson   Colon polyps 2013   NON-adenomatous 2013 and 2023->no further screening   Elevated PSA 07/10/2016   Dr. Chales saw him 07/15/16, did f/u PSA and it was 2.78: f/u was recommended but pt did not do this as of 07/2017.  Repeat PSA here 07/2017 was 3.1, with 19% free (free a little low).  Pt then followed up with Dr. Chales and tx for BPH w/plan of repeat PSA 1 yr.  PSA 07/2018 down to 3.21.   Erectile dysfunction    Urol rx'd sildenafil  20mg  tabs 11/2017.   GERD (gastroesophageal reflux disease)    Hallux limitus 04/2016   1st MPJ joint R foot.  Diclofenac helpful.  Injected by podiatrist 11/2016.   Hyperlipidemia    Dr. Mona   Hypogonadism male    Left main coronary artery disease 12/07/2018   NSTEMI:  ost LM ulcerated 75%, pCx 90%, EF 35-45% --> CABG x2 (LIMA-LAD, SVG-Cx) 12/14/2018.  LV  function normal on Intra-Op TEE   NSTEMI (non-ST elevated myocardial infarction) (HCC) 12/2018   Critical left main and proximal circumflex disease--CABG x2 (Dr. Army.  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 A1c 6.4% 07/2021. 6.2% Nov 2023   Sleep apnea     Past Surgical History:  Procedure Laterality Date   CARDIOVASCULAR STRESS TEST     01/28/24 LOW RISK   COLONOSCOPY  1998 & 2013   Dr Abran; 2 benign polyps 2013.  04/2022 NO POLYPS->no further screening colonoscopies needed   CORONARY ARTERY BYPASS GRAFT N/A 12/08/2018   Procedure: CORONARY ARTERY BYPASS GRAFTING (CABG) x 2, SVG TO  OM1, LIMA TO LAD, USING LEFT INTERNAL MAMMARY ARTERY AND RIGHT GREATER SAPHENOUS VEIN HARVESTED ENDOSCOPICALLY;  Surgeon: Army Dallas NOVAK, MD;  Location: [facility] OR;  Service: Open Heart Surgery;  Laterality: N/A;   EYE SURGERY  1983   Right orbital globe / fracture   FRACTURE SURGERY  1983   See Eye surgery   hydrocoelectomy     LEFT HEART CATH AND CORONARY ANGIOGRAPHY N/A 12/07/2018   Procedure: LEFT HEART CATH AND CORONARY ANGIOGRAPHY;  Surgeon: Dann,  Candyce RAMAN, MD;  Location: [facility] INVASIVE CV LAB;; Ost LM 75%-ulcerated, pCx 90%, EF 35-45% --> referred for CABG   nocturnal polysomn  1990s   Sleep lab: no OSA (??)--Dr. Corrie said he needs another sleep study as of 2013   ORBITAL FRACTURE SURGERY     TEE WITHOUT CARDIOVERSION N/A 12/08/2018   Procedure: TRANSESOPHAGEAL ECHOCARDIOGRAM (TEE);  Surgeon: Army Dallas NOVAK, MD;  Location: [facility] OR;  Service: Open Heart Surgery;  Laterality: N/A;   TONSILLECTOMY AND ADENOIDECTOMY     TRANSTHORACIC ECHOCARDIOGRAM  12/07/2018   In setting of non-STEMI: EF 55-60%.  GR 1 DD.  Mild inferior-inferolateral and apical lateral HK.  Normal RV.  Normal valves   UPPER GI ENDOSCOPY  2000 & 2011    dilation 2000    Family History  Problem Relation Age of Onset   Cancer Mother        Gynecologic   COPD Mother    Asthma Mother    Heart attack Father 39        CHF   Arthritis Father    Heart disease Father    Heart attack Maternal Grandfather        > 55   Heart attack Paternal Grandfather        >55   Arthritis Brother    Colon cancer Neg Hx    Stomach cancer Neg Hx    Diabetes Neg Hx    Stroke Neg Hx    Colon polyps Neg Hx    Esophageal cancer Neg Hx    Rectal cancer Neg Hx     Social History   Socioeconomic History   Marital status: Married    Spouse name: Not on file   Number of children: 1   Years of education: Not on file   Highest education level: Associate degree: academic program  Occupational History   Not on file  Tobacco Use   Smoking status: Never   Smokeless tobacco: Never  Vaping Use   Vaping status: Never Used  Substance and Sexual Activity   Alcohol use: Not Currently    Alcohol/week: 1.0 standard drink of alcohol    Comment:  2 beers/ month   Drug use: Never   Sexual activity: Not Currently  Other Topics Concern   Not on file  Social History Narrative   Married, 1 son.   Lives in [place].   Tax adviser -self-employed   No tob, no alc, no drugs.      Social Drivers of Corporate Investment Banker Strain: Low Risk  (08/02/2024)   Overall Financial Resource Strain (CARDIA)    Difficulty of Paying Living Expenses: Not hard at all  Food Insecurity: No Food Insecurity (08/02/2024)   Hunger Vital Sign    Worried About Running Out of Food in the Last Year: Never true    Ran Out of Food in the Last Year: Never true  Transportation Needs: No Transportation Needs (08/02/2024)   PRAPARE - Administrator, Civil Service (Medical): No    Lack of Transportation (Non-Medical): No  Physical Activity: Sufficiently Active (08/02/2024)   Exercise Vital Sign    Days of Exercise per Week: 5 days    Minutes of  Exercise per Session: 140 min  Recent Concern: Physical Activity - Insufficiently Active (06/01/2024)   Exercise Vital Sign    Days of Exercise per Week: 1 day    Minutes of Exercise per Session: 40 min  Stress: No Stress Concern Present (  08/02/2024)   [employer] - Occupational Stress Questionnaire    Feeling of Stress: Not at all  Social Connections: Socially Integrated (08/02/2024)   Social Connection and Isolation Panel    Frequency of Communication with Friends and Family: Three times a week    Frequency of Social Gatherings with Friends and Family: Three times a week    Attends Religious Services: More than 4 times per year    Active Member of Clubs or Organizations: Yes    Attends Banker Meetings: 1 to 4 times per year    Marital Status: Married  Catering Manager Violence: Not At Risk (06/01/2024)   Humiliation, Afraid, Rape, and Kick questionnaire    Fear of Current or Ex-Partner: No    Emotionally Abused: No    Physically Abused: No    Sexually Abused: No    Outpatient Medications Prior to Visit  Medication Sig Dispense Refill   aspirin  EC 81 MG tablet Take 81 mg by mouth daily.     Cyanocobalamin  (VITAMIN B-12 SL) Place 1 tablet under the tongue daily.     ezetimibe  (ZETIA ) 10 MG tablet TAKE 1 TABLET (10 MG TOTAL) BY MOUTH DAILY. PT MUST KEEP UPCOMING APPT IN MAY FOR FURTHER REFILLS 90 tablet 2   metoprolol  tartrate (LOPRESSOR ) 25 MG tablet TAKE 1 TABLET BY MOUTH 2 TIMES DAILY. PLEASE KEEP SCHEDULED APPOINTMENT FOR FUTURE REFILLS. 180 tablet 3   Multiple Vitamins-Minerals (MULTIVITAMIN MEN PO) Take by mouth daily.     omeprazole  (PRILOSEC ) 20 MG capsule Take 20 mg by mouth daily.     rosuvastatin  (CRESTOR ) 20 MG tablet Take 1 tablet by mouth daily Monday, Wednesday and Friday, take 2 tablets by mouth daily Tuesday, Thursday, Saturday and Sunday. 33 tablet 1   silodosin (RAPAFLO) 8 MG CAPS capsule Take 8 mg by mouth daily.     No  facility-administered medications prior to visit.    Allergies  Allergen Reactions   Sulfonamide Derivatives     REACTION: rash Because of a history of documented adverse serious drug reaction;Medi Alert bracelet  is recommended    Review of Systems  Constitutional:  Negative for appetite change, chills, fatigue and fever.  HENT:  Negative for congestion, dental problem, ear pain and sore throat.   Eyes:  Negative for discharge, redness and visual disturbance.  Respiratory:  Negative for cough, chest tightness, shortness of breath and wheezing.   Cardiovascular:  Negative for chest pain, palpitations and leg swelling.  Gastrointestinal:  Negative for abdominal pain, blood in stool, diarrhea, nausea and vomiting.  Genitourinary:  Negative for difficulty urinating, dysuria, flank pain, frequency, hematuria and urgency.  Musculoskeletal:  Negative for arthralgias, back pain, joint swelling, myalgias and neck stiffness.  Skin:  Negative for pallor and rash.  Neurological:  Negative for dizziness, speech difficulty, weakness and headaches.  Hematological:  Negative for adenopathy. Does not bruise/bleed easily.  Psychiatric/Behavioral:  Negative for confusion and sleep disturbance. The patient is not nervous/anxious.     PE;    08/03/2024    8:47 AM 06/02/2024    8:08 AM 06/01/2024   10:57 AM  Vitals with BMI  Height 5' 9.75 5' 11.75 5' 10  Weight 228 lbs 10 oz  225 lbs  BMI 33.02  32.28  Systolic 101 116   Diastolic 64 62   Pulse 66 64    Gen: Alert, well appearing.  Patient is oriented to person, place, time, and situation. AFFECT: pleasant, lucid thought and  speech. ENT: Ears: EACs clear, normal epithelium.  TMs with good light reflex and landmarks bilaterally.  Eyes: no injection, icteris, swelling, or exudate.  EOMI, PERRLA. Nose: no drainage or turbinate edema/swelling.  No injection or focal lesion.  Mouth: lips without lesion/swelling.  Oral mucosa pink and moist.   Dentition intact and without obvious caries or gingival swelling.  Oropharynx without erythema, exudate, or swelling.  Neck: supple/nontender.  No LAD, mass, or TM.  Carotid pulses 2+ bilaterally, without bruits. CV: RRR, no m/r/g.   LUNGS: CTA bilat, nonlabored resps, good aeration in all lung fields. ABD: soft, NT, ND, BS normal.  No hepatospenomegaly or mass.  No bruits. EXT: no clubbing, cyanosis, or edema.  Musculoskeletal: no joint swelling, erythema, warmth, or tenderness.  ROM of all joints intact. Skin - no sores or suspicious lesions or rashes or color changes  Pertinent labs:  Lab Results  Component Value Date   TSH 1.87 07/23/2022   Lab Results  Component Value Date   WBC 6.1 07/27/2023   HGB 15.1 07/27/2023   HCT 46.3 07/27/2023   MCV 95.6 07/27/2023   PLT 182.0 07/27/2023   Lab Results  Component Value Date   CREATININE 0.94 07/27/2023   BUN 16 07/27/2023   NA 142 07/27/2023   K 4.8 07/27/2023   CL 106 07/27/2023   CO2 28 07/27/2023   Lab Results  Component Value Date   ALT 33 07/27/2023   AST 24 07/27/2023   ALKPHOS 66 07/27/2023   BILITOT 0.5 07/27/2023   Lab Results  Component Value Date   CHOL 128 07/27/2023   Lab Results  Component Value Date   HDL 46.80 07/27/2023   Lab Results  Component Value Date   LDLCALC 58 07/27/2023   Lab Results  Component Value Date   TRIG 118.0 07/27/2023   Lab Results  Component Value Date   CHOLHDL 3 07/27/2023   Lab Results  Component Value Date   PSA 2.82 07/27/2023   PSA 2.46 07/23/2022   PSA 2.35 07/20/2020   Lab Results  Component Value Date   HGBA1C 6.2 07/27/2023   Lab Results  Component Value Date   VITAMINB12 292 07/27/2023   ASSESSMENT AND PLAN:   #1 Health maintenance exam: Reviewed age and gender appropriate health maintenance issues (prudent diet, regular exercise, health risks of tobacco and excessive alcohol, use of seatbelts, fire alarms in home, use of sunscreen).  Also  reviewed age and gender appropriate health screening as well as vaccine recommendations. Vaccines:  ALL UTD. Labs: cbc, cmet, flp, Hba1c (prediabetes) Prostate ca screening: hx elevated PSA-->PSA today. Colon ca screening: Normal colonoscopy 05/06/2022.  No further screening colonoscopies needed per GI  #2 prediabetes, doing great on diet and exercise. Fasting glucose and hemoglobin A1c obtained today.   #3 hypercholesterolemia, doing well on Zetia  10 mg a day and rosuvastatin  20 mg daily on Monday Wednesday and Friday and 40 mg on Tuesday Thursday Saturday and Sunday. Lipid panel and hepatic panel today.   #4 GERD, chronic daily Prilosec 20 mg use. Monitor for vitamin B12 deficiency.  An After Visit Summary was printed and given to the patient.  FOLLOW UP:  Return in about 1 year (around 08/04/2025) for annual CPE (fasting.  Signed:  Phil Trystian Crisanto, MD           11 /26/2025

## 2024-08-06 ENCOUNTER — Ambulatory Visit: Payer: Self-pay | Admitting: Family Medicine

## 2024-08-06 DIAGNOSIS — R7301 Impaired fasting glucose: Secondary | ICD-10-CM

## 2024-08-08 ENCOUNTER — Other Ambulatory Visit

## 2024-08-08 ENCOUNTER — Ambulatory Visit: Payer: Self-pay | Admitting: Family Medicine

## 2024-08-08 DIAGNOSIS — R7301 Impaired fasting glucose: Secondary | ICD-10-CM

## 2024-08-08 DIAGNOSIS — Z0000 Encounter for general adult medical examination without abnormal findings: Secondary | ICD-10-CM

## 2024-08-08 LAB — HEMOGLOBIN A1C: Hgb A1c MFr Bld: 6 % (ref 4.6–6.5)

## 2024-09-13 ENCOUNTER — Ambulatory Visit (HOSPITAL_COMMUNITY)
Admission: RE | Admit: 2024-09-13 | Discharge: 2024-09-13 | Disposition: A | Discharge status: Home / Self Care | Source: Ambulatory Visit | Attending: Physician Assistant | Admitting: Physician Assistant

## 2024-09-13 DIAGNOSIS — I25810 Atherosclerosis of coronary artery bypass graft(s) without angina pectoris: Secondary | ICD-10-CM | POA: Insufficient documentation

## 2024-09-13 DIAGNOSIS — E785 Hyperlipidemia, unspecified: Secondary | ICD-10-CM | POA: Insufficient documentation

## 2024-09-13 DIAGNOSIS — I6523 Occlusion and stenosis of bilateral carotid arteries: Secondary | ICD-10-CM | POA: Diagnosis present

## 2024-09-16 ENCOUNTER — Ambulatory Visit: Payer: Self-pay | Admitting: Physician Assistant

## 2024-09-17 ENCOUNTER — Other Ambulatory Visit: Payer: Self-pay | Admitting: Cardiology

## 2025-06-07 ENCOUNTER — Ambulatory Visit

## 2025-08-07 ENCOUNTER — Encounter: Admitting: Family Medicine
